# Patient Record
Sex: Male | Born: 1950 | Race: Black or African American | Hispanic: No | Smoking: Former smoker
Health system: Southern US, Community
[De-identification: ages and names within clinical notes are randomized; demographics above are authoritative.]

## PROBLEM LIST (undated history)

## (undated) DIAGNOSIS — K922 Gastrointestinal hemorrhage, unspecified: Secondary | ICD-10-CM

## (undated) DIAGNOSIS — J45909 Unspecified asthma, uncomplicated: Secondary | ICD-10-CM

## (undated) DIAGNOSIS — R569 Unspecified convulsions: Secondary | ICD-10-CM

## (undated) DIAGNOSIS — D649 Anemia, unspecified: Secondary | ICD-10-CM

## (undated) DIAGNOSIS — S37039A Laceration of unspecified kidney, unspecified degree, initial encounter: Secondary | ICD-10-CM

## (undated) DIAGNOSIS — K921 Melena: Secondary | ICD-10-CM

## (undated) HISTORY — DX: Unspecified convulsions: R56.9

## (undated) HISTORY — DX: Laceration of unspecified kidney, unspecified degree, initial encounter: S37.039A

## (undated) HISTORY — PX: KIDNEY SURGERY: SHX687

## (undated) HISTORY — DX: Gastrointestinal hemorrhage, unspecified: K92.2

## (undated) HISTORY — DX: Melena: K92.1

## (undated) HISTORY — DX: Anemia, unspecified: D64.9

## (undated) HISTORY — DX: Unspecified asthma, uncomplicated: J45.909

---

## 1997-06-29 DIAGNOSIS — S37039A Laceration of unspecified kidney, unspecified degree, initial encounter: Secondary | ICD-10-CM

## 1997-06-29 HISTORY — DX: Laceration of unspecified kidney, unspecified degree, initial encounter: S37.039A

## 2016-12-09 ENCOUNTER — Encounter: Payer: Self-pay | Admitting: Family Medicine

## 2016-12-09 ENCOUNTER — Ambulatory Visit (INDEPENDENT_AMBULATORY_CARE_PROVIDER_SITE_OTHER): Payer: Federal, State, Local not specified - PPO | Admitting: Family Medicine

## 2016-12-09 VITALS — BP 140/72 | HR 87 | Temp 98.1°F | Ht 73.0 in | Wt 161.4 lb

## 2016-12-09 DIAGNOSIS — Z8719 Personal history of other diseases of the digestive system: Secondary | ICD-10-CM

## 2016-12-09 DIAGNOSIS — D62 Acute posthemorrhagic anemia: Secondary | ICD-10-CM | POA: Diagnosis not present

## 2016-12-09 DIAGNOSIS — R55 Syncope and collapse: Secondary | ICD-10-CM | POA: Diagnosis not present

## 2016-12-09 LAB — COMPREHENSIVE METABOLIC PANEL
ALBUMIN: 3.8 g/dL (ref 3.5–5.2)
ALT: 14 U/L (ref 0–53)
AST: 17 U/L (ref 0–37)
Alkaline Phosphatase: 44 U/L (ref 39–117)
BUN: 19 mg/dL (ref 6–23)
CALCIUM: 9.3 mg/dL (ref 8.4–10.5)
CHLORIDE: 106 meq/L (ref 96–112)
CO2: 29 meq/L (ref 19–32)
Creatinine, Ser: 1.16 mg/dL (ref 0.40–1.50)
GFR: 81.1 mL/min (ref >60.00)
Glucose, Bld: 84 mg/dL (ref 70–99)
POTASSIUM: 4.1 meq/L (ref 3.5–5.1)
SODIUM: 140 meq/L (ref 135–145)
Total Bilirubin: 0.4 mg/dL (ref 0.2–1.2)
Total Protein: 6.1 g/dL (ref 6.0–8.3)

## 2016-12-09 LAB — CBC WITH DIFFERENTIAL/PLATELET
BASOS PCT: 0.7 % (ref 0.0–3.0)
Basophils Absolute: 0 10*3/uL (ref 0.0–0.1)
EOS ABS: 0.1 10*3/uL (ref 0.0–0.7)
EOS PCT: 2.1 % (ref 0.0–5.0)
HCT: 26.9 % — ABNORMAL LOW (ref 39.0–52.0)
Lymphocytes Relative: 32.8 % (ref 12.0–46.0)
Lymphs Abs: 1.5 10*3/uL (ref 0.7–4.0)
MCHC: 34.3 g/dL (ref 30.0–36.0)
MCV: 95.7 fl (ref 78.0–100.0)
MONO ABS: 0.4 10*3/uL (ref 0.1–1.0)
Monocytes Relative: 9.3 % (ref 3.0–12.0)
NEUTROS ABS: 2.6 10*3/uL (ref 1.4–7.7)
Neutrophils Relative %: 55.1 % (ref 43.0–77.0)
PLATELETS: 224 10*3/uL (ref 150.0–400.0)
RBC: 2.81 Mil/uL — ABNORMAL LOW (ref 4.22–5.81)
RDW: 15.5 % (ref 11.5–15.5)
WBC: 4.7 10*3/uL (ref 4.0–10.5)

## 2016-12-09 NOTE — Patient Instructions (Signed)
I am glad that you are feeling better!  We are going to get labs for you today- blood, and I will have you do some stool cards at home and return them to the office We will request records from the hospital in [place] I am going to refer you to a gastroenterologist asap for further evaluation If you are feeling weak, have bloody or black stools, or have any vomiting or significant belly pain or dizziness please seek care immediately!
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
## 2016-12-10 ENCOUNTER — Encounter: Payer: Self-pay | Admitting: Physician Assistant

## 2016-12-16 ENCOUNTER — Encounter: Payer: Self-pay | Admitting: Gastroenterology

## 2016-12-16 ENCOUNTER — Other Ambulatory Visit (INDEPENDENT_AMBULATORY_CARE_PROVIDER_SITE_OTHER): Payer: Federal, State, Local not specified - PPO

## 2016-12-16 ENCOUNTER — Ambulatory Visit (INDEPENDENT_AMBULATORY_CARE_PROVIDER_SITE_OTHER): Payer: Federal, State, Local not specified - PPO | Admitting: Physician Assistant

## 2016-12-16 ENCOUNTER — Encounter: Payer: Self-pay | Admitting: Physician Assistant

## 2016-12-16 VITALS — BP 120/74 | HR 72 | Ht 72.0 in | Wt 164.2 lb

## 2016-12-16 DIAGNOSIS — D649 Anemia, unspecified: Secondary | ICD-10-CM | POA: Diagnosis not present

## 2016-12-16 DIAGNOSIS — R55 Syncope and collapse: Secondary | ICD-10-CM | POA: Insufficient documentation

## 2016-12-16 DIAGNOSIS — R195 Other fecal abnormalities: Secondary | ICD-10-CM | POA: Diagnosis not present

## 2016-12-16 LAB — CBC WITH DIFFERENTIAL/PLATELET
Basophils Absolute: 0 10*3/uL (ref 0.0–0.1)
Basophils Relative: 0.6 % (ref 0.0–3.0)
EOS PCT: 3.1 % (ref 0.0–5.0)
Eosinophils Absolute: 0.2 10*3/uL (ref 0.0–0.7)
HCT: 30.6 % — ABNORMAL LOW (ref 39.0–52.0)
HEMOGLOBIN: 10.4 g/dL — AB (ref 13.0–17.0)
LYMPHS ABS: 1.9 10*3/uL (ref 0.7–4.0)
LYMPHS PCT: 34.4 % (ref 12.0–46.0)
MCHC: 34 g/dL (ref 30.0–36.0)
MCV: 95.9 fl (ref 78.0–100.0)
MONOS PCT: 7.7 % (ref 3.0–12.0)
Monocytes Absolute: 0.4 10*3/uL (ref 0.1–1.0)
Neutro Abs: 3 10*3/uL (ref 1.4–7.7)
Neutrophils Relative %: 54.2 % (ref 43.0–77.0)
Platelets: 300 10*3/uL (ref 150.0–400.0)
RBC: 3.19 Mil/uL — AB (ref 4.22–5.81)
RDW: 15.7 % — ABNORMAL HIGH (ref 11.5–15.5)
WBC: 5.5 10*3/uL (ref 4.0–10.5)

## 2016-12-16 MED ORDER — NA SULFATE-K SULFATE-MG SULF 17.5-3.13-1.6 GM/177ML PO SOLN: 1.0000 | Freq: Once | ORAL | 0 refills | Status: AC

## 2016-12-16 NOTE — Progress Notes (Signed)
Subjective:    Patient ID: Troy Johnson, male    DOB: [birth year], 66 y.o.   MRN: [id number]  HPI Troy Johnson is a pleasant 66 year old African-American male, new to GI today referred by Dr. Silvestre Mesi for evaluation of anemia. Patient has not had any prior GI evaluation. He just established care with Dr. Edilia Bo. He was visiting his daughter in [place] the week of June 8 and apparently had a syncopal episode on standing immediately after dinner. He apparently was witnessed to have loss of consciousness and then vomited. On admission he was noted to be anemic with a hemoglobin of 8.6. There was no evidence of overt GI bleeding but CT scan of the abdomen and pelvis done in [place] by Dr. Arlyn Dunning note showed possible proctitis and question inflammation of the seminal vesicles. Patient says he did not vomit any blood and had no obvious blood in his stool at any point prior to that hospitalization or since. He denies any melena. He was transfused 1 unit of packed RBCs and had hemoglobin of 9.4 on discharge from the hospital there. He did not have GI evaluation. He did have an echo done which was read as normal. Iron studies done during hospitalization showed an iron of 134 TIBC of 213 iron sat of 46 and a ferritin slightly low at 25. Since returning home he says he has been feeling fine but has remained out of work. 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 He denies any problems with abdominal discomfort, changes in bowel habits melena or hematochezia. No regular heartburn or indigestion no dysphagia or odynophagia. Appetite has been fine, weight has been stable. He denies any further dizziness or lightheadedness. He does take occasional Excedrin for body aches no other NSAID use. Family history negative for colon cancer polyps. Repeat labs on 12/09/2016 with hemoglobin of 9.2 hematocrit of 26.9 MCV of 95.7 see met within normal limits. Patient was given a Hemoccult  but had not done that as yet.  Review of Systems Pertinent positive and negative review of systems were noted in the above HPI section.  All other review of systems was otherwise negative.  Outpatient Encounter Prescriptions as of 12/16/2016  Medication Sig  . levofloxacin (LEVAQUIN) 500 MG tablet Take 1 tablet by mouth daily. For 7 days  . Na Sulfate-K Sulfate-Mg Sulf 17.5-3.13-1.6 GM/180ML SOLN Take 1 kit by mouth once.   No facility-administered encounter medications on file as of 12/16/2016.    Allergies  Allergen Reactions  . Penicillins     childhood   Patient Active Problem List   Diagnosis Date Noted  . H/O: GI bleed 12/09/2016   Social History   Social History  . Marital status: Married    Spouse name: N/A  . Number of children: N/A  . Years of education: N/A   Occupational History  . truck driver    Social History Main Topics  . Smoking status: Former Research scientist (life sciences)  . Smokeless tobacco: Never Used  . Alcohol use Yes     Comment: 1 beer/month  . Drug use: No  . Sexual activity: Not on file   Other Topics Concern  . Not on file   Social History Narrative  . No narrative on file    Troy Johnson family history includes Cervical cancer in his mother.      Objective:    Vitals:   12/16/16 1325  BP: 120/74  Pulse: 72  Physical Exam  well-developed older African-American male in no acute distress, accompanied by his wife, both pleasant blood pressure 120/74 pulse 72, height 6 foot, weight 164, BMI 22.2. HEENT ;nontraumatic normocephalic EOMI PERRLA, sclera anicteric, Cardiovascular; regular rate and rhythm with S1-S2 no murmur or gallop, Pulmonary; clear bilaterally, Abdomen; soft, nontender nondistended bowel sounds are active there is no palpable mass or hepatosplenomegaly, Rectal; exam external hemorrhoids present, stool is brown and Hemoccult positive, no lesion felt, Extremities; no clubbing cyanosis or edema skin warm and dry case, Neuropsych; mood and  affect appropriate       Assessment & Plan:   #52 66 year old African-American male with recent syncopal episode while out of town visiting his family in [place]. He was admitted to a hospital there, noted to be anemic with hemoglobin of 8.6 and transfused 1 unit of blood. No clear evidence of acute GI bleed though CT scan of the abdomen and pelvis done while hospitalized raise some concern for possible proctitis and/or inflammation in the seminal vesicles. Patient has not noted any melena or hematochezia prior to or after that hospitalization. He is not iron deficient. Patient is Hemoccult positive today  We will need to rule out slow GI blood loss secondary to occult upper versus lower GI lesion. Is not clear that he had a syncopal episode secondary to GI blood loss.  #2 normal echocardiogram done at time of recent hospitalization.   Plan; repeat CBC with differential today. We'll need to follow serially to assure stability Patient will be scheduled for colonoscopy and upper endoscopy with Dr. Silverio Decamp. Both procedures test discussed in detail with patient including risks and benefits and he is agreeable to proceed. He was advised to stop aspirin and Excedrin use for now and to use plain Tylenol as needed.  Harper Smoker S Briselda Naval PA-C 12/16/2016   Cc: Copland, Gay Filler, MD

## 2016-12-16 NOTE — Patient Instructions (Addendum)
Please go to the basement level to have your labs drawn.  No Aspirin or Excedrin. No Ibuprofen, Aleve, Advil. Take Tylenol.  You have been scheduled for a Colonoscopy and Endoscopy. Please follow written instructions given to you at your visit today.  Please pick up your prep supplies at the pharmacy within the next 1-3 days. If you use inhalers (even only as needed), please bring them with you on the day of your procedure. Your physician has requested that you go to www.startemmi.com and enter the access code given to you at your visit today. This web site gives a general overview about your procedure. However, you should still follow specific instructions given to you by our office regarding your preparation for the procedure.

## 2016-12-18 NOTE — Progress Notes (Signed)
Reviewed and agree with documentation and assessment and plan. K. Veena Masayuki Sakai , MD   

## 2016-12-25 ENCOUNTER — Encounter: Payer: Self-pay | Admitting: Gastroenterology

## 2016-12-25 ENCOUNTER — Ambulatory Visit (AMBULATORY_SURGERY_CENTER): Payer: Federal, State, Local not specified - PPO | Admitting: Gastroenterology

## 2016-12-25 VITALS — BP 131/79 | HR 68 | Temp 97.1°F | Resp 11 | Ht 72.0 in | Wt 164.0 lb

## 2016-12-25 DIAGNOSIS — K2970 Gastritis, unspecified, without bleeding: Secondary | ICD-10-CM | POA: Diagnosis not present

## 2016-12-25 DIAGNOSIS — D509 Iron deficiency anemia, unspecified: Secondary | ICD-10-CM | POA: Diagnosis not present

## 2016-12-25 DIAGNOSIS — D649 Anemia, unspecified: Secondary | ICD-10-CM

## 2016-12-25 MED ORDER — PANTOPRAZOLE SODIUM 40 MG PO TBEC: DELAYED_RELEASE_TABLET | ORAL | 2 refills | Status: DC

## 2016-12-25 MED ORDER — SODIUM CHLORIDE 0.9 % IV SOLN: 500.0000 mL | INTRAVENOUS | Status: DC

## 2016-12-25 NOTE — Patient Instructions (Signed)
NO ASPIRIN, ASPIRIN CONTAINING PRODUCTS (BC OR GOODY POWDERS) OR NSAIDS (IBUPROFEN, ADVIL, ALEVE, AND MOTRIN) , TYLENOL IS OK TO TAKE   Use Protonix 40 mg by mouth daily for 3 months Sent electronically to your pharmacy  for you to pick up  Await pathology results  Information on gastritis given to you today  Information on diverticulosis and hemorrhoids given to you today   YOU HAD AN ENDOSCOPIC PROCEDURE TODAY AT THE [facility]:   Refer to the procedure report that was given to you for any specific questions about what was found during the examination.  If the procedure report does not answer your questions, please call your gastroenterologist to clarify.  If you requested that your care partner not be given the details of your procedure findings, then the procedure report has been included in a sealed envelope for you to review at your convenience later.  YOU SHOULD EXPECT: Some feelings of bloating in the abdomen. Passage of more gas than usual.  Walking can help get rid of the air that was put into your GI tract during the procedure and reduce the bloating. If you had a lower endoscopy (such as a colonoscopy or flexible sigmoidoscopy) you may notice spotting of blood in your stool or on the toilet paper. If you underwent a bowel prep for your procedure, you may not have a normal bowel movement for a few days.  Please Note:  You might notice some irritation and congestion in your nose or some drainage.  This is from the oxygen used during your procedure.  There is no need for concern and it should clear up in a day or so.  SYMPTOMS TO REPORT IMMEDIATELY:   Following lower endoscopy (colonoscopy or flexible sigmoidoscopy):  Excessive amounts of blood in the stool  Significant tenderness or worsening of abdominal pains  Swelling of the abdomen that is new, acute  Fever of 100F or higher   Following upper endoscopy (EGD)  Vomiting of blood or coffee ground  material  New chest pain or pain under the shoulder blades  Painful or persistently difficult swallowing  New shortness of breath  Fever of 100F or higher  Black, tarry-looking stools  For urgent or emergent issues, a gastroenterologist can be reached at any hour by calling [phone].   DIET:  We do recommend a small meal at first, but then you may proceed to your regular diet.  Drink plenty of fluids but you should avoid alcoholic beverages for 24 hours.  ACTIVITY:  You should plan to take it easy for the rest of today and you should NOT DRIVE or use heavy machinery until tomorrow (because of the sedation medicines used during the test).    FOLLOW UP: Our staff will call the number listed on your records the next business day following your procedure to check on you and address any questions or concerns that you may have regarding the information given to you following your procedure. If we do not reach you, we will leave a message.  However, if you are feeling well and you are not experiencing any problems, there is no need to return our call.  We will assume that you have returned to your regular daily activities without incident.  If any biopsies were taken you will be contacted by phone or by letter within the next 1-3 weeks.  Please call us at [phone] if you have not heard about the biopsies in 3 weeks.  SIGNATURES/CONFIDENTIALITY: You and/or your care partner have signed paperwork which will be entered into your electronic medical record.  These signatures attest to the fact that that the information above on your After Visit Summary has been reviewed and is understood.  Full responsibility of the confidentiality of this discharge information lies with you and/or your care-partner.

## 2016-12-25 NOTE — Progress Notes (Signed)
1340 propofol 120mg  given

## 2016-12-25 NOTE — Op Note (Signed)
[facility] Patient Name: Troy Johnson Procedure Date: 12/25/2016 1:30 PM MRN: [id number] Endoscopist: Napoleon Form , MD Age: 66 Referring MD:  Date of Birth: 03-31-1951 Gender: Male Account #: [id number] Procedure:                Colonoscopy Indications:              Unexplained iron deficiency anemia Medicines:                Monitored Anesthesia Care Procedure:                Pre-Anesthesia Assessment:                           - Prior to the procedure, a History and Physical                            was performed, and patient medications and                            allergies were reviewed. The patient's tolerance of                            previous anesthesia was also reviewed. The risks                            and benefits of the procedure and the sedation                            options and risks were discussed with the patient.                            All questions were answered, and informed consent                            was obtained. Prior Anticoagulants: The patient has                            taken no previous anticoagulant or antiplatelet                            agents. ASA Grade Assessment: II - A patient with                            mild systemic disease. After reviewing the risks                            and benefits, the patient was deemed in                            satisfactory condition to undergo the procedure.                           After obtaining informed consent, the colonoscope  was passed under direct vision. Throughout the                            procedure, the patient's blood pressure, pulse, and                            oxygen saturations were monitored continuously. The                            Colonoscope was introduced through the anus and                            advanced to the the cecum, identified by                            appendiceal orifice and  ileocecal valve. The                            colonoscopy was performed without difficulty. The                            patient tolerated the procedure well. The quality                            of the bowel preparation was excellent. The                            ileocecal valve, appendiceal orifice, and rectum                            were photographed. Scope In: 1:48:57 PM Scope Out: 2:07:32 PM Scope Withdrawal Time: 0 hours 13 minutes 58 seconds  Total Procedure Duration: 0 hours 18 minutes 35 seconds  Findings:                 A few small-mouthed diverticula were found in the                            sigmoid colon, descending colon and transverse                            colon.                           The perianal and digital rectal examinations were                            normal.                           Non-bleeding internal hemorrhoids were found during                            retroflexion. The hemorrhoids were medium-sized.  The exam was otherwise without abnormality. Complications:            No immediate complications. Estimated Blood Loss:     Estimated blood loss: none. Impression:               - Diverticulosis in the sigmoid colon, in the                            descending colon and in the transverse colon.                           - Non-bleeding internal hemorrhoids.                           - The examination was otherwise normal.                           - No specimens collected. Recommendation:           - Patient has a contact number available for                            emergencies. The signs and symptoms of potential                            delayed complications were discussed with the                            patient. Return to normal activities tomorrow.                            Written discharge instructions were provided to the                            patient.                           - Resume  previous diet.                           - Continue present medications.                           - Repeat colonoscopy in 10 years for screening                            purposes. Napoleon FormKavitha V. Jerol Rufener, MD 12/25/2016 2:20:23 PM This report has been signed electronically.

## 2016-12-25 NOTE — Progress Notes (Signed)
Called to room to assist during endoscopic procedure.  Patient ID and intended procedure confirmed with present staff. Received instructions for my participation in the procedure from the performing physician.  

## 2016-12-25 NOTE — Progress Notes (Signed)
A/ox3 pleased with MAC, report to Penny RN 

## 2016-12-25 NOTE — Op Note (Signed)
[facility] Patient Name: Troy Johnson Procedure Date: 12/25/2016 1:31 PM MRN: [id number] Endoscopist: Napoleon Form , MD Age: 66 Referring MD:  Date of Birth: 25-Jul-1950 Gender: Male Account #: [id number] Procedure:                Upper GI endoscopy Indications:              Suspected upper gastrointestinal bleeding in                            patient with unexplained iron deficiency anemia Medicines:                Monitored Anesthesia Care Procedure:                Pre-Anesthesia Assessment:                           - Prior to the procedure, a History and Physical                            was performed, and patient medications and                            allergies were reviewed. The patient's tolerance of                            previous anesthesia was also reviewed. The risks                            and benefits of the procedure and the sedation                            options and risks were discussed with the patient.                            All questions were answered, and informed consent                            was obtained. Prior Anticoagulants: The patient has                            taken no previous anticoagulant or antiplatelet                            agents. ASA Grade Assessment: II - A patient with                            mild systemic disease. After reviewing the risks                            and benefits, the patient was deemed in                            satisfactory condition to undergo the procedure.  After obtaining informed consent, the endoscope was                            passed under direct vision. Throughout the                            procedure, the patient's blood pressure, pulse, and                            oxygen saturations were monitored continuously. The                            Endoscope was introduced through the mouth, and                            advanced  to the second part of duodenum. The upper                            GI endoscopy was accomplished without difficulty.                            The patient tolerated the procedure well. Scope In: Scope Out: Findings:                 One mild (non-circumferential scarring)                            benign-appearing, intrinsic stenosis was found 34                            to 35 cm from the incisors. This measured greater                            than or equal to 3 cm (inner diameter) x 1 cm (in                            length) and was traversed.                           A small hiatal hernia was present.                           Multiple non-bleeding localized erosions were found                            in the gastric antrum. There were no stigmata of                            recent bleeding.                           Scattered severe inflammation characterized by                            congestion (edema), erosions, erythema and  friability was found in the entire examined                            stomach. Biopsies were taken with a cold forceps                            for Helicobacter pylori testing.                           The examined duodenum was normal. Complications:            No immediate complications. Estimated Blood Loss:     Estimated blood loss was minimal. Impression:               - Benign-appearing esophageal stenosis.                           - Small hiatal hernia.                           - Gastric erosions without bleeding.                           - Gastritis. Biopsied.                           - Normal examined duodenum. Recommendation:           - Patient has a contact number available for                            emergencies. The signs and symptoms of potential                            delayed complications were discussed with the                            patient. Return to normal activities tomorrow.                             Written discharge instructions were provided to the                            patient.                           - Resume previous diet.                           - Continue present medications.                           - Await pathology results.                           - No aspirin, ibuprofen, naproxen, or other  non-steroidal anti-inflammatory drugs.                           - Use Protonix (pantoprazole) 40 mg PO daily for 3                            months. Napoleon Form, MD 12/25/2016 2:16:44 PM This report has been signed electronically.

## 2016-12-28 ENCOUNTER — Telehealth: Payer: Self-pay | Admitting: *Deleted

## 2016-12-28 NOTE — Telephone Encounter (Signed)
  Follow up Call-  Call back number 12/25/2016  Post procedure Call Back phone  # [phone]  Permission to leave phone message Yes     Patient questions:  Do you have a fever, pain , or abdominal swelling? No. Pain Score  0 *  Have you tolerated food without any problems? No.  Have you been able to return to your normal activities? Yes.    Do you have any questions about your discharge instructions: Diet   No. Medications  No. Follow up visit  No.  Do you have questions or concerns about your Care? No.  Actions: * If pain score is 4 or above: No action needed, pain <4.

## 2017-01-07 ENCOUNTER — Other Ambulatory Visit: Payer: Self-pay

## 2017-01-07 DIAGNOSIS — A048 Other specified bacterial intestinal infections: Secondary | ICD-10-CM

## 2017-01-07 MED ORDER — PANTOPRAZOLE SODIUM 40 MG PO TBEC: 40.0000 mg | DELAYED_RELEASE_TABLET | Freq: Two times a day (BID) | ORAL | 0 refills | Status: DC

## 2017-01-07 MED ORDER — BIS SUBCIT-METRONID-TETRACYC 140-125-125 MG PO CAPS: 3.0000 | ORAL_CAPSULE | Freq: Three times a day (TID) | ORAL | 0 refills | Status: DC

## 2017-01-08 ENCOUNTER — Other Ambulatory Visit: Payer: Self-pay

## 2017-01-11 ENCOUNTER — Telehealth: Payer: Self-pay | Admitting: Gastroenterology

## 2017-01-11 NOTE — Telephone Encounter (Signed)
Patient's wife states the Pylera is too expensive and was wondering there was something else her husband can take in it's place. Informed patient we have a sample of the Pylera if she can pick it up from our office. Patient's wife states she will come by and pick it up today. Informed patient that this is the entire 10 day treatment. Patient's wife verbalized understanding.

## 2017-02-07 ENCOUNTER — Other Ambulatory Visit: Payer: Self-pay | Admitting: Gastroenterology

## 2017-02-16 NOTE — Progress Notes (Signed)
[facility] [address], [address] [place], [place] [address] [phone]  Date:  02/18/2017   Name:  Troy Johnson   DOB:  [birth year]   MRN:  [id number]  PCP:  [name], MD    Chief Complaint: Follow-up   History of Present Illness:  Troy Johnson is a 66 y.o. very pleasant male patient who presents with the following:  I saw him in June for a GI bleed which had occurred while he was in [place].  He has already seen GI, had an upper GI and colonoscopy in late June.    His energy is coming back  He has not had any blood in his stool, no dark tarry stools   He did do a round of H pylori treatment in July  He was not given any particular GI follow-up; he plans to do a colonoscopy in about 5 years  Overall he has no concerns and is doing well Would like to do routine labs soon as well Weight is stable- he is eating plenty, but states that he tends to maintain his weight no matter what he eats     Wt Readings from Last 3 Encounters:  02/18/17 164 lb (74.4 kg)  12/25/16 164 lb (74.4 kg)  12/16/16 164 lb 3.2 oz (74.5 kg)     Patient Active Problem List   Diagnosis Date Noted  . Anemia 12/16/2016  . Syncope 12/16/2016  . H/O: GI bleed 12/09/2016    Past Medical History:  Diagnosis Date  . Anemia   . Blood in stool   . Childhood asthma   . GI bleed   . Kidney laceration     Past Surgical History:  Procedure Laterality Date  . KIDNEY SURGERY     for laceration    Social History  Substance Use Topics  . Smoking status: Former Games developer  . Smokeless tobacco: Never Used  . Alcohol use Yes     Comment: 1 beer/month    Family History  Problem Relation Age of Onset  . Cervical cancer Mother   . Colon cancer Neg Hx   . Pancreatic cancer Neg Hx   . Esophageal cancer Neg Hx   . Stomach cancer Neg Hx     Allergies  Allergen Reactions  . Penicillins     childhood    Medication list has been  reviewed and updated.  Current Outpatient Prescriptions on File Prior to Visit  Medication Sig Dispense Refill  . bismuth-metronidazole-tetracycline (PYLERA) 140-125-125 MG capsule Take 3 capsules by mouth 4 (four) times daily -  before meals and at bedtime. (Patient not taking: Reported on 02/18/2017) 120 capsule 0  . pantoprazole (PROTONIX) 40 MG tablet TAKE 1 TABLET (40 MG TOTAL) BY MOUTH 2 (TWO) TIMES DAILY BEFORE A MEAL. TAKE FOR 4 WEEKS THEN STOP (Patient not taking: Reported on 02/18/2017) 60 tablet 0   Current Facility-Administered Medications on File Prior to Visit  Medication Dose Route Frequency Provider Last Rate Last Dose  . 0.9 %  sodium chloride infusion  500 mL Intravenous Continuous Nandigam, Eleonore Chiquito, MD        Review of Systems:  As per HPI- otherwise negative.   Physical Examination: Vitals:   02/18/17 1805  BP: 126/70  Pulse: 70  Resp: 14  Temp: 98 F (36.7 C)  SpO2: 97%   Vitals:   02/18/17 1805  Weight: 164 lb (74.4 kg)  Height: 6' (1.829 m)  Body mass index is 22.24 kg/m. Ideal Body Weight: Weight in (lb) to have BMI = 25: 183.9  GEN: WDWN, NAD, Non-toxic, A & O x 3, looks well HEENT: Atraumatic, Normocephalic. Neck supple. No masses, No LAD.  Bilateral TM wnl, oropharynx normal.  PEERL,EOMI.   conjunctiva are pink not pale Ears and Nose: No external deformity. CV: RRR, No M/G/R. No JVD. No thrill. No extra heart sounds. PULM: CTA B, no wheezes, crackles, rhonchi. No retractions. No resp. distress. No accessory muscle use. ABD: S, NT, ND, +BS. No rebound. No HSM.  Benign belly EXTR: No c/c/e NEURO Normal gait.  PSYCH: Normally interactive. Conversant. Not depressed or anxious appearing.  Calm demeanor.    Assessment and Plan: H/O: GI bleed  Anemia associated with acute blood loss - Plan: CBC  Screening for hyperlipidemia - Plan: Lipid panel  Screening for prostate cancer - Plan: PSA, Medicare  Screening for diabetes mellitus - Plan:  Comprehensive metabolic panel  Follow-up on recent GI bleed He is doing well, no recurrent sx Plan for him to come in for fasting labs at his conveneince  Signed Abbe Amsterdam, MD

## 2017-02-18 ENCOUNTER — Ambulatory Visit (INDEPENDENT_AMBULATORY_CARE_PROVIDER_SITE_OTHER): Payer: Federal, State, Local not specified - PPO | Admitting: Family Medicine

## 2017-02-18 ENCOUNTER — Encounter: Payer: Self-pay | Admitting: Family Medicine

## 2017-02-18 VITALS — BP 126/70 | HR 70 | Temp 98.0°F | Resp 14 | Ht 72.0 in | Wt 164.0 lb

## 2017-02-18 DIAGNOSIS — Z131 Encounter for screening for diabetes mellitus: Secondary | ICD-10-CM | POA: Diagnosis not present

## 2017-02-18 DIAGNOSIS — Z125 Encounter for screening for malignant neoplasm of prostate: Secondary | ICD-10-CM | POA: Diagnosis not present

## 2017-02-18 DIAGNOSIS — Z13220 Encounter for screening for lipoid disorders: Secondary | ICD-10-CM

## 2017-02-18 DIAGNOSIS — Z8719 Personal history of other diseases of the digestive system: Secondary | ICD-10-CM | POA: Diagnosis not present

## 2017-02-18 DIAGNOSIS — D62 Acute posthemorrhagic anemia: Secondary | ICD-10-CM | POA: Diagnosis not present

## 2017-02-18 NOTE — Patient Instructions (Signed)
It was good to see you today- so glad you are feeling better!   Please come and see [place] for labs at your convenience in the next few weeks- come in the morning, fasting (water is ok!)

## 2019-11-22 ENCOUNTER — Encounter: Payer: Self-pay | Admitting: *Deleted

## 2019-11-24 ENCOUNTER — Other Ambulatory Visit: Payer: Self-pay

## 2019-11-24 ENCOUNTER — Ambulatory Visit (INDEPENDENT_AMBULATORY_CARE_PROVIDER_SITE_OTHER): Payer: Federal, State, Local not specified - PPO | Admitting: Diagnostic Neuroimaging

## 2019-11-24 ENCOUNTER — Encounter: Payer: Self-pay | Admitting: Diagnostic Neuroimaging

## 2019-11-24 ENCOUNTER — Telehealth: Payer: Self-pay | Admitting: Diagnostic Neuroimaging

## 2019-11-24 VITALS — BP 147/88 | HR 72 | Ht 72.0 in | Wt 170.4 lb

## 2019-11-24 DIAGNOSIS — R569 Unspecified convulsions: Secondary | ICD-10-CM | POA: Diagnosis not present

## 2019-11-24 NOTE — Patient Instructions (Signed)
  NEW ONSET SEIZURE (11/08/19)  - check MRI brain, EEG  - check A1c, TSH, B12  - According to [place] law, you can not drive unless you are seizure / syncope free for at least 6 months and under physician's care.   - Please maintain precautions. Do not participate in activities where a loss of awareness could harm you or someone else. No swimming alone, no tub bathing, no hot tubs, no driving, no operating motorized vehicles (cars, ATVs, motocycles, etc), lawnmowers, power tools or firearms. No standing at heights, such as rooftops, ladders or stairs. Avoid hot objects such as stoves, heaters, open fires. Wear a helmet when riding a bicycle, scooter, skateboard, etc. and avoid areas of traffic. Set your water heater to 120 degrees or less.

## 2019-11-24 NOTE — Telephone Encounter (Signed)
Medicare/bcbs fed order sent to [facility]. I sent Eber Jones a message she will reach out to the patient to schedule.
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
## 2019-11-25 LAB — COMPREHENSIVE METABOLIC PANEL
ALT: 20 IU/L (ref 0–44)
AST: 18 IU/L (ref 0–40)
Albumin/Globulin Ratio: 1.7 (ref 1.2–2.2)
Albumin: 4.3 g/dL (ref 3.8–4.8)
Alkaline Phosphatase: 76 IU/L (ref 48–121)
BUN/Creatinine Ratio: 14 (ref 10–24)
BUN: 16 mg/dL (ref 8–27)
Bilirubin Total: 0.3 mg/dL (ref 0.0–1.2)
CO2: 26 mmol/L (ref 20–29)
Calcium: 10.4 mg/dL — ABNORMAL HIGH (ref 8.6–10.2)
Chloride: 102 mmol/L (ref 96–106)
Creatinine, Ser: 1.12 mg/dL (ref 0.76–1.27)
GFR calc Af Amer: 78 mL/min/{1.73_m2} (ref >59)
GFR calc non Af Amer: 67 mL/min/{1.73_m2} (ref >59)
Globulin, Total: 2.6 g/dL (ref 1.5–4.5)
Glucose: 87 mg/dL (ref 65–99)
Potassium: 5.3 mmol/L — ABNORMAL HIGH (ref 3.5–5.2)
Sodium: 143 mmol/L (ref 134–144)
Total Protein: 6.9 g/dL (ref 6.0–8.5)

## 2019-11-25 LAB — CBC WITH DIFFERENTIAL/PLATELET
Basophils Absolute: 0 10*3/uL (ref 0.0–0.2)
Basos: 1 %
EOS (ABSOLUTE): 0.2 10*3/uL (ref 0.0–0.4)
Eos: 4 %
Hematocrit: 38.7 % (ref 37.5–51.0)
Hemoglobin: 12.9 g/dL — ABNORMAL LOW (ref 13.0–17.7)
Immature Grans (Abs): 0 10*3/uL (ref 0.0–0.1)
Immature Granulocytes: 0 %
Lymphocytes Absolute: 1.6 10*3/uL (ref 0.7–3.1)
Lymphs: 34 %
MCH: 31.9 pg (ref 26.6–33.0)
MCHC: 33.3 g/dL (ref 31.5–35.7)
MCV: 96 fL (ref 79–97)
Monocytes Absolute: 0.5 10*3/uL (ref 0.1–0.9)
Monocytes: 10 %
Neutrophils Absolute: 2.4 10*3/uL (ref 1.4–7.0)
Neutrophils: 51 %
Platelets: 321 10*3/uL (ref 150–450)
RBC: 4.05 x10E6/uL — ABNORMAL LOW (ref 4.14–5.80)
RDW: 14.4 % (ref 11.6–15.4)
WBC: 4.7 10*3/uL (ref 3.4–10.8)

## 2019-11-25 LAB — HEMOGLOBIN A1C
Est. average glucose Bld gHb Est-mCnc: 126 mg/dL
Hgb A1c MFr Bld: 6 % — ABNORMAL HIGH (ref 4.8–5.6)

## 2019-11-28 NOTE — Telephone Encounter (Signed)
Scheduled at [facility] for 12/02/19.

## 2019-12-02 ENCOUNTER — Ambulatory Visit (HOSPITAL_BASED_OUTPATIENT_CLINIC_OR_DEPARTMENT_OTHER): Payer: Federal, State, Local not specified - PPO

## 2019-12-06 ENCOUNTER — Telehealth: Payer: Self-pay | Admitting: *Deleted

## 2019-12-06 NOTE — Telephone Encounter (Signed)
Spoke with patient and informed his labs showed his sugar, potassium and calcium are slightly up. Dr Marjory Lies recommends his PCP monitor and that he follow up with PCP. I advised a copy of labs was sent to Dr Shela Commons Copland. Patient stated she is his wife's PCP; he has never been to a PCP. I gave him her name, office #. He stated he would call and FU on labs, make apt to see her. Patient verbalized understanding, appreciation.

## 2019-12-09 ENCOUNTER — Ambulatory Visit (HOSPITAL_BASED_OUTPATIENT_CLINIC_OR_DEPARTMENT_OTHER)
Admission: RE | Admit: 2019-12-09 | Discharge: 2019-12-09 | Disposition: A | Discharge status: Home / Self Care | Payer: Federal, State, Local not specified - PPO | Source: Ambulatory Visit | Attending: Diagnostic Neuroimaging | Admitting: Diagnostic Neuroimaging

## 2019-12-09 ENCOUNTER — Other Ambulatory Visit: Payer: Self-pay

## 2019-12-09 DIAGNOSIS — R569 Unspecified convulsions: Secondary | ICD-10-CM | POA: Insufficient documentation

## 2019-12-09 MED ORDER — GADOBUTROL 1 MMOL/ML IV SOLN
7.5000 mL | Freq: Once | INTRAVENOUS | Status: AC | PRN
  Administered 2019-12-09: 7.5 mL via INTRAVENOUS

## 2019-12-10 DIAGNOSIS — R7303 Prediabetes: Secondary | ICD-10-CM | POA: Insufficient documentation
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
## 2019-12-13 ENCOUNTER — Ambulatory Visit: Payer: Federal, State, Local not specified - PPO | Admitting: Family Medicine

## 2019-12-13 ENCOUNTER — Other Ambulatory Visit: Payer: Self-pay

## 2019-12-13 ENCOUNTER — Telehealth: Payer: Self-pay | Admitting: *Deleted

## 2019-12-13 ENCOUNTER — Encounter: Payer: Self-pay | Admitting: Family Medicine

## 2019-12-13 VITALS — BP 160/90 | HR 80 | Temp 97.0°F | Resp 16 | Ht 72.0 in | Wt 172.0 lb

## 2019-12-13 DIAGNOSIS — Z09 Encounter for follow-up examination after completed treatment for conditions other than malignant neoplasm: Secondary | ICD-10-CM | POA: Diagnosis not present

## 2019-12-13 DIAGNOSIS — R7303 Prediabetes: Secondary | ICD-10-CM | POA: Diagnosis not present

## 2019-12-13 DIAGNOSIS — Z125 Encounter for screening for malignant neoplasm of prostate: Secondary | ICD-10-CM

## 2019-12-13 DIAGNOSIS — R569 Unspecified convulsions: Secondary | ICD-10-CM

## 2019-12-13 DIAGNOSIS — Z13220 Encounter for screening for lipoid disorders: Secondary | ICD-10-CM

## 2019-12-13 LAB — LIPID PANEL
Cholesterol: 319 mg/dL — ABNORMAL HIGH (ref 0–200)
HDL: 52.7 mg/dL (ref >39.00)
LDL Cholesterol: 240 mg/dL — ABNORMAL HIGH (ref 0–99)
NonHDL: 266.1
Total CHOL/HDL Ratio: 6
Triglycerides: 130 mg/dL (ref 0.0–149.0)
VLDL: 26 mg/dL (ref 0.0–40.0)

## 2019-12-13 LAB — PSA: PSA: 1.27 ng/mL (ref 0.10–4.00)
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
## 2019-12-13 NOTE — Patient Instructions (Signed)
It was great to see you today- I am sorry that you had this seizure!  Let me know if I can do anything to help you You do have pre-diabetes; this means your blood sugar is a bit high, you may be at higher risk of developing diabetes later on.   Continue to maintain a healthy weight, and stay active.  I would recommend 45- 60 minutes of exercise at least 5 times a week. Walking is fine! Watch your carbs- try to eat less bread/ rice/ pasta/ potatoes and more colorful veggies and protein  Continue to monitor your BP at home I would recommend that you get your pneumonia and tetanus vaccines at your convenience- these can also be given at your pharmacy!    Take care and please see me in about 6 months

## 2019-12-13 NOTE — Telephone Encounter (Signed)
Per phone staff, patient called back and confirmed EEG apt.

## 2019-12-21 ENCOUNTER — Ambulatory Visit (INDEPENDENT_AMBULATORY_CARE_PROVIDER_SITE_OTHER): Payer: Federal, State, Local not specified - PPO | Admitting: Neurology

## 2019-12-21 ENCOUNTER — Other Ambulatory Visit: Payer: Self-pay

## 2019-12-21 DIAGNOSIS — R569 Unspecified convulsions: Secondary | ICD-10-CM | POA: Diagnosis not present

## 2020-01-01 NOTE — Procedures (Signed)
   HISTORY: 69 year old male presented with new onset seizure  TECHNIQUE:  This is a routine 16 channel EEG recording with one channel devoted to a limited EKG recording.  It was performed during wakefulness, drowsiness and asleep.  Hyperventilation and photic stimulation were performed as activating procedures.  There are minimum muscle and movement artifact noted.  Upon maximum arousal, posterior dominant waking rhythm consistent of rhythmic alpha range activity, with mixed alpha and beta range activity. Activities are symmetric over the bilateral posterior derivations and attenuated with eye opening.  Hyperventilation produced mild/moderate buildup with higher amplitude and the slower activities noted.  Photic stimulation did not alter the tracing.  During EEG recording, patient developed drowsiness and no deeper stage of sleep was achieved  During EEG recording, there was no epileptiform discharge noted.  EKG demonstrate sinus rhythm, with heart rate of 76 bpm  CONCLUSION: This is a  normal awake EEG.  There is no electrodiagnostic evidence of epileptiform discharge.  Levert Feinstein, M.D. Ph.D.  [facility] [address] [place], [place] [address] Phone: [phone] Fax:      [phone]

## 2020-01-03 ENCOUNTER — Telehealth: Payer: Self-pay | Admitting: Diagnostic Neuroimaging

## 2020-01-03 NOTE — Telephone Encounter (Signed)
Called patient on # given, no answer and voice MB full.

## 2020-01-03 NOTE — Telephone Encounter (Signed)
Nicole @ [facility] called stating pt reached out to them asking that they call [facility] for the results to his EEG.  Please call with results.  Joni Reining stated pt can be reached at [phone]

## 2020-01-04 NOTE — Telephone Encounter (Signed)
Called patient and informed him the EEG was normal. Advised the [place] DMV law still applies re: no driving until syncope, seizure free x 6 months. He verbalized understanding, appreciation.

## 2020-02-07 ENCOUNTER — Telehealth: Payer: Self-pay | Admitting: *Deleted

## 2020-02-07 ENCOUNTER — Encounter: Payer: Self-pay | Admitting: *Deleted

## 2020-02-07 NOTE — Telephone Encounter (Signed)
Pt called, need a letter or note for his job stating his test results were normal. Please call [phone]

## 2020-02-08 NOTE — Telephone Encounter (Signed)
Spoke with patient and informed him Dr Marjory Lies is out of the office until Monday. I will have him address patient's request when he returns. Patient verbalized understanding, appreciation.

## 2020-02-13 NOTE — Telephone Encounter (Signed)
Pt called again to find out the update on this request. Pt was informed that we are waiting on the provider's answer.

## 2020-02-14 NOTE — Telephone Encounter (Signed)
Cannot drive until 6 months seizure free. -VRP

## 2020-02-15 ENCOUNTER — Encounter: Payer: Self-pay | Admitting: *Deleted

## 2020-02-15 NOTE — Telephone Encounter (Signed)
Called patient and advised him of Dr [employer] message. We discussed [place] DMV law. He stated that he is hoping his job can make accommodations for him, so he can continue to work instead of taking leave. We discussed a letter stating such. He would like to pick up the letter today at 4 pm. I advised him Dr Marjory Lies will write letter for him. He  verbalized understanding, appreciation. Letter on MD's desk for review, signature.

## 2020-02-15 NOTE — Telephone Encounter (Signed)
Letter signed, at front desk for patient to pick up.

## 2020-05-28 ENCOUNTER — Ambulatory Visit: Payer: Medicare Other | Admitting: Diagnostic Neuroimaging

## 2020-07-01 ENCOUNTER — Ambulatory Visit: Payer: Medicare Other | Admitting: Diagnostic Neuroimaging

## 2020-07-16 ENCOUNTER — Ambulatory Visit: Payer: Medicare Other | Admitting: Diagnostic Neuroimaging

## 2020-10-28 ENCOUNTER — Other Ambulatory Visit: Payer: Self-pay

## 2020-10-28 ENCOUNTER — Emergency Department (HOSPITAL_COMMUNITY): Payer: Medicare Other

## 2020-10-28 ENCOUNTER — Inpatient Hospital Stay (HOSPITAL_COMMUNITY)
Admission: EM | Admit: 2020-10-28 | Discharge: 2020-11-14 | DRG: 233 | Disposition: A | Discharge status: Home / Self Care | Payer: Medicare Other | Source: Ambulatory Visit | Attending: Thoracic Surgery (Cardiothoracic Vascular Surgery) | Admitting: Thoracic Surgery (Cardiothoracic Vascular Surgery)

## 2020-10-28 ENCOUNTER — Encounter (HOSPITAL_COMMUNITY): Payer: Self-pay

## 2020-10-28 DIAGNOSIS — I472 Ventricular tachycardia: Secondary | ICD-10-CM | POA: Diagnosis present

## 2020-10-28 DIAGNOSIS — I959 Hypotension, unspecified: Secondary | ICD-10-CM | POA: Diagnosis not present

## 2020-10-28 DIAGNOSIS — I25110 Atherosclerotic heart disease of native coronary artery with unstable angina pectoris: Secondary | ICD-10-CM | POA: Diagnosis not present

## 2020-10-28 DIAGNOSIS — I509 Heart failure, unspecified: Secondary | ICD-10-CM | POA: Diagnosis present

## 2020-10-28 DIAGNOSIS — I5041 Acute combined systolic (congestive) and diastolic (congestive) heart failure: Secondary | ICD-10-CM | POA: Diagnosis not present

## 2020-10-28 DIAGNOSIS — Z951 Presence of aortocoronary bypass graft: Secondary | ICD-10-CM

## 2020-10-28 DIAGNOSIS — I255 Ischemic cardiomyopathy: Secondary | ICD-10-CM | POA: Diagnosis present

## 2020-10-28 DIAGNOSIS — I130 Hypertensive heart and chronic kidney disease with heart failure and stage 1 through stage 4 chronic kidney disease, or unspecified chronic kidney disease: Secondary | ICD-10-CM | POA: Diagnosis present

## 2020-10-28 DIAGNOSIS — J90 Pleural effusion, not elsewhere classified: Secondary | ICD-10-CM

## 2020-10-28 DIAGNOSIS — E782 Mixed hyperlipidemia: Secondary | ICD-10-CM | POA: Diagnosis present

## 2020-10-28 DIAGNOSIS — I214 Non-ST elevation (NSTEMI) myocardial infarction: Secondary | ICD-10-CM | POA: Insufficient documentation

## 2020-10-28 DIAGNOSIS — I2510 Atherosclerotic heart disease of native coronary artery without angina pectoris: Secondary | ICD-10-CM | POA: Diagnosis present

## 2020-10-28 DIAGNOSIS — Z681 Body mass index (BMI) 19 or less, adult: Secondary | ICD-10-CM

## 2020-10-28 DIAGNOSIS — D6959 Other secondary thrombocytopenia: Secondary | ICD-10-CM | POA: Diagnosis not present

## 2020-10-28 DIAGNOSIS — N179 Acute kidney failure, unspecified: Secondary | ICD-10-CM | POA: Diagnosis not present

## 2020-10-28 DIAGNOSIS — I513 Intracardiac thrombosis, not elsewhere classified: Secondary | ICD-10-CM | POA: Diagnosis present

## 2020-10-28 DIAGNOSIS — D62 Acute posthemorrhagic anemia: Secondary | ICD-10-CM | POA: Diagnosis not present

## 2020-10-28 DIAGNOSIS — E875 Hyperkalemia: Secondary | ICD-10-CM | POA: Diagnosis present

## 2020-10-28 DIAGNOSIS — E440 Moderate protein-calorie malnutrition: Secondary | ICD-10-CM | POA: Diagnosis present

## 2020-10-28 DIAGNOSIS — D638 Anemia in other chronic diseases classified elsewhere: Secondary | ICD-10-CM | POA: Diagnosis present

## 2020-10-28 DIAGNOSIS — D72819 Decreased white blood cell count, unspecified: Secondary | ICD-10-CM | POA: Diagnosis not present

## 2020-10-28 DIAGNOSIS — Z8249 Family history of ischemic heart disease and other diseases of the circulatory system: Secondary | ICD-10-CM | POA: Diagnosis not present

## 2020-10-28 DIAGNOSIS — I5020 Unspecified systolic (congestive) heart failure: Secondary | ICD-10-CM | POA: Diagnosis not present

## 2020-10-28 DIAGNOSIS — N182 Chronic kidney disease, stage 2 (mild): Secondary | ICD-10-CM | POA: Diagnosis present

## 2020-10-28 DIAGNOSIS — R0602 Shortness of breath: Secondary | ICD-10-CM | POA: Diagnosis not present

## 2020-10-28 DIAGNOSIS — I493 Ventricular premature depolarization: Secondary | ICD-10-CM | POA: Diagnosis present

## 2020-10-28 DIAGNOSIS — I340 Nonrheumatic mitral (valve) insufficiency: Secondary | ICD-10-CM | POA: Diagnosis present

## 2020-10-28 DIAGNOSIS — I5082 Biventricular heart failure: Secondary | ICD-10-CM | POA: Diagnosis present

## 2020-10-28 DIAGNOSIS — I5021 Acute systolic (congestive) heart failure: Secondary | ICD-10-CM | POA: Diagnosis not present

## 2020-10-28 DIAGNOSIS — Z01810 Encounter for preprocedural cardiovascular examination: Secondary | ICD-10-CM | POA: Diagnosis not present

## 2020-10-28 DIAGNOSIS — E7800 Pure hypercholesterolemia, unspecified: Secondary | ICD-10-CM | POA: Diagnosis not present

## 2020-10-28 DIAGNOSIS — Z87891 Personal history of nicotine dependence: Secondary | ICD-10-CM

## 2020-10-28 DIAGNOSIS — R0600 Dyspnea, unspecified: Secondary | ICD-10-CM | POA: Diagnosis not present

## 2020-10-28 DIAGNOSIS — Z20822 Contact with and (suspected) exposure to covid-19: Secondary | ICD-10-CM | POA: Diagnosis present

## 2020-10-28 DIAGNOSIS — E785 Hyperlipidemia, unspecified: Secondary | ICD-10-CM | POA: Diagnosis present

## 2020-10-28 DIAGNOSIS — I5043 Acute on chronic combined systolic (congestive) and diastolic (congestive) heart failure: Secondary | ICD-10-CM | POA: Diagnosis present

## 2020-10-28 DIAGNOSIS — Z9889 Other specified postprocedural states: Secondary | ICD-10-CM

## 2020-10-28 DIAGNOSIS — I5023 Acute on chronic systolic (congestive) heart failure: Secondary | ICD-10-CM | POA: Diagnosis not present

## 2020-10-28 LAB — TROPONIN I (HIGH SENSITIVITY)
Troponin I (High Sensitivity): 656 ng/L (ref <18)
Troponin I (High Sensitivity): 746 ng/L (ref <18)

## 2020-10-28 LAB — BASIC METABOLIC PANEL
Anion gap: 9 (ref 5–15)
Anion gap: 11 (ref 5–15)
BUN: 20 mg/dL (ref 8–23)
BUN: 17 mg/dL (ref 8–23)
CO2: 21 mmol/L — ABNORMAL LOW (ref 22–32)
CO2: 20 mmol/L — ABNORMAL LOW (ref 22–32)
Calcium: 8.9 mg/dL (ref 8.9–10.3)
Calcium: 9.4 mg/dL (ref 8.9–10.3)
Chloride: 106 mmol/L (ref 98–111)
Chloride: 108 mmol/L (ref 98–111)
Creatinine, Ser: 1.23 mg/dL (ref 0.61–1.24)
Creatinine, Ser: 1.21 mg/dL (ref 0.61–1.24)
GFR, Estimated: >60 mL/min (ref >60)
GFR, Estimated: >60 mL/min (ref >60)
Glucose, Bld: 106 mg/dL — ABNORMAL HIGH (ref 70–99)
Glucose, Bld: 92 mg/dL (ref 70–99)
Potassium: 5.9 mmol/L — ABNORMAL HIGH (ref 3.5–5.1)
Potassium: 4.2 mmol/L (ref 3.5–5.1)
Sodium: 136 mmol/L (ref 135–145)
Sodium: 139 mmol/L (ref 135–145)

## 2020-10-28 LAB — CBC WITH DIFFERENTIAL/PLATELET
Abs Immature Granulocytes: 0.01 10*3/uL (ref 0.00–0.07)
Basophils Absolute: 0 10*3/uL (ref 0.0–0.1)
Basophils Relative: 1 %
Eosinophils Absolute: 0.1 10*3/uL (ref 0.0–0.5)
Eosinophils Relative: 2 %
HCT: 39 % (ref 39.0–52.0)
Hemoglobin: 13.1 g/dL (ref 13.0–17.0)
Immature Granulocytes: 0 %
Lymphocytes Relative: 37 %
Lymphs Abs: 1.7 10*3/uL (ref 0.7–4.0)
MCH: 32.6 pg (ref 26.0–34.0)
MCHC: 33.6 g/dL (ref 30.0–36.0)
MCV: 97 fL (ref 80.0–100.0)
Monocytes Absolute: 0.5 10*3/uL (ref 0.1–1.0)
Monocytes Relative: 10 %
Neutro Abs: 2.3 10*3/uL (ref 1.7–7.7)
Neutrophils Relative %: 50 %
Platelets: 272 10*3/uL (ref 150–400)
RBC: 4.02 MIL/uL — ABNORMAL LOW (ref 4.22–5.81)
RDW: 15.1 % (ref 11.5–15.5)
WBC: 4.7 10*3/uL (ref 4.0–10.5)
nRBC: 0 % (ref 0.0–0.2)

## 2020-10-28 LAB — LIPID PANEL
Cholesterol: 306 mg/dL — ABNORMAL HIGH (ref 0–200)
HDL: 40 mg/dL — ABNORMAL LOW (ref >40)
LDL Cholesterol: 256 mg/dL — ABNORMAL HIGH (ref 0–99)
Total CHOL/HDL Ratio: 7.7 RATIO
Triglycerides: 51 mg/dL (ref <150)
VLDL: 10 mg/dL (ref 0–40)

## 2020-10-28 LAB — RESP PANEL BY RT-PCR (FLU A&B, COVID) ARPGX2
Influenza A by PCR: NEGATIVE
Influenza B by PCR: NEGATIVE
SARS Coronavirus 2 by RT PCR: NEGATIVE

## 2020-10-28 LAB — HEPARIN LEVEL (UNFRACTIONATED): Heparin Unfractionated: 0.4 IU/mL (ref 0.30–0.70)

## 2020-10-28 LAB — HIV ANTIBODY (ROUTINE TESTING W REFLEX): HIV Screen 4th Generation wRfx: NONREACTIVE

## 2020-10-28 LAB — HEMOGLOBIN A1C
Hgb A1c MFr Bld: 5.9 % — ABNORMAL HIGH (ref 4.8–5.6)
Mean Plasma Glucose: 122.63 mg/dL

## 2020-10-28 LAB — BRAIN NATRIURETIC PEPTIDE: B Natriuretic Peptide: 1827.5 pg/mL — ABNORMAL HIGH (ref 0.0–100.0)

## 2020-10-28 MED ORDER — HEPARIN (PORCINE) 25000 UT/250ML-% IV SOLN
1150.0000 [IU]/h | INTRAVENOUS | Status: DC
  Administered 2020-10-28: 900 [IU]/h via INTRAVENOUS
  Administered 2020-10-29: 1100 [IU]/h via INTRAVENOUS
  Administered 2020-10-30: 1400 [IU]/h via INTRAVENOUS
  Administered 2020-10-31: 1300 [IU]/h via INTRAVENOUS
  Administered 2020-11-01 – 2020-11-04 (×4): 1150 [IU]/h via INTRAVENOUS
  Filled 2020-10-28 (×8): qty 250

## 2020-10-28 MED ORDER — FUROSEMIDE 10 MG/ML IJ SOLN
40.0000 mg | Freq: Every day | INTRAMUSCULAR | Status: DC
  Administered 2020-10-29 – 2020-10-30 (×2): 40 mg via INTRAVENOUS
  Filled 2020-10-28 (×2): qty 4

## 2020-10-28 MED ORDER — CARVEDILOL 3.125 MG PO TABS
3.1250 mg | ORAL_TABLET | Freq: Two times a day (BID) | ORAL | Status: DC
  Administered 2020-10-29: 3.125 mg via ORAL
  Filled 2020-10-28 (×2): qty 1

## 2020-10-28 MED ORDER — HEPARIN BOLUS VIA INFUSION
4000.0000 [IU] | Freq: Once | INTRAVENOUS | Status: AC
  Administered 2020-10-28: 4000 [IU] via INTRAVENOUS
  Filled 2020-10-28: qty 4000

## 2020-10-28 MED ORDER — SODIUM CHLORIDE 0.9 % IV SOLN: 250.0000 mL | INTRAVENOUS | Status: DC | PRN

## 2020-10-28 MED ORDER — IPRATROPIUM-ALBUTEROL 0.5-2.5 (3) MG/3ML IN SOLN: 3.0000 mL | Freq: Four times a day (QID) | RESPIRATORY_TRACT | Status: DC | PRN

## 2020-10-28 MED ORDER — IPRATROPIUM-ALBUTEROL 0.5-2.5 (3) MG/3ML IN SOLN
3.0000 mL | Freq: Once | RESPIRATORY_TRACT | Status: AC
  Administered 2020-10-28: 3 mL via RESPIRATORY_TRACT
  Filled 2020-10-28: qty 3

## 2020-10-28 MED ORDER — ACETAMINOPHEN 325 MG PO TABS: 650.0000 mg | ORAL_TABLET | ORAL | Status: DC | PRN

## 2020-10-28 MED ORDER — ASPIRIN EC 81 MG PO TBEC
81.0000 mg | DELAYED_RELEASE_TABLET | Freq: Every day | ORAL | Status: DC
  Administered 2020-10-30 – 2020-11-04 (×6): 81 mg via ORAL
  Filled 2020-10-28 (×6): qty 1

## 2020-10-28 MED ORDER — FUROSEMIDE 10 MG/ML IJ SOLN
40.0000 mg | Freq: Once | INTRAMUSCULAR | Status: AC
  Administered 2020-10-28: 40 mg via INTRAVENOUS
  Filled 2020-10-28: qty 4

## 2020-10-28 MED ORDER — ONDANSETRON HCL 4 MG/2ML IJ SOLN
4.0000 mg | Freq: Four times a day (QID) | INTRAMUSCULAR | Status: DC | PRN
  Filled 2020-10-28: qty 2

## 2020-10-28 MED ORDER — SODIUM CHLORIDE 0.9% FLUSH: 3.0000 mL | INTRAVENOUS | Status: DC | PRN

## 2020-10-28 MED ORDER — SODIUM CHLORIDE 0.9% FLUSH
3.0000 mL | Freq: Two times a day (BID) | INTRAVENOUS | Status: DC
  Administered 2020-10-28 – 2020-11-04 (×3): 3 mL via INTRAVENOUS

## 2020-10-28 NOTE — H&P (Signed)
History and Physical    Socorro Ebron TFT:[id number] DOB: 09-06-1950 DOA: 10/28/2020  PCP: [name], MD (Confirm with patient/family/NH records and if not entered, this has to be entered at [facility] point of entry) Patient coming from: Home  I have personally briefly reviewed patient's old medical records in [facility]  Chief Complaint: SOB  HPI: Troy Johnson is a 70 y.o. male with medical history significant of HTN presented with new onset of shortness of breath.  Symptoms started last Friday, gradual getting worse.  Initially with exertional dyspnea, then developed orthopnea for last 2 nights.  Denies any cough, no wheezing, no fever chills.  Denies any chest pain.  Denies any leg swelling. ED Course: No hypoxia, troponin for set 656, potassium 5.9.  Tachycardia showed cardiomegaly and pulm vascular congestion.  EKG ST depression and T wave inversion V5 V6.  Cardiology consulted, patient received 1 mg IV Lasix and symptoms improved.  Heparin drip started in ED.  Review of Systems: As per HPI otherwise 14 point review of systems negative.    Past Medical History:  Diagnosis Date  . Anemia   . Asthma    childhood  . Blood in stool   . Childhood asthma   . GI bleed   . Kidney laceration 1999  . Seizures (HCC)     Past Surgical History:  Procedure Laterality Date  . KIDNEY SURGERY     for laceration     reports that he has quit smoking. He has never used smokeless tobacco. He reports current alcohol use. He reports that he does not use drugs.  Allergies  Allergen Reactions  . Penicillins Other (See Comments)    childhood    Family History  Problem Relation Age of Onset  . Cervical cancer Mother   . Hypertension Father   . Colon cancer Neg Hx   . Pancreatic cancer Neg Hx   . Esophageal cancer Neg Hx   . Stomach cancer Neg Hx      Prior to Admission medications   Medication Sig Start Date End Date Taking? Authorizing Provider  olmesartan (BENICAR)  20 MG tablet Take 20 mg by mouth daily. 09/23/20  Yes [provider]    Physical Exam: Vitals:   10/28/20 1423 10/28/20 1500 10/28/20 1530 10/28/20 1630  BP: (!) 127/99 (!) 125/91 116/80 (!) 134/104  Pulse: 87 68 93 (!) 101  Resp:  14 (!) 26 19  Temp: 97.7 F (36.5 C)     TempSrc: Oral     SpO2: 100% 100% 95% 100%    Constitutional: NAD, calm, comfortable Vitals:   10/28/20 1423 10/28/20 1500 10/28/20 1530 10/28/20 1630  BP: (!) 127/99 (!) 125/91 116/80 (!) 134/104  Pulse: 87 68 93 (!) 101  Resp:  14 (!) 26 19  Temp: 97.7 F (36.5 C)     TempSrc: Oral     SpO2: 100% 100% 95% 100%   Eyes: PERRL, lids and conjunctivae normal ENMT: Mucous membranes are moist. Posterior pharynx clear of any exudate or lesions.Normal dentition.  Neck: normal, supple, no masses, no thyromegaly Respiratory: clear to auscultation bilaterally, no wheezing, fine crackles on B/L bases.  Increasing respiratory effort. No accessory muscle use.  Cardiovascular: Regular rate and rhythm, no murmurs / rubs / gallops. No extremity edema. 2+ pedal pulses. No carotid bruits.  Abdomen: no tenderness, no masses palpated. No hepatosplenomegaly. Bowel sounds positive.  Musculoskeletal: no clubbing / cyanosis. No joint deformity upper and lower extremities. Good ROM, no  contractures. Normal muscle tone.  Skin: no rashes, lesions, ulcers. No induration Neurologic: CN 2-12 grossly intact. Sensation intact, DTR normal. Strength 5/5 in all 4.  Psychiatric: Normal judgment and insight. Alert and oriented x 3. Normal mood.     Labs on Admission: I have personally reviewed following labs and imaging studies  CBC: Recent Labs  Lab 10/28/20 1433  WBC 4.7  NEUTROABS 2.3  HGB 13.1  HCT 39.0  MCV 97.0  PLT 272   Basic Metabolic Panel: Recent Labs  Lab 10/28/20 1433  NA 136  K 5.9*  CL 106  CO2 21*  GLUCOSE 106*  BUN 20  CREATININE 1.23  CALCIUM 8.9   GFR: CrCl cannot be calculated (Unknown ideal  weight.). Liver Function Tests: No results for input(s): AST, ALT, ALKPHOS, BILITOT, PROT, ALBUMIN in the last 168 hours. No results for input(s): LIPASE, AMYLASE in the last 168 hours. No results for input(s): AMMONIA in the last 168 hours. Coagulation Profile: No results for input(s): INR, PROTIME in the last 168 hours. Cardiac Enzymes: No results for input(s): CKTOTAL, CKMB, CKMBINDEX, TROPONINI in the last 168 hours. BNP (last 3 results) No results for input(s): PROBNP in the last 8760 hours. HbA1C: No results for input(s): HGBA1C in the last 72 hours. CBG: No results for input(s): GLUCAP in the last 168 hours. Lipid Profile: No results for input(s): CHOL, HDL, LDLCALC, TRIG, CHOLHDL, LDLDIRECT in the last 72 hours. Thyroid Function Tests: No results for input(s): TSH, T4TOTAL, FREET4, T3FREE, THYROIDAB in the last 72 hours. Anemia Panel: No results for input(s): VITAMINB12, FOLATE, FERRITIN, TIBC, IRON, RETICCTPCT in the last 72 hours. Urine analysis: No results found for: COLORURINE, APPEARANCEUR, LABSPEC, PHURINE, GLUCOSEU, HGBUR, BILIRUBINUR, KETONESUR, PROTEINUR, UROBILINOGEN, NITRITE, LEUKOCYTESUR  Radiological Exams on Admission: DG Chest Port 1 View  Result Date: 10/28/2020 CLINICAL DATA:  Dyspnea EXAM: PORTABLE CHEST 1 VIEW COMPARISON:  None. FINDINGS: Mild enlargement of the cardiopericardial silhouette. Atherosclerotic aortic arch. Otherwise normal mediastinal contour. No pneumothorax. Trace right pleural effusion. No left pleural effusion. Borderline mild pulmonary edema. No acute consolidative airspace disease. IMPRESSION: Borderline mild congestive heart failure. Trace right pleural effusion. Electronically Signed   By: Delbert Phenix M.D.   On: 10/28/2020 14:50    EKG: Independently reviewed. ST depression on V5 V6  Assessment/Plan Active Problems:   Acute CHF (congestive heart failure) (HCC)   CHF (congestive heart failure) (HCC)  (please populate well all  problems here in Problem List. (For example, if patient is on BP meds at home and you resume or decide to hold them, it is a problem that needs to be her. Same for CAD, COPD, HLD and so on)  Acute decompensated CHF, likely systolic -Continue IV Lasix -Echo -Cardio to follow.  NSTEMI vs demanding ischemia -No chest pain, and Trop elevation pattern is somewhat flat 656>746, implying trop elevation to CHF decompensation. -Continue ASA, start Coreg -No ACEI or ARB, for hyperkalemia -Check A1C and lipid panel, and UDS  Hyperkalemia -Related to ARB use. -Stop ARB, received Lasix. Recheck K level in AM.  HTN -Start Coreg  DVT prophylaxis: Heparin drip Code Status: Full Code Family Communication: None at bedside Disposition Plan: Expect more than 2 midnight hospital stay Consults called: Cardiology Admission status: PCU   Emeline General MD [facility] Pager [phone]  10/28/2020, 5:37 PM

## 2020-10-28 NOTE — ED Notes (Signed)
Attempted to give report, phone number left with Diplomatic Services operational officer

## 2020-10-28 NOTE — Progress Notes (Addendum)
ANTICOAGULATION CONSULT NOTE - Follow Up Consult  Pharmacy Consult for heparin Indication: r/o ACS  Labs: Recent Labs    10/28/20 1433 10/28/20 1619 10/28/20 2006  HGB 13.1  --   --   HCT 39.0  --   --   PLT 272  --   --   HEPARINUNFRC  --   --  0.40  CREATININE 1.23 1.21  --   TROPONINIHS 656* 746*  --     Assessment/Plan:  70yo male therapeutic on heparin with initial dosing for possible ACS. Will continue gtt at current rate of 900 units/hr and confirm stable with am labs.   Vernard Gambles, PharmD, BCPS  10/28/2020,11:01 PM   Addendum: Heparin level now low at 0.24.  Will increase heparin gtt by 2-3 units/kg/hr to 1100 units/hr and check level in 6 hours.  VB 10/29/2020 3:59 AM

## 2020-10-28 NOTE — ED Provider Notes (Signed)
  Physical Exam  BP (!) 134/104 (BP Location: Right Arm)   Pulse (!) 101   Temp 97.7 F (36.5 C) (Oral)   Resp 19   SpO2 100%   Physical Exam  ED Course/Procedures     Procedures  MDM  Patient care assumed at 3 pm. Patient here with SOB. Has abnormal EKG. Code STEMI was canceled by Dr. Rosemary Holms. Sign out pending Trop, BNP   5:11 PM  K 5.9, supplemented. Trop 656. BNP 1800.  Patient likely has new onset heart failure with demand ischemia.  Has no chest pain currently.  Patient already received aspirin prior to arrival.  Will start on heparin drip.  I talked to Dr. Rosemary Holms again.  He will see patient as consult and recommend echo.  Recommend hospitalist admission  CRITICAL CARE Performed by: Richardean Canal   Total critical care time: 30 minutes  Critical care time was exclusive of separately billable procedures and treating other patients.  Critical care was necessary to treat or prevent imminent or life-threatening deterioration.  Critical care was time spent personally by me on the following activities: development of treatment plan with patient and/or surrogate as well as nursing, discussions with consultants, evaluation of patient's response to treatment, examination of patient, obtaining history from patient or surrogate, ordering and performing treatments and interventions, ordering and review of laboratory studies, ordering and review of radiographic studies, pulse oximetry and re-evaluation of patient's condition.      Charlynne Pander, MD 10/28/20 [phone]

## 2020-10-28 NOTE — Progress Notes (Signed)
ANTICOAGULATION CONSULT NOTE - Initial Consult  Pharmacy Consult for heparin Indication: chest pain/ACS  Allergies  Allergen Reactions  . Penicillins Other (See Comments)    childhood    Patient Measurements:   Heparin Dosing Weight: 77kg  Vital Signs: Temp: 97.7 F (36.5 C) (05/02 1423) Temp Source: Oral (05/02 1423) BP: 116/80 (05/02 1530) Pulse Rate: 93 (05/02 1530)  Labs: Recent Labs    10/28/20 1433  HGB 13.1  HCT 39.0  PLT 272  CREATININE 1.23  TROPONINIHS 656*    CrCl cannot be calculated (Unknown ideal weight.).   Medical History: Past Medical History:  Diagnosis Date  . Anemia   . Asthma    childhood  . Blood in stool   . Childhood asthma   . GI bleed   . Kidney laceration 1999  . Seizures (HCC)    Assessment: 20 YOM presenting with SOB, initial code STEMI cancelled, he is not on anticoagulation.  CBC wnl  Goal of Therapy:  Heparin level 0.3-0.7 units/ml Monitor platelets by anticoagulation protocol: Yes   Plan:  Heparin 4000 units IV x 1, and gtt at 900 units/hr F/u 6 hour heparin level F/u cards eval  Daylene Posey, PharmD Clinical Pharmacist ED Pharmacist Phone # [phone] 10/28/2020 4:46 PM

## 2020-10-28 NOTE — ED Triage Notes (Signed)
Sent from PCP R/O stemi. Patient reports SOB on exertion and supine since Saturday.

## 2020-10-28 NOTE — H&P (View-Only) (Signed)
CARDIOLOGY CONSULT NOTE  Patient ID: Troy Johnson MRN: [id number] DOB/AGE: 10/25/1950 70 y.o.  Admit date: 10/28/2020 Referring Physician: David Yao, MD Reason for Consultation:  Shortness of breath, troponin/BNP elevation  HPI:   70 y.o. African Aemrican male  with h/o anemia 2018, possible seizure 11/2019, now admitted with shortness of breath.  Patient delivers doors and windows for living, which includes physical activity.  For last few days, he has noticed worsening exertional dyspnea as well as orthopnea.  Patient was being seen by his PCP today, who recommended ER evaluation for abnormal EKG.  Initially, code STEMI was called by EMS.  I personally reviewed the EKG which did not reveal STEMI, therefore code STEMI was canceled.  Work-up in the ED showed vascular congestion and heart failure changes on chest x-ray, elevated BNP at 1800, elevated high-sensitivity troponin at 600-700.  Patient denies any chest pain.  He has been diuresing profusely with IV Lasix given in the ER.  Urine output is not documented.  Patient's heart rate is noted to be variable between 70-120 bpm with minimal activity.  Past Medical History:  Diagnosis Date  . Anemia   . Asthma    childhood  . Blood in stool   . Childhood asthma   . GI bleed   . Kidney laceration 1999  . Seizures (HCC)      Past Surgical History:  Procedure Laterality Date  . KIDNEY SURGERY     for laceration      Family History  Problem Relation Age of Onset  . Cervical cancer Mother   . Hypertension Father   . Colon cancer Neg Hx   . Pancreatic cancer Neg Hx   . Esophageal cancer Neg Hx   . Stomach cancer Neg Hx      Social History: Social History   Socioeconomic History  . Marital status: Widowed    Spouse name: Not on file  . Number of children: Not on file  . Years of education: Not on file  . Highest education level: Not on file  Occupational History  . Occupation: truck driver  Tobacco Use  . Smoking  status: Former Smoker  . Smokeless tobacco: Never Used  . Tobacco comment: quit in 90's  Substance and Sexual Activity  . Alcohol use: Yes    Comment: 1 beer/month  . Drug use: No  . Sexual activity: Not on file  Other Topics Concern  . Not on file  Social History Narrative   Driver for [employer].     Social Determinants of Health   Financial Resource Strain: Not on file  Food Insecurity: Not on file  Transportation Needs: Not on file  Physical Activity: Not on file  Stress: Not on file  Social Connections: Not on file  Intimate Partner Violence: Not on file     (Not in a hospital admission)   Review of Systems  Constitutional: Negative for decreased appetite, malaise/fatigue, weight gain and weight loss.  HENT: Negative for congestion.   Eyes: Negative for visual disturbance.  Cardiovascular: Positive for dyspnea on exertion and orthopnea. Negative for chest pain, leg swelling, palpitations and syncope.  Respiratory: Negative for cough.   Endocrine: Negative for cold intolerance.  Hematologic/Lymphatic: Does not bruise/bleed easily.  Skin: Negative for itching and rash.  Musculoskeletal: Negative for myalgias.  Gastrointestinal: Negative for abdominal pain, nausea and vomiting.  Genitourinary: Negative for dysuria.  Neurological: Negative for dizziness and weakness.  Psychiatric/Behavioral: The patient is not nervous/anxious.     All other systems reviewed and are negative.     Physical Exam: Physical Exam Vitals and nursing note reviewed.  Constitutional:      General: He is not in acute distress.    Appearance: He is well-developed.  HENT:     Head: Normocephalic and atraumatic.  Eyes:     Conjunctiva/sclera: Conjunctivae normal.     Pupils: Pupils are equal, round, and reactive to light.  Neck:     Vascular: JVD present.  Cardiovascular:     Rate and Rhythm: Normal rate and regular rhythm.     Pulses: Normal pulses and intact distal pulses.      Heart sounds: No murmur heard.   Pulmonary:     Effort: Pulmonary effort is normal.     Breath sounds: Examination of the right-lower field reveals rales. Examination of the left-lower field reveals rales. Rales present. No wheezing.  Abdominal:     General: Bowel sounds are normal.     Palpations: Abdomen is soft.     Tenderness: There is no rebound.  Musculoskeletal:        General: No tenderness. Normal range of motion.     Right lower leg: No edema.     Left lower leg: No edema.  Lymphadenopathy:     Cervical: No cervical adenopathy.  Skin:    General: Skin is warm and dry.  Neurological:     Mental Status: He is alert and oriented to person, place, and time.     Cranial Nerves: No cranial nerve deficit.      Labs:   Lab Results  Component Value Date   WBC 4.7 10/28/2020   HGB 13.1 10/28/2020   HCT 39.0 10/28/2020   MCV 97.0 10/28/2020   PLT 272 10/28/2020    Recent Labs  Lab 10/28/20 1433  NA 136  K 5.9*  CL 106  CO2 21*  BUN 20  CREATININE 1.23  CALCIUM 8.9  GLUCOSE 106*    Lipid Panel     Component Value Date/Time   CHOL 319 (H) 12/13/2019 1028   TRIG 130.0 12/13/2019 1028   HDL 52.70 12/13/2019 1028   CHOLHDL 6 12/13/2019 1028   VLDL 26.0 12/13/2019 1028   LDLCALC 240 (H) 12/13/2019 1028    BNP (last 3 results) Recent Labs    10/28/20 1433  BNP 1,827.5*    HEMOGLOBIN A1C Lab Results  Component Value Date   HGBA1C 6.0 (H) 11/24/2019    Cardiac Panel (last 3 results) Results for VERLON, PISCHKE (MRN [id number]) as of 10/28/2020 19:04  Ref. Range 10/28/2020 14:33 10/28/2020 14:50 10/28/2020 16:19  B Natriuretic Peptide Latest Ref Range: 0.0 - 100.0 pg/mL 1,827.5 (H)    Troponin I (High Sensitivity) Latest Ref Range: <18 ng/L 656 (HH)  746 (HH)   TSH Not checked   Radiology: [facility] Chest Port 1 View  Result Date: 10/28/2020 CLINICAL DATA:  Dyspnea EXAM: PORTABLE CHEST 1 VIEW COMPARISON:  None. FINDINGS: Mild enlargement of the  cardiopericardial silhouette. Atherosclerotic aortic arch. Otherwise normal mediastinal contour. No pneumothorax. Trace right pleural effusion. No left pleural effusion. Borderline mild pulmonary edema. No acute consolidative airspace disease. IMPRESSION: Borderline mild congestive heart failure. Trace right pleural effusion. Electronically Signed   By: Delbert Phenix M.D.   On: 10/28/2020 14:50    Scheduled Meds: . carvedilol  3.125 mg Oral BID WC  . [START ON 10/29/2020] furosemide  40 mg Intravenous Daily  . heparin  4,000 Units Intravenous Once  . sodium chloride  flush  3 mL Intravenous Q12H   Continuous Infusions: . sodium chloride    . heparin     PRN Meds:.sodium chloride, acetaminophen, ipratropium-albuterol, ondansetron (ZOFRAN) IV, sodium chloride flush  CARDIAC STUDIES:  EKG 10/28/2020: Sinus rhythm, LVH Inferolateral T wave inversion, consider ischemia  Echocardiogram pending:    Assessment & Recommendations:  70 y.o. African Aemrican male  with h/o anemia 2018, possible seizure 11/2019, now with suspected new onset heart failure  Heart failure: Mildly volume overloaded on physical exam.  EF not known.  Echocardiogram pending. Continue IV diuresis with Lasix 40 mg daily, diuresing well. Currently on carvedilol 3.125 mg twice daily, okay to continue. Pending echocardiogram, he will likely need right and left heart catheterization for further evaluation.  Elevated troponin: Likely due to acute coronary syndrome.  Suspect secondary to heart failure. Okay to continue heparin for now, pending ischemic evaluation.     Renaldo Fiddler, MD Pager: [phone] Office: [phone]

## 2020-10-28 NOTE — ED Provider Notes (Signed)
[facility] EMERGENCY DEPARTMENT Provider Note   CSN: [id number] Arrival date & time: 10/28/20  1403     History Chief Complaint  Patient presents with  . Shortness of Breath    Cordel Drewes is a 70 y.o. male.  Patient presents with shortness of breath intermittent ongoing for 3 days.  He saw his primary care doctor today who did an EKG which was concerning and sent him to the ER.  Patient otherwise denies any chest pain or chest pressure or chest discomfort.  Denies any fever cough or vomiting or diarrhea.  He states has been having intermittent shortness of breath with exertion and when laying flat.  Currently he denies any symptoms states he feels much better at this time.         Past Medical History:  Diagnosis Date  . Anemia   . Asthma    childhood  . Blood in stool   . Childhood asthma   . GI bleed   . Kidney laceration 1999  . Seizures [facility])     Patient Active Problem List   Diagnosis Date Noted  . Prediabetes 12/10/2019  . Anemia 12/16/2016  . Syncope 12/16/2016  . H/O: GI bleed 12/09/2016    Past Surgical History:  Procedure Laterality Date  . KIDNEY SURGERY     for laceration       Family History  Problem Relation Age of Onset  . Cervical cancer Mother   . Hypertension Father   . Colon cancer Neg Hx   . Pancreatic cancer Neg Hx   . Esophageal cancer Neg Hx   . Stomach cancer Neg Hx     Social History   Tobacco Use  . Smoking status: Former Games developer  . Smokeless tobacco: Never Used  . Tobacco comment: quit in 90's  Substance Use Topics  . Alcohol use: Yes    Comment: 1 beer/month  . Drug use: No    Home Medications Prior to Admission medications   Medication Sig Start Date End Date Taking? Authorizing Provider  olmesartan (BENICAR) 20 MG tablet Take 20 mg by mouth daily. 09/23/20   [provider]    Allergies    Penicillins  Review of Systems   Review of Systems  Constitutional: Negative for fever.   HENT: Negative for ear pain and sore throat.   Eyes: Negative for pain.  Respiratory: Positive for shortness of breath. Negative for cough.   Cardiovascular: Negative for chest pain.  Gastrointestinal: Negative for abdominal pain.  Genitourinary: Negative for flank pain.  Musculoskeletal: Negative for back pain.  Skin: Negative for color change and rash.  Neurological: Negative for syncope.  All other systems reviewed and are negative.   Physical Exam Updated Vital Signs BP (!) 127/99 (BP Location: Right Arm)   Pulse 87   Temp 97.7 F (36.5 C) (Oral)   SpO2 100%   Physical Exam Constitutional:      General: He is not in acute distress.    Appearance: He is well-developed.  HENT:     Head: Normocephalic.     Nose: Nose normal.  Eyes:     Extraocular Movements: Extraocular movements intact.  Cardiovascular:     Rate and Rhythm: Normal rate.  Pulmonary:     Effort: Pulmonary effort is normal.  Skin:    Coloration: Skin is not jaundiced.  Neurological:     Mental Status: He is alert. Mental status is at baseline.     ED Results / Procedures /  Treatments   Labs (all labs ordered are listed, but only abnormal results are displayed) Labs Reviewed  CBC WITH DIFFERENTIAL/PLATELET  BASIC METABOLIC PANEL  BRAIN NATRIURETIC PEPTIDE  TROPONIN I (HIGH SENSITIVITY)    EKG None  Radiology DG Chest Port 1 View  Result Date: 10/28/2020 CLINICAL DATA:  Dyspnea EXAM: PORTABLE CHEST 1 VIEW COMPARISON:  None. FINDINGS: Mild enlargement of the cardiopericardial silhouette. Atherosclerotic aortic arch. Otherwise normal mediastinal contour. No pneumothorax. Trace right pleural effusion. No left pleural effusion. Borderline mild pulmonary edema. No acute consolidative airspace disease. IMPRESSION: Borderline mild congestive heart failure. Trace right pleural effusion. Electronically Signed   By: Delbert Phenix M.D.   On: 10/28/2020 14:50    Procedures Procedures   Medications  Ordered in ED Medications - No data to display  ED Course  I have reviewed the triage vital signs and the nursing notes.  Pertinent labs & imaging results that were available during my care of the patient were reviewed by me and considered in my medical decision making (see chart for details).    MDM Rules/Calculators/A&P                          EKG was concerning for ST elevations in V 3-4 with diffuse ST depressions.  No prior EKG available for comparison at the time and a STEMI was activated.  Patient seen by cardiology and a STEMI code was canceled and medical management was advised.  Patient continues to have no complaints of any chest pain at this time.  Labs and studies have been ordered and pending.  Will be signed out to oncoming provider. Final Clinical Impression(s) / ED Diagnoses Final diagnoses:  SOB (shortness of breath)    Rx / DC Orders ED Discharge Orders    None       Cheryll Cockayne, MD 10/28/20 1510

## 2020-10-28 NOTE — Consult Note (Signed)
CARDIOLOGY CONSULT NOTE  Patient ID: Troy Johnson MRN: [id number] DOB/AGE: [birth year] 70 y.o.  Admit date: 10/28/2020 Referring Physician: Chaney Malling, MD Reason for Consultation:  Shortness of breath, troponin/BNP elevation  HPI:   70 y.o. African Aemrican male  with h/o anemia 2018, possible seizure 11/2019, now admitted with shortness of breath.  Patient delivers doors and windows for living, which includes physical activity.  For last few days, he has noticed worsening exertional dyspnea as well as orthopnea.  Patient was being seen by his PCP today, who recommended ER evaluation for abnormal EKG.  Initially, code STEMI was called by EMS.  I personally reviewed the EKG which did not reveal STEMI, therefore code STEMI was canceled.  Work-up in the ED showed vascular congestion and heart failure changes on chest x-ray, elevated BNP at 1800, elevated high-sensitivity troponin at 600-700.  Patient denies any chest pain.  He has been diuresing profusely with IV Lasix given in the ER.  Urine output is not documented.  Patient's heart rate is noted to be variable between 70-120 bpm with minimal activity.  Past Medical History:  Diagnosis Date  . Anemia   . Asthma    childhood  . Blood in stool   . Childhood asthma   . GI bleed   . Kidney laceration 1999  . Seizures (HCC)      Past Surgical History:  Procedure Laterality Date  . KIDNEY SURGERY     for laceration      Family History  Problem Relation Age of Onset  . Cervical cancer Mother   . Hypertension Father   . Colon cancer Neg Hx   . Pancreatic cancer Neg Hx   . Esophageal cancer Neg Hx   . Stomach cancer Neg Hx      Social History: Social History   Socioeconomic History  . Marital status: Widowed    Spouse name: Not on file  . Number of children: Not on file  . Years of education: Not on file  . Highest education level: Not on file  Occupational History  . Occupation: truck Hospital doctor  Tobacco Use  . Smoking  status: Former Games developer  . Smokeless tobacco: Never Used  . Tobacco comment: quit in 90's  Substance and Sexual Activity  . Alcohol use: Yes    Comment: 1 beer/month  . Drug use: No  . Sexual activity: Not on file  Other Topics Concern  . Not on file  Social History Narrative   Hospital doctor for [employer].     Social Determinants of Health   Financial Resource Strain: Not on file  Food Insecurity: Not on file  Transportation Needs: Not on file  Physical Activity: Not on file  Stress: Not on file  Social Connections: Not on file  Intimate Partner Violence: Not on file     (Not in a hospital admission)   Review of Systems  Constitutional: Negative for decreased appetite, malaise/fatigue, weight gain and weight loss.  HENT: Negative for congestion.   Eyes: Negative for visual disturbance.  Cardiovascular: Positive for dyspnea on exertion and orthopnea. Negative for chest pain, leg swelling, palpitations and syncope.  Respiratory: Negative for cough.   Endocrine: Negative for cold intolerance.  Hematologic/Lymphatic: Does not bruise/bleed easily.  Skin: Negative for itching and rash.  Musculoskeletal: Negative for myalgias.  Gastrointestinal: Negative for abdominal pain, nausea and vomiting.  Genitourinary: Negative for dysuria.  Neurological: Negative for dizziness and weakness.  Psychiatric/Behavioral: The patient is not nervous/anxious.  All other systems reviewed and are negative.     Physical Exam: Physical Exam Vitals and nursing note reviewed.  Constitutional:      General: He is not in acute distress.    Appearance: He is well-developed.  HENT:     Head: Normocephalic and atraumatic.  Eyes:     Conjunctiva/sclera: Conjunctivae normal.     Pupils: Pupils are equal, round, and reactive to light.  Neck:     Vascular: JVD present.  Cardiovascular:     Rate and Rhythm: Normal rate and regular rhythm.     Pulses: Normal pulses and intact distal pulses.      Heart sounds: No murmur heard.   Pulmonary:     Effort: Pulmonary effort is normal.     Breath sounds: Examination of the right-lower field reveals rales. Examination of the left-lower field reveals rales. Rales present. No wheezing.  Abdominal:     General: Bowel sounds are normal.     Palpations: Abdomen is soft.     Tenderness: There is no rebound.  Musculoskeletal:        General: No tenderness. Normal range of motion.     Right lower leg: No edema.     Left lower leg: No edema.  Lymphadenopathy:     Cervical: No cervical adenopathy.  Skin:    General: Skin is warm and dry.  Neurological:     Mental Status: He is alert and oriented to person, place, and time.     Cranial Nerves: No cranial nerve deficit.      Labs:   Lab Results  Component Value Date   WBC 4.7 10/28/2020   HGB 13.1 10/28/2020   HCT 39.0 10/28/2020   MCV 97.0 10/28/2020   PLT 272 10/28/2020    Recent Labs  Lab 10/28/20 1433  NA 136  K 5.9*  CL 106  CO2 21*  BUN 20  CREATININE 1.23  CALCIUM 8.9  GLUCOSE 106*    Lipid Panel     Component Value Date/Time   CHOL 319 (H) 12/13/2019 1028   TRIG 130.0 12/13/2019 1028   HDL 52.70 12/13/2019 1028   CHOLHDL 6 12/13/2019 1028   VLDL 26.0 12/13/2019 1028   LDLCALC 240 (H) 12/13/2019 1028    BNP (last 3 results) Recent Labs    10/28/20 1433  BNP 1,827.5*    HEMOGLOBIN A1C Lab Results  Component Value Date   HGBA1C 6.0 (H) 11/24/2019    Cardiac Panel (last 3 results) Results for VERLON, PISCHKE (MRN [id number]) as of 10/28/2020 19:04  Ref. Range 10/28/2020 14:33 10/28/2020 14:50 10/28/2020 16:19  B Natriuretic Peptide Latest Ref Range: 0.0 - 100.0 pg/mL 1,827.5 (H)    Troponin I (High Sensitivity) Latest Ref Range: <18 ng/L 656 (HH)  746 (HH)   TSH Not checked   Radiology: [facility] Chest Port 1 View  Result Date: 10/28/2020 CLINICAL DATA:  Dyspnea EXAM: PORTABLE CHEST 1 VIEW COMPARISON:  None. FINDINGS: Mild enlargement of the  cardiopericardial silhouette. Atherosclerotic aortic arch. Otherwise normal mediastinal contour. No pneumothorax. Trace right pleural effusion. No left pleural effusion. Borderline mild pulmonary edema. No acute consolidative airspace disease. IMPRESSION: Borderline mild congestive heart failure. Trace right pleural effusion. Electronically Signed   By: Delbert Phenix M.D.   On: 10/28/2020 14:50    Scheduled Meds: . carvedilol  3.125 mg Oral BID WC  . [START ON 10/29/2020] furosemide  40 mg Intravenous Daily  . heparin  4,000 Units Intravenous Once  . sodium chloride  flush  3 mL Intravenous Q12H   Continuous Infusions: . sodium chloride    . heparin     PRN Meds:.sodium chloride, acetaminophen, ipratropium-albuterol, ondansetron (ZOFRAN) IV, sodium chloride flush  CARDIAC STUDIES:  EKG 10/28/2020: Sinus rhythm, LVH Inferolateral T wave inversion, consider ischemia  Echocardiogram pending:    Assessment & Recommendations:  70 y.o. African Aemrican male  with h/o anemia 2018, possible seizure 11/2019, now with suspected new onset heart failure  Heart failure: Mildly volume overloaded on physical exam.  EF not known.  Echocardiogram pending. Continue IV diuresis with Lasix 40 mg daily, diuresing well. Currently on carvedilol 3.125 mg twice daily, okay to continue. Pending echocardiogram, he will likely need right and left heart catheterization for further evaluation.  Elevated troponin: Likely due to acute coronary syndrome.  Suspect secondary to heart failure. Okay to continue heparin for now, pending ischemic evaluation.     Renaldo Fiddler, MD Pager: [phone] Office: [phone]

## 2020-10-28 NOTE — ED Notes (Signed)
ED Provider at bedside. 

## 2020-10-29 ENCOUNTER — Inpatient Hospital Stay (HOSPITAL_COMMUNITY): Payer: Medicare Other

## 2020-10-29 ENCOUNTER — Encounter (HOSPITAL_COMMUNITY): Payer: Self-pay | Admitting: Cardiology

## 2020-10-29 ENCOUNTER — Encounter (HOSPITAL_COMMUNITY)
Admission: EM | Disposition: A | Discharge status: Home / Self Care | Payer: Self-pay | Source: Ambulatory Visit | Attending: Thoracic Surgery (Cardiothoracic Vascular Surgery)

## 2020-10-29 DIAGNOSIS — I25110 Atherosclerotic heart disease of native coronary artery with unstable angina pectoris: Secondary | ICD-10-CM

## 2020-10-29 DIAGNOSIS — I214 Non-ST elevation (NSTEMI) myocardial infarction: Secondary | ICD-10-CM | POA: Diagnosis not present

## 2020-10-29 DIAGNOSIS — I509 Heart failure, unspecified: Secondary | ICD-10-CM

## 2020-10-29 HISTORY — PX: RIGHT/LEFT HEART CATH AND CORONARY ANGIOGRAPHY: CATH118266

## 2020-10-29 LAB — BASIC METABOLIC PANEL
Anion gap: 10 (ref 5–15)
BUN: 18 mg/dL (ref 8–23)
CO2: 23 mmol/L (ref 22–32)
Calcium: 9.1 mg/dL (ref 8.9–10.3)
Chloride: 104 mmol/L (ref 98–111)
Creatinine, Ser: 1.31 mg/dL — ABNORMAL HIGH (ref 0.61–1.24)
GFR, Estimated: 59 mL/min — ABNORMAL LOW (ref >60)
Glucose, Bld: 98 mg/dL (ref 70–99)
Potassium: 3.7 mmol/L (ref 3.5–5.1)
Sodium: 137 mmol/L (ref 135–145)

## 2020-10-29 LAB — CBC
HCT: 37.2 % — ABNORMAL LOW (ref 39.0–52.0)
Hemoglobin: 12.8 g/dL — ABNORMAL LOW (ref 13.0–17.0)
MCH: 32.6 pg (ref 26.0–34.0)
MCHC: 34.4 g/dL (ref 30.0–36.0)
MCV: 94.7 fL (ref 80.0–100.0)
Platelets: 289 10*3/uL (ref 150–400)
RBC: 3.93 MIL/uL — ABNORMAL LOW (ref 4.22–5.81)
RDW: 14.8 % (ref 11.5–15.5)
WBC: 5.1 10*3/uL (ref 4.0–10.5)
nRBC: 0 % (ref 0.0–0.2)

## 2020-10-29 LAB — POCT I-STAT EG7
Acid-base deficit: 1 mmol/L (ref 0.0–2.0)
Bicarbonate: 23.9 mmol/L (ref 20.0–28.0)
Calcium, Ion: 1.13 mmol/L — ABNORMAL LOW (ref 1.15–1.40)
HCT: 37 % — ABNORMAL LOW (ref 39.0–52.0)
Hemoglobin: 12.6 g/dL — ABNORMAL LOW (ref 13.0–17.0)
O2 Saturation: 67 %
Potassium: 3.5 mmol/L (ref 3.5–5.1)
Sodium: 139 mmol/L (ref 135–145)
TCO2: 25 mmol/L (ref 22–32)
pCO2, Ven: 41.4 mmHg — ABNORMAL LOW (ref 44.0–60.0)
pH, Ven: 7.369 (ref 7.250–7.430)
pO2, Ven: 36 mmHg (ref 32.0–45.0)

## 2020-10-29 LAB — POCT I-STAT 7, (LYTES, BLD GAS, ICA,H+H)
Acid-base deficit: 1 mmol/L (ref 0.0–2.0)
Bicarbonate: 23.4 mmol/L (ref 20.0–28.0)
Calcium, Ion: 1.18 mmol/L (ref 1.15–1.40)
HCT: 38 % — ABNORMAL LOW (ref 39.0–52.0)
Hemoglobin: 12.9 g/dL — ABNORMAL LOW (ref 13.0–17.0)
O2 Saturation: 96 %
Potassium: 3.7 mmol/L (ref 3.5–5.1)
Sodium: 140 mmol/L (ref 135–145)
TCO2: 25 mmol/L (ref 22–32)
pCO2 arterial: 37.7 mmHg (ref 32.0–48.0)
pH, Arterial: 7.402 (ref 7.350–7.450)
pO2, Arterial: 84 mmHg (ref 83.0–108.0)

## 2020-10-29 LAB — ECHOCARDIOGRAM COMPLETE
AR max vel: 2.57 cm2
AV Area VTI: 2.16 cm2
AV Area mean vel: 2.54 cm2
AV Mean grad: 2 mmHg
AV Peak grad: 2.8 mmHg
Ao pk vel: 0.84 m/s
Area-P 1/2: 4.15 cm2
Height: 73 in
MV M vel: 4.29 m/s
MV Peak grad: 73.6 mmHg
Radius: 0.3 cm
S' Lateral: 6 cm
Weight: 2483.2 oz

## 2020-10-29 LAB — SURGICAL PCR SCREEN
MRSA, PCR: NEGATIVE
Staphylococcus aureus: NEGATIVE

## 2020-10-29 LAB — HEPARIN LEVEL (UNFRACTIONATED)
Heparin Unfractionated: 0.24 IU/mL — ABNORMAL LOW (ref 0.30–0.70)
Heparin Unfractionated: 0.39 IU/mL (ref 0.30–0.70)

## 2020-10-29 SURGERY — RIGHT/LEFT HEART CATH AND CORONARY ANGIOGRAPHY
Anesthesia: LOCAL

## 2020-10-29 MED ORDER — SODIUM CHLORIDE 0.9 % IV SOLN: 250.0000 mL | INTRAVENOUS | Status: DC | PRN

## 2020-10-29 MED ORDER — SODIUM CHLORIDE 0.9% FLUSH: 3.0000 mL | INTRAVENOUS | Status: DC | PRN

## 2020-10-29 MED ORDER — ATORVASTATIN CALCIUM 40 MG PO TABS
40.0000 mg | ORAL_TABLET | Freq: Every day | ORAL | Status: DC
  Administered 2020-10-29 – 2020-10-30 (×2): 40 mg via ORAL
  Filled 2020-10-29 (×2): qty 1

## 2020-10-29 MED ORDER — SODIUM CHLORIDE 0.9 % IV SOLN: INTRAVENOUS | Status: DC

## 2020-10-29 MED ORDER — LIDOCAINE HCL (PF) 1 % IJ SOLN
INTRAMUSCULAR | Status: DC | PRN
  Administered 2020-10-29: 3 mL

## 2020-10-29 MED ORDER — HYDRALAZINE HCL 20 MG/ML IJ SOLN: 10.0000 mg | INTRAMUSCULAR | Status: AC | PRN

## 2020-10-29 MED ORDER — ASPIRIN 81 MG PO CHEW: 81.0000 mg | CHEWABLE_TABLET | ORAL | Status: DC

## 2020-10-29 MED ORDER — LIDOCAINE HCL (PF) 1 % IJ SOLN
INTRAMUSCULAR | Status: AC
  Filled 2020-10-29: qty 30

## 2020-10-29 MED ORDER — MIDAZOLAM HCL 2 MG/2ML IJ SOLN
INTRAMUSCULAR | Status: DC | PRN
  Administered 2020-10-29: 1 mg via INTRAVENOUS

## 2020-10-29 MED ORDER — FENTANYL CITRATE (PF) 100 MCG/2ML IJ SOLN
INTRAMUSCULAR | Status: AC
  Filled 2020-10-29: qty 2

## 2020-10-29 MED ORDER — PERFLUTREN LIPID MICROSPHERE
1.0000 mL | INTRAVENOUS | Status: AC | PRN
  Administered 2020-10-29: 2 mL via INTRAVENOUS
  Filled 2020-10-29: qty 10

## 2020-10-29 MED ORDER — LABETALOL HCL 5 MG/ML IV SOLN: 10.0000 mg | INTRAVENOUS | Status: AC | PRN

## 2020-10-29 MED ORDER — HEPARIN (PORCINE) IN NACL 1000-0.9 UT/500ML-% IV SOLN
INTRAVENOUS | Status: AC
  Filled 2020-10-29: qty 500

## 2020-10-29 MED ORDER — ASPIRIN 81 MG PO CHEW
81.0000 mg | CHEWABLE_TABLET | ORAL | Status: AC
  Administered 2020-10-29: 81 mg via ORAL
  Filled 2020-10-29: qty 1

## 2020-10-29 MED ORDER — SODIUM CHLORIDE 0.9% FLUSH
3.0000 mL | Freq: Two times a day (BID) | INTRAVENOUS | Status: DC
  Administered 2020-10-29 – 2020-11-04 (×4): 3 mL via INTRAVENOUS

## 2020-10-29 MED ORDER — HEPARIN SODIUM (PORCINE) 1000 UNIT/ML IJ SOLN
INTRAMUSCULAR | Status: AC
  Filled 2020-10-29: qty 1

## 2020-10-29 MED ORDER — HEPARIN SODIUM (PORCINE) 1000 UNIT/ML IJ SOLN
INTRAMUSCULAR | Status: DC | PRN
  Administered 2020-10-29: 3500 [IU] via INTRAVENOUS

## 2020-10-29 MED ORDER — IVABRADINE HCL 5 MG PO TABS
5.0000 mg | ORAL_TABLET | Freq: Two times a day (BID) | ORAL | Status: DC
  Administered 2020-10-30 – 2020-11-04 (×11): 5 mg via ORAL
  Filled 2020-10-29 (×13): qty 1

## 2020-10-29 MED ORDER — SODIUM CHLORIDE 0.9% FLUSH: 3.0000 mL | Freq: Two times a day (BID) | INTRAVENOUS | Status: DC

## 2020-10-29 MED ORDER — VERAPAMIL HCL 2.5 MG/ML IV SOLN
INTRAVENOUS | Status: DC | PRN
  Administered 2020-10-29: 10 mL via INTRA_ARTERIAL

## 2020-10-29 MED ORDER — FENTANYL CITRATE (PF) 100 MCG/2ML IJ SOLN
INTRAMUSCULAR | Status: DC | PRN
  Administered 2020-10-29: 50 ug via INTRAVENOUS

## 2020-10-29 MED ORDER — MIDAZOLAM HCL 2 MG/2ML IJ SOLN
INTRAMUSCULAR | Status: AC
  Filled 2020-10-29: qty 2

## 2020-10-29 MED ORDER — HEPARIN (PORCINE) IN NACL 1000-0.9 UT/500ML-% IV SOLN
INTRAVENOUS | Status: DC | PRN
  Administered 2020-10-29 (×2): 500 mL

## 2020-10-29 MED ORDER — IOHEXOL 350 MG/ML SOLN
INTRAVENOUS | Status: DC | PRN
  Administered 2020-10-29: 30 mL

## 2020-10-29 MED ORDER — SODIUM CHLORIDE 0.9 % IV SOLN: INTRAVENOUS | Status: AC

## 2020-10-29 SURGICAL SUPPLY — 11 items
CATH 5FR JL3.5 JR4 ANG PIG MP (CATHETERS) ×2 IMPLANT
CATH BALLN WEDGE 5F 110CM (CATHETERS) ×2 IMPLANT
DEVICE RAD COMP TR BAND LRG (VASCULAR PRODUCTS) ×2 IMPLANT
GLIDESHEATH SLEND A-KIT 6F 22G (SHEATH) ×2 IMPLANT
GUIDEWIRE INQWIRE 1.5J.035X260 (WIRE) ×1 IMPLANT
INQWIRE 1.5J .035X260CM (WIRE) ×2
KIT HEART LEFT (KITS) ×2 IMPLANT
PACK CARDIAC CATHETERIZATION (CUSTOM PROCEDURE TRAY) ×2 IMPLANT
SHEATH GLIDE SLENDER 4/5FR (SHEATH) ×2 IMPLANT
TRANSDUCER W/STOPCOCK (MISCELLANEOUS) ×2 IMPLANT
TUBING CIL FLEX 10 FLL-RA (TUBING) ×2 IMPLANT

## 2020-10-29 NOTE — Plan of Care (Signed)
  Problem: Clinical Measurements: Goal: Respiratory complications will improve Outcome: Progressing   Problem: Activity: Goal: Risk for activity intolerance will decrease Outcome: Progressing   Problem: Nutrition: Goal: Adequate nutrition will be maintained Outcome: Progressing   Problem: Coping: Goal: Level of anxiety will decrease Outcome: Progressing   Problem: Elimination: Goal: Will not experience complications related to bowel motility Outcome: Progressing Goal: Will not experience complications related to urinary retention Outcome: Progressing   Problem: Pain Managment: Goal: General experience of comfort will improve Outcome: Progressing   Problem: Skin Integrity: Goal: Risk for impaired skin integrity will decrease Outcome: Progressing   

## 2020-10-29 NOTE — Progress Notes (Signed)
*  PRELIMINARY RESULTS* Echocardiogram 2D Echocardiogram has been performed with [facility]inity.  Neomia Dear RDCS 10/29/2020, 3:17 PM

## 2020-10-29 NOTE — Consult Note (Addendum)
[address]       [place] [address]             [phone]        Deryl Giroux [facility] Medical Record #[id number] Date of Birth: 1951/04/10  Referring: Dr. Rosemary Holms, MD Primary Care: Copland, Gwenlyn Found, MD Primary Cardiologist:None  Chief Complaint:    Chief Complaint  Patient presents with  . Shortness of Breath    History of Present Illness:     This is a 70 year old African American male with a past medical history of remote tobacco abuse, possible seizure, anemia/possibleGI bleed who presented to [facility] ED with complaints of progressive shortness of breath. Patient has had progressive shortness of breath, which began last Friday 04/29. He denies chest pain, coughing LE edema, fever. He also had orthopnea so he went to his primary care physician on 05/ 02. Initially, EMS called a code STEMI but Dr. Rosemary Holms reviewed the EKG and cancelled code STEMI. CXR showed borderline mild congestive heart failure and trace right pleural effusion and initial BNP was 1827. In addition, initial Troponin I (high sensitivity) was 656 and went up to 746. Patient did rule in a for a NSTEMI. Patient was given IV Lasix and Coreg. Patient underwent a cardiac catheterization on 10/29/2020 which showed a 90% proximal stenosis of the LAD, 95% stenosis in first Diagonal, a 95% stenosis of the ostial Circumflex to Proximal Circumflex, and an 80% proximal RCA stenosis with a 100% proximal to distal RCA stenosis, and a 70% stenosis of the RPDA. Echocardiogram has been ordered but results are not currently available. At the time of my exam, patient denied shortness of breath or chest pain.   Current Activity/ Functional Status: Patient is independent with mobility/ambulation, transfers, ADL's, IADL's.   Zubrod Score: At the time of surgery this patient's most appropriate activity status/level should be described as: []     0    Normal activity, no symptoms [x]     1     Restricted in physical strenuous activity but ambulatory, able to do out light work []     2    Ambulatory and capable of self care, unable to do work activities, up and about more than 50%  Of the time                            []     3    Only limited self care, in bed greater than 50% of waking hours []     4    Completely disabled, no self care, confined to bed or chair []     5    Moribund  Past Medical History:  Diagnosis Date  . Anemia   . Asthma    childhood  . Blood in stool   . Childhood asthma   . GI bleed   . Kidney laceration 1999  . Seizures (HCC)     Past Surgical History:  Procedure Laterality Date  . KIDNEY SURGERY     for laceration    Social History   Tobacco Use  Smoking Status Former Smoker  Smokeless Tobacco Never Used  Tobacco Comment   quit in 90's    Social History   Substance and Sexual Activity  Alcohol Use Yes   Comment: 1 beer/month  Patient is a for . He is widowed (his wife died in 25-Oct-2017, had Lupus) and has a  dog at home.   Allergies  Allergen Reactions  . Penicillins Other (See Comments)    childhood    Current Facility-Administered Medications  Medication Dose Route Frequency Provider Last Rate Last Admin  . 0.9 %  sodium chloride infusion  250 mL Intravenous PRN Mikey College T, MD      . 0.9 %  sodium chloride infusion   Intravenous Continuous Elder Negus, MD 50 mL/hr at 10/29/20 0850 Rate Change at 10/29/20 0850  . 0.9 %  sodium chloride infusion  250 mL Intravenous PRN Patwardhan, Manish J, MD      . acetaminophen (TYLENOL) tablet 650 mg  650 mg Oral Q4H PRN Mikey College T, MD      . aspirin EC tablet 81 mg  81 mg Oral Daily Mikey College T, MD      . carvedilol (COREG) tablet 3.125 mg  3.125 mg Oral BID WC Mikey College T, MD      . furosemide (LASIX) injection 40 mg  40 mg Intravenous Daily Mikey College T, MD   40 mg at 10/29/20 0859  . heparin ADULT infusion 100 units/mL (25000 units/249mL)  1,100  Units/hr Intravenous Continuous Juliette Mangle, RPH 11 mL/hr at 10/29/20 0405 1,100 Units/hr at 10/29/20 0405  . hydrALAZINE (APRESOLINE) injection 10 mg  10 mg Intravenous Q20 Min PRN Patwardhan, Manish J, MD      . ipratropium-albuterol (DUONEB) 0.5-2.5 (3) MG/3ML nebulizer solution 3 mL  3 mL Nebulization Q6H PRN Mikey College T, MD      . labetalol (NORMODYNE) injection 10 mg  10 mg Intravenous Q10 min PRN Patwardhan, Manish J, MD      . ondansetron (ZOFRAN) injection 4 mg  4 mg Intravenous Q6H PRN Mikey College T, MD      . sodium chloride flush (NS) 0.9 % injection 3 mL  3 mL Intravenous Q12H Mikey College T, MD   3 mL at 10/28/20 2156  . sodium chloride flush (NS) 0.9 % injection 3 mL  3 mL Intravenous PRN Mikey College T, MD      . sodium chloride flush (NS) 0.9 % injection 3 mL  3 mL Intravenous Q12H Patwardhan, Manish J, MD      . sodium chloride flush (NS) 0.9 % injection 3 mL  3 mL Intravenous PRN Patwardhan, Manish J, MD        Medications Prior to Admission  Medication Sig Dispense Refill Last Dose  . olmesartan (BENICAR) 20 MG tablet Take 20 mg by mouth daily.   Past Week at Unknown time    Family History  Problem Relation Age of Onset  . Cervical cancer Mother   . Hypertension Father   . Colon cancer Neg Hx   . Pancreatic cancer Neg Hx   . Esophageal cancer Neg Hx   . Stomach cancer Neg Hx   Patient states one brother has had stents placed and another brother has also had a heart problem  Review of Systems:     Cardiac Review of Systems: Y or  [  N  ]= no  Chest Pain [  N  ]  Resting SOB [ N  ] Exertional SOB  [Y  ]  Orthopnea [ Y ]   Pedal Edema [  N ]      General Review of Systems: [Y] = yes [ N ]=no Constitional: nausea Klaus.Mock  ]; night sweats [ N ]; fever Klaus.Mock  ]; or chills [ N ]  Eye :  diplopia [  N ];   Amaurosis fugax[ N ]; Resp: cough [ N ];  wheezing[ N ];  hemoptysis[ N ];  GI:   vomiting[ N ];   dysphagia[N  ]; melena[ N ];  hematochezia [  N];;   Hx of  Colonoscopy[ Y ]; GU:  hematuria[ N ];                Skin: rash, swelling[  ];, hair loss[  ];  peripheral edema[  ];  or itching[  ]; Musculosketetal: myalgias[  ];  joint swelling[  ];  joint erythema[  ]; joint pain[  ];  back pain[  ];  Heme/Lymph: bruising[  ];  bleeding[  ];  anemia[  ];  Neuro: TIA[  N];    stroke[ N ];  vertigo[ N ];   difficulty walking[N  ];  Endocrine: diabetes[N  ];  thyroid dysfunction[ N ];                Physical Exam: BP 92/71   Pulse 79   Temp (!) 97.4 F (36.3 C) (Oral)   Resp 16   Ht 6\' 1"  (1.854 m)   Wt 70.4 kg   SpO2 98%   BMI 20.48 kg/m    General appearance: alert, cooperative and no distress Head: Normocephalic, without obvious abnormality, atraumatic Neck: no carotid bruit, no JVD and supple, symmetrical, trachea midline Resp: clear to auscultation bilaterally Cardio: RRR, no murmur GI: Soft, non tender, bowel sounds present Extremities: No LE edema. Palpable DP/PT bilaterally Neurologic: Grossly normal  Diagnostic Studies & Laboratory data:     Recent Radiology Findings:   CARDIAC CATHETERIZATION  Result Date: 10/29/2020 LM: Long vessel. Normal LAD: Prox 90% stenosis, followed by flush occlusion after Diag 1        No antegrade flow seen in LAD        Faint collaterals from RPL fill small caliber LAD        Diag 1 with prox tandem 95% stenoses LCx: Prox tandem 95% stenoses        Prox OM1 60% stenosis        Mid LCx 60% stenosis RCA: Prox 90% stenosis, followed by 100% occlusion         Bridging collaterals faintly fill mid RCA, and reconstitute distal RCA         Prox RPDA 70% stenosis, followed by 50 stenosis CVTS consultation for CABG Continue heparin Echocardiogram pending Elder NegusManish J Patwardhan, MD Pager: [phone] Office: [phone]   Findings   Diagnostic Dominance: Right  Left Anterior Descending  Prox LAD lesion is 90% stenosed.  Prox LAD to Mid LAD lesion  is 100% stenosed.  First Diagonal Branch  1st Diag-1 lesion is 95% stenosed.  1st Diag-2 lesion is 95% stenosed.  Second Septal Branch  Third Septal Branch  Collaterals  3rd Sept filled by collaterals from 3rd RPL.    Left Circumflex  Ost Cx to Prox Cx lesion is 95% stenosed.  Prox Cx lesion is 95% stenosed.  Mid Cx lesion is 60% stenosed.  First Obtuse Marginal Branch  1st Mrg lesion is 60% stenosed.  Right Coronary Artery  Collaterals  Dist RCA filled by collaterals from Prox RCA.    Prox RCA lesion is 80% stenosed.  Prox RCA to Dist RCA lesion is 100% stenosed.  Right Posterior Descending Artery  RPDA-1 lesion is 70% stenosed.  RPDA-2 lesion is 50% stenosed.    Coronary Diagrams  Diagnostic Dominance: Right        DG Chest Port 1 View  Result Date: 10/28/2020 CLINICAL DATA:  Dyspnea EXAM: PORTABLE CHEST 1 VIEW COMPARISON:  None. FINDINGS: Mild enlargement of the cardiopericardial silhouette. Atherosclerotic aortic arch. Otherwise normal mediastinal contour. No pneumothorax. Trace right pleural effusion. No left pleural effusion. Borderline mild pulmonary edema. No acute consolidative airspace disease. IMPRESSION: Borderline mild congestive heart failure. Trace right pleural effusion. Electronically Signed   By: Delbert Phenix M.D.   On: 10/28/2020 14:50     I have independently reviewed the above radiologic studies and discussed with the patient   Recent Lab Findings: Lab Results  Component Value Date   WBC 5.1 10/29/2020   HGB 12.9 (L) 10/29/2020   HCT 38.0 (L) 10/29/2020   PLT 289 10/29/2020   GLUCOSE 98 10/29/2020   CHOL 306 (H) 10/28/2020   TRIG 51 10/28/2020   HDL 40 (L) 10/28/2020   LDLCALC 256 (H) 10/28/2020   ALT 20 11/24/2019   AST 18 11/24/2019   NA 140 10/29/2020   K 3.7 10/29/2020   CL 104 10/29/2020   CREATININE 1.31 (H) 10/29/2020   BUN 18 10/29/2020   CO2 23 10/29/2020   HGBA1C 5.9 (H) 10/28/2020   Assessment / Plan:   1. S/p NSTEMI,  coronary artery disease-Dr. Cliffton Asters to review catheterization films. Awiat echo to determine whether or not valvular disease present and to help determine LVEF. Patient would likely benefit from coronary artery bypass grafting surgery. He is tentatively scheduled for Thursday 05/05. 2. History of possible seizure-occurred in May 2021. Patient was seen by [facility]rolog[facility]. MRI showed moderate, chronic small vessel ischemic disease. He then had an EEG which showed no epileptiform discharge noted. Patient advised that per [place] DM[place], no driving until syncope/seizre free for 6 months (he is a Naval architect for [employer]).  3. History of normocytic anemia,possible GI bleed- Patient had a syncopal episode and vomited while in [place] in 2018. CT scan of the abdomen and pelvis done in [place] by Dr. Durel Salts note showed possible proctitis and question inflammation of the seminal vesicles. He had a blood transfusion, but no hematochezia or melena. Patient was referred to [facility] GI and seen in June 2018. He then underwent a colonoscopy and was found to have non-bleeding internal hemorrhoids and a few diverticula in the sigmoid, descending, and transverse colon. He was treated with Pylera for H. Pylori.    I  spent 20 minutes counseling the patient face to face.   Doree Fudge PA-C 10/29/2020 12:31 PM   Agree with above 70 yo male with 3V CAD and severe LV dysfunction with an EF < 20%.  He presents mostly with heart failure symptoms, and his septum appears thinned out on echo.  A cardiac MRI to assess for viability will be required to determine if he is a surgical candidate.  Would also recommend consulting advanced heart failure to assist with his care.  Samella Lucchetti Keane Scrape

## 2020-10-29 NOTE — Interval H&P Note (Signed)
History and Physical Interval Note:  10/29/2020 7:41 AM  Troy Johnson  has presented today for surgery, with the diagnosis of nstemi - heart failure.  The various methods of treatment have been discussed with the patient and family. After consideration of risks, benefits and other options for treatment, the patient has consented to  Procedure(s): RIGHT/LEFT HEART CATH AND CORONARY ANGIOGRAPHY (N/A) as a surgical intervention.  The patient's history has been reviewed, patient examined, no change in status, stable for surgery.  I have reviewed the patient's chart and labs.  Questions were answered to the patient's satisfaction.    2012 Appropriate Use Criteria for Diagnostic Catheterization Home / Select Test of Interest Indication for RHC Cardiomyopathies Cardiomyopathies (Right and Left Heart Catheterization OR Right Heart Catheterization Alone With/Wit Cardiomyopathies  (Right and Left Heart Catheterization OR  Right Heart Catheterization Alone With/Without Left Ventriculography and Coronary Angiography)  Link Here: PlayerPointers.cz Indication:  Known or suspected cardiomyopathy with or without heart failure A (7) Indication: 93; Score 7   Nyisha Clippard J Derya Dettmann

## 2020-10-29 NOTE — Progress Notes (Signed)
ANTICOAGULATION CONSULT NOTE - Follow Up Consult  Pharmacy Consult for heparin Indication: chest pain/ACS  Allergies  Allergen Reactions  . Penicillins Other (See Comments)    childhood    Patient Measurements: Height: 6\' 1"  (185.4 cm) Weight: 70.4 kg (155 lb 3.2 oz) IBW/kg (Calculated) : 79.9 Heparin Dosing Weight: 77kg  Vital Signs: Temp: 97.4 F (36.3 C) (05/03 0900) Temp Source: Oral (05/03 0900) BP: 92/71 (05/03 1101) Pulse Rate: 79 (05/03 1101)  Labs: Recent Labs    10/28/20 1433 10/28/20 1619 10/28/20 2006 10/29/20 0251 10/29/20 0806 10/29/20 0808 10/29/20 1000  HGB 13.1  --   --  12.8* 12.6* 12.9*  --   HCT 39.0  --   --  37.2* 37.0* 38.0*  --   PLT 272  --   --  289  --   --   --   HEPARINUNFRC  --   --  0.40 0.24*  --   --  0.39  CREATININE 1.23 1.21  --  1.31*  --   --   --   TROPONINIHS 656* 746*  --   --   --   --   --     Estimated Creatinine Clearance: 53 mL/min (A) (by C-G formula based on SCr of 1.31 mg/dL (H)).   Medical History: Past Medical History:  Diagnosis Date  . Anemia   . Asthma    childhood  . Blood in stool   . Childhood asthma   . GI bleed   . Kidney laceration 1999  . Seizures (HCC)    Assessment: 74 YOM presenting with SOB, initial code STEMI cancelled, he is not on anticoagulation.  CBC wnl  S/p cath with CAD and CABG consult  Heparin drip 1100 uts/hr HL 0.39 at goal CBC stable    Goal of Therapy:  Heparin level 0.3-0.7 units/ml Monitor platelets by anticoagulation protocol: Yes   Plan:  Continue heparin drip 1100 uts/hr  Daily Heparin level, CBC    78 Pharm.D. CPP, BCPS Clinical Pharmacist [phone] 10/29/2020 2:29 PM

## 2020-10-29 NOTE — Progress Notes (Signed)
ANTICOAGULATION CONSULT NOTE - Follow Up Consult  Pharmacy Consult for heparin Indication: apical thrombus  Allergies  Allergen Reactions  . Penicillins Other (See Comments)    childhood    Patient Measurements: Height: 6\' 1"  (185.4 cm) Weight: 70.4 kg (155 lb 3.2 oz) IBW/kg (Calculated) : 79.9 Heparin Dosing Weight: 77kg  Vital Signs: Temp: 98.3 F (36.8 C) (05/03 1534) Temp Source: Oral (05/03 1534) BP: 101/56 (05/03 1716) Pulse Rate: 92 (05/03 1716)  Labs: Recent Labs    10/28/20 1433 10/28/20 1619 10/28/20 2006 10/29/20 0251 10/29/20 0806 10/29/20 0808 10/29/20 1000  HGB 13.1  --   --  12.8* 12.6* 12.9*  --   HCT 39.0  --   --  37.2* 37.0* 38.0*  --   PLT 272  --   --  289  --   --   --   HEPARINUNFRC  --   --  0.40 0.24*  --   --  0.39  CREATININE 1.23 1.21  --  1.31*  --   --   --   TROPONINIHS 656* 746*  --   --   --   --   --     Estimated Creatinine Clearance: 53 mL/min (A) (by C-G formula based on SCr of 1.31 mg/dL (H)).   Medical History: Past Medical History:  Diagnosis Date  . Anemia   . Asthma    childhood  . Blood in stool   . Childhood asthma   . GI bleed   . Kidney laceration 1999  . Seizures (HCC)    Assessment: 54 YOM presenting with SOB, initial code STEMI cancelled, he is not on anticoagulation.  CBC wnl  S/p cath with CAD and CABG consult   Patient now with an apical thrombus.   Heparin drip restarted at 1950   Goal of Therapy:  Heparin level 0.3-0.7 units/ml Monitor platelets by anticoagulation protocol: Yes   Plan:  Restart heparin drip 1100 uts/hr  Check with am labs Daily Heparin level, CBC  78, PharmD, [facility] Clinical Pharmacist Please see AMION for all Pharmacists' Contact Phone Numbers 10/29/2020, 7:52 PM

## 2020-10-29 NOTE — Progress Notes (Signed)
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 Started corlanor 5 mg bid. Discontinued coreg for now.  BP soft to start any other GDMT. Not in shock. Discussed with CVTS. As requested by them, will get cardiac MRI for viability testing and request heart failure team consult.   Elder Negus, MD Pager: [phone] Office: [phone]

## 2020-10-29 NOTE — Progress Notes (Signed)
His BP has been soft.  Held morning dose of carvedilol.   Hinton Dyer, RN

## 2020-10-29 NOTE — Progress Notes (Signed)
[facility]  PROGRESS NOTE  Troy Johnson DPO:[id number] DOB: 08-29-1950 DOA: 10/28/2020 PCP: [name], MD   Brief HPI:   70 year old male with medical history of hypertension presents with shortness of breath.  Patient states that last Friday he started having shortness of breath initially had exertional dyspnea then developed orthopnea for past 2 nights.  Initial troponin was 656, potassium 5.9, chest x-ray showed cardiomegaly and pulmonary vascular congestion.  EKG showed ST depression T wave inversion V5 V6.  Cardiology was consulted, patient received 1 dose of Lasix with improvement in symptoms.  Heparin drip was started in the ED.  Patient went for cardiac catheterization this morning which showed multivessel disease.  CVTS was consulted for CABG.   Subjective   Patient seen and examined, denies chest pain or shortness of breath.   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  Cardiovascular thoracic surgery has been consulted for CABG. 2. Acute decompensated CHF-unknown whether systolic or diastolic.  Echocardiogram obtained today.  Will follow results.  Patient on Lasix 40 mg IV daily.  Follow BMP in am. 3. Acute coronary syndrome-patient presented with elevated troponin, he is on IV heparin.  Continue aspirin, coreg.  Underwent left and right heart catheterization, plan for CABG as above. 4. Hyperlipidemia-LDL cholesterol is 256.  Sample was obtained around 8 PM last night.  Will obtain fasting lipid panel in a.m.  Also will initiate Lipitor 40 mg daily.   Scheduled medications:   . aspirin EC  81 mg Oral Daily  . carvedilol  3.125 mg Oral BID WC  . furosemide  40 mg Intravenous Daily  . sodium chloride flush  3 mL Intravenous Q12H  . sodium chloride flush  3 mL Intravenous Q12H         Data Reviewed:   CBG:  No results for input(s):  GLUCAP in the last 168 hours.  SpO2: 98 % O2 Flow Rate (L/min): 2 L/min    Vitals:   10/29/20 0739 10/29/20 0900 10/29/20 1031 10/29/20 1101  BP:  97/67 (!) 87/62 92/71  Pulse:  75 96 79  Resp:      Temp:  (!) 97.4 F (36.3 C)    TempSrc:  Oral    SpO2: 98% 96% 99% 98%  Weight:      Height:         Intake/Output Summary (Last 24 hours) at 10/29/2020 1510 Last data filed at 10/29/2020 1000 Gross per 24 hour  Intake 829.6 ml  Output 1860 ml  Net -1030.4 ml    05/01 1901 - 05/03 0700 In: 369.6 [P.O.:220; I.V.:149.6] Out: 900 [Urine:900]  Filed Weights   10/28/20 1956 10/29/20 0413  Weight: 70.9 kg 70.4 kg    CBC:  Recent Labs  Lab 10/28/20 1433 10/29/20 0251 10/29/20 0806 10/29/20 0808  WBC 4.7 5.1  --   --   HGB 13.1 12.8* 12.6* 12.9*  HCT 39.0 37.2* 37.0* 38.0*  PLT 272 289  --   --   MCV 97.0 94.7  --   --   MCH 32.6 32.6  --   --   MCHC 33.6 34.4  --   --   RDW 15.1 14.8  --   --   LYMPHSABS 1.7  --   --   --   MONOABS 0.5  --   --   --   EOSABS 0.1  --   --   --  BASOSABS 0.0  --   --   --     Complete metabolic panel:  Recent Labs  Lab 10/28/20 1433 10/28/20 1619 10/28/20 2006 10/29/20 0251 10/29/20 0806 10/29/20 0808  NA 136 139  --  137 139 140  K 5.9* 4.2  --  3.7 3.5 3.7  CL 106 108  --  104  --   --   CO2 21* 20*  --  23  --   --   GLUCOSE 106* 92  --  98  --   --   BUN 20 17  --  18  --   --   CREATININE 1.23 1.21  --  1.31*  --   --   CALCIUM 8.9 9.4  --  9.1  --   --   HGBA1C  --   --  5.9*  --   --   --   BNP 1,827.5*  --   --   --   --   --     No results for input(s): LIPASE, AMYLASE in the last 168 hours.  Recent Labs  Lab 10/28/20 1433 10/28/20 1450  BNP 1,827.5*  --   SARSCOV2NAA  --  NEGATIVE    ------------------------------------------------------------------------------------------------------------------ Recent Labs    10/28/20 2006  CHOL 306*  HDL 40*  LDLCALC 256*  TRIG 51  CHOLHDL 7.7    Lab  Results  Component Value Date   HGBA1C 5.9 (H) 10/28/2020   ------------------------------------------------------------------------------------------------------------------ No results for input(s): TSH, T4TOTAL, T3FREE, THYROIDAB in the last 72 hours.  Invalid input(s): FREET3 ------------------------------------------------------------------------------------------------------------------ No results for input(s): VITAMINB12, FOLATE, FERRITIN, TIBC, IRON, RETICCTPCT in the last 72 hours.  Coagulation profile No results for input(s): INR, PROTIME in the last 168 hours. No results for input(s): DDIMER in the last 72 hours.  Cardiac Enzymes No results for input(s): CKTOTAL, CKMB, CKMBINDEX, TROPONINI in the last 168 hours.  ------------------------------------------------------------------------------------------------------------------    Component Value Date/Time   BNP 1,827.5 (H) 10/28/2020 1433     Antibiotics: Anti-infectives (From admission, onward)   None       Radiology Reports  CARDIAC CATHETERIZATION  Result Date: 10/29/2020 LM: Long vessel. Normal LAD: Prox 90% stenosis, followed by flush occlusion after Diag 1        No antegrade flow seen in LAD        Faint collaterals from RPL fill small caliber LAD        Diag 1 with prox tandem 95% stenoses LCx: Prox tandem 95% stenoses        Prox OM1 60% stenosis        Mid LCx 60% stenosis RCA: Prox 90% stenosis, followed by 100% occlusion         Bridging collaterals faintly fill mid RCA, and reconstitute distal RCA         Prox RPDA 70% stenosis, followed by 50 stenosis CVTS consultation for CABG Continue heparin Echocardiogram pending Elder NegusManish J Patwardhan, MD Pager: [phone] Office: [phone]   DG Chest Port 1 View  Result Date: 10/28/2020 CLINICAL DATA:  Dyspnea EXAM: PORTABLE CHEST 1 VIEW COMPARISON:  None. FINDINGS: Mild enlargement of the cardiopericardial silhouette. Atherosclerotic aortic arch. Otherwise normal  mediastinal contour. No pneumothorax. Trace right pleural effusion. No left pleural effusion. Borderline mild pulmonary edema. No acute consolidative airspace disease. IMPRESSION: Borderline mild congestive heart failure. Trace right pleural effusion. Electronically Signed   By: Delbert PhenixJason A Poff M.D.   On: 10/28/2020 14:50      DVT prophylaxis: Patient  on heparin  Code Status: Full code  Family Communication: No family at bedside   Consultants:    Procedures:      Objective    Physical Examination:    General-appears in no acute distress  Heart-S1-S2, regular, no murmur auscultated  Lungs-clear to auscultation bilaterally, no wheezing or crackles auscultated  Abdomen-soft, nontender, no organomegaly  Extremities-no edema in the lower extremities  Neuro-alert, oriented x3, no focal deficit noted   Status is: Inpatient  Dispo: The patient is from: Home              Anticipated d/c is to: To be decided              Anticipated d/c date is: 11/05/2020              Patient currently not stable for discharge  Barrier to discharge-plan for CABG  COVID-19 Labs  No results for input(s): DDIMER, FERRITIN, LDH, CRP in the last 72 hours.  Lab Results  Component Value Date   SARSCOV2NAA NEGATIVE 10/28/2020    Microbiology  Recent Results (from the past 240 hour(s))  Resp Panel by RT-PCR (Flu A&B, Covid) Nasopharyngeal Swab     Status: None   Collection Time: 10/28/20  2:50 PM   Specimen: Nasopharyngeal Swab; Nasopharyngeal(NP) swabs in vial transport medium  Result Value Ref Range Status   SARS Coronavirus 2 by RT PCR NEGATIVE NEGATIVE Final    Comment: (NOTE) SARS-CoV-2 target nucleic acids are NOT DETECTED.  The SARS-CoV-2 RNA is generally detectable in upper respiratory specimens during the acute phase of infection. The lowest concentration of SARS-CoV-2 viral copies this assay can detect is 138 copies/mL. A negative result does not preclude  SARS-Cov-2 infection and should not be used as the sole basis for treatment or other patient management decisions. A negative result may occur with  improper specimen collection/handling, submission of specimen other than nasopharyngeal swab, presence of viral mutation(s) within the areas targeted by this assay, and inadequate number of viral copies(<138 copies/mL). A negative result must be combined with clinical observations, patient history, and epidemiological information. The expected result is Negative.  Fact Sheet for Patients:  BloggerCourse.com  Fact Sheet for Healthcare Providers:  SeriousBroker.it  This test is no t yet approved or cleared by the [place] FDA and  has been authorized for detection and/or diagnosis of SARS-CoV-2 by FDA under an Emergency Use Authorization (EUA). This EUA will remain  in effect (meaning this test can be used) for the duration of the COVID-19 declaration under Section 564(b)(1) of the Act, 21 U.S.C.section 360bbb-3(b)(1), unless the authorization is terminated  or revoked sooner.       Influenza A by PCR NEGATIVE NEGATIVE Final   Influenza B by PCR NEGATIVE NEGATIVE Final    Comment: (NOTE) The Xpert Xpress SARS-CoV-2/FLU/RSV plus assay is intended as an aid in the diagnosis of influenza from Nasopharyngeal swab specimens and should not be used as a sole basis for treatment. Nasal washings and aspirates are unacceptable for Xpert Xpress SARS-CoV-2/FLU/RSV testing.  Fact Sheet for Patients: BloggerCourse.com  Fact Sheet for Healthcare Providers: SeriousBroker.it  This test is not yet approved or cleared by the [place] FDA and has been authorized for detection and/or diagnosis of SARS-CoV-2 by FDA under an Emergency Use Authorization (EUA). This EUA will remain in effect (meaning this test can be used) for the duration of  the COVID-19 declaration under Section 564(b)(1) of the Act, 21 U.S.C. section 360bbb-3(b)(1), unless the authorization  is terminated or revoked.  Performed at [facility], [address] [place], [place] [address]          Meredeth Ide   [facility] If 7PM-7AM, please contact night-coverage at [email or web address], Office  [phone]   10/29/2020, 3:10 PM  LOS: 1 day

## 2020-10-30 ENCOUNTER — Inpatient Hospital Stay (HOSPITAL_COMMUNITY): Payer: Medicare Other

## 2020-10-30 ENCOUNTER — Encounter (HOSPITAL_COMMUNITY): Payer: Self-pay | Admitting: Cardiology

## 2020-10-30 DIAGNOSIS — I5021 Acute systolic (congestive) heart failure: Secondary | ICD-10-CM

## 2020-10-30 DIAGNOSIS — Z01810 Encounter for preprocedural cardiovascular examination: Secondary | ICD-10-CM | POA: Diagnosis not present

## 2020-10-30 DIAGNOSIS — I5020 Unspecified systolic (congestive) heart failure: Secondary | ICD-10-CM

## 2020-10-30 LAB — RAPID URINE DRUG SCREEN, HOSP PERFORMED
Amphetamines: NOT DETECTED
Barbiturates: NOT DETECTED
Benzodiazepines: POSITIVE — AB
Cocaine: NOT DETECTED
Opiates: NOT DETECTED
Tetrahydrocannabinol: NOT DETECTED

## 2020-10-30 LAB — URINALYSIS, ROUTINE W REFLEX MICROSCOPIC
Bilirubin Urine: NEGATIVE
Glucose, UA: NEGATIVE mg/dL
Hgb urine dipstick: NEGATIVE
Ketones, ur: NEGATIVE mg/dL
Leukocytes,Ua: NEGATIVE
Nitrite: NEGATIVE
Protein, ur: NEGATIVE mg/dL
Specific Gravity, Urine: 1.012 (ref 1.005–1.030)
pH: 6 (ref 5.0–8.0)

## 2020-10-30 LAB — HEPARIN LEVEL (UNFRACTIONATED)
Heparin Unfractionated: 0.29 IU/mL — ABNORMAL LOW (ref 0.30–0.70)
Heparin Unfractionated: 0.2 IU/mL — ABNORMAL LOW (ref 0.30–0.70)

## 2020-10-30 LAB — CBC
HCT: 36.1 % — ABNORMAL LOW (ref 39.0–52.0)
Hemoglobin: 12.4 g/dL — ABNORMAL LOW (ref 13.0–17.0)
MCH: 32.7 pg (ref 26.0–34.0)
MCHC: 34.3 g/dL (ref 30.0–36.0)
MCV: 95.3 fL (ref 80.0–100.0)
Platelets: 275 10*3/uL (ref 150–400)
RBC: 3.79 MIL/uL — ABNORMAL LOW (ref 4.22–5.81)
RDW: 14.8 % (ref 11.5–15.5)
WBC: 4.6 10*3/uL (ref 4.0–10.5)
nRBC: 0 % (ref 0.0–0.2)

## 2020-10-30 LAB — BASIC METABOLIC PANEL
Anion gap: 10 (ref 5–15)
BUN: 26 mg/dL — ABNORMAL HIGH (ref 8–23)
CO2: 23 mmol/L (ref 22–32)
Calcium: 9.2 mg/dL (ref 8.9–10.3)
Chloride: 105 mmol/L (ref 98–111)
Creatinine, Ser: 1.32 mg/dL — ABNORMAL HIGH (ref 0.61–1.24)
GFR, Estimated: 58 mL/min — ABNORMAL LOW (ref >60)
Glucose, Bld: 99 mg/dL (ref 70–99)
Potassium: 3.7 mmol/L (ref 3.5–5.1)
Sodium: 138 mmol/L (ref 135–145)

## 2020-10-30 LAB — LIPID PANEL
Cholesterol: 257 mg/dL — ABNORMAL HIGH (ref 0–200)
HDL: 40 mg/dL — ABNORMAL LOW (ref >40)
LDL Cholesterol: 204 mg/dL — ABNORMAL HIGH (ref 0–99)
Total CHOL/HDL Ratio: 6.4 RATIO
Triglycerides: 63 mg/dL (ref <150)
VLDL: 13 mg/dL (ref 0–40)

## 2020-10-30 MED ORDER — TRANEXAMIC ACID (OHS) BOLUS VIA INFUSION
15.0000 mg/kg | INTRAVENOUS | Status: DC
  Filled 2020-10-30: qty 1034

## 2020-10-30 MED ORDER — PLASMA-LYTE 148 IV SOLN
INTRAVENOUS | Status: DC
  Filled 2020-10-30: qty 2.5

## 2020-10-30 MED ORDER — EPINEPHRINE HCL 5 MG/250ML IV SOLN IN NS
0.0000 ug/min | INTRAVENOUS | Status: DC
  Filled 2020-10-30: qty 250

## 2020-10-30 MED ORDER — LOSARTAN POTASSIUM 25 MG PO TABS
12.5000 mg | ORAL_TABLET | Freq: Every day | ORAL | Status: DC
  Administered 2020-10-30 – 2020-11-03 (×5): 12.5 mg via ORAL
  Filled 2020-10-30 (×6): qty 1

## 2020-10-30 MED ORDER — GADOBUTROL 1 MMOL/ML IV SOLN
10.0000 mL | Freq: Once | INTRAVENOUS | Status: AC | PRN
  Administered 2020-10-30: 10 mL via INTRAVENOUS

## 2020-10-30 MED ORDER — SODIUM CHLORIDE 0.9 % IV SOLN
INTRAVENOUS | Status: DC
  Filled 2020-10-30: qty 30

## 2020-10-30 MED ORDER — POTASSIUM CHLORIDE CRYS ER 20 MEQ PO TBCR
40.0000 meq | EXTENDED_RELEASE_TABLET | Freq: Once | ORAL | Status: AC
  Administered 2020-10-30: 40 meq via ORAL
  Filled 2020-10-30: qty 2

## 2020-10-30 MED ORDER — MILRINONE LACTATE IN DEXTROSE 20-5 MG/100ML-% IV SOLN
0.3000 ug/kg/min | INTRAVENOUS | Status: DC
  Filled 2020-10-30: qty 100

## 2020-10-30 MED ORDER — DIGOXIN 125 MCG PO TABS
0.1250 mg | ORAL_TABLET | Freq: Every day | ORAL | Status: DC
  Administered 2020-10-30 – 2020-11-04 (×6): 0.125 mg via ORAL
  Filled 2020-10-30 (×6): qty 1

## 2020-10-30 MED ORDER — NITROGLYCERIN IN D5W 200-5 MCG/ML-% IV SOLN
2.0000 ug/min | INTRAVENOUS | Status: AC
  Filled 2020-10-30: qty 250

## 2020-10-30 MED ORDER — NOREPINEPHRINE 4 MG/250ML-% IV SOLN
0.0000 ug/min | INTRAVENOUS | Status: DC
Start: 2020-10-31 — End: 2020-10-31
  Filled 2020-10-30: qty 250

## 2020-10-30 MED ORDER — INSULIN REGULAR(HUMAN) IN NACL 100-0.9 UT/100ML-% IV SOLN
INTRAVENOUS | Status: DC
  Filled 2020-10-30: qty 100

## 2020-10-30 MED ORDER — PHENYLEPHRINE HCL-NACL 20-0.9 MG/250ML-% IV SOLN
30.0000 ug/min | INTRAVENOUS | Status: DC
  Filled 2020-10-30: qty 250

## 2020-10-30 MED ORDER — MANNITOL 20 % IV SOLN
Freq: Once | INTRAVENOUS | Status: DC
  Filled 2020-10-30: qty 13

## 2020-10-30 MED ORDER — ATORVASTATIN CALCIUM 80 MG PO TABS
80.0000 mg | ORAL_TABLET | Freq: Every day | ORAL | Status: DC
  Administered 2020-10-31 – 2020-11-14 (×14): 80 mg via ORAL
  Filled 2020-10-30 (×14): qty 1

## 2020-10-30 MED ORDER — DEXMEDETOMIDINE HCL IN NACL 400 MCG/100ML IV SOLN
0.1000 ug/kg/h | INTRAVENOUS | Status: DC
  Filled 2020-10-30: qty 100

## 2020-10-30 MED ORDER — LEVOFLOXACIN IN D5W 500 MG/100ML IV SOLN
500.0000 mg | INTRAVENOUS | Status: DC
  Filled 2020-10-30: qty 100

## 2020-10-30 MED ORDER — VANCOMYCIN HCL 1250 MG/250ML IV SOLN
1250.0000 mg | INTRAVENOUS | Status: DC
  Filled 2020-10-30: qty 250

## 2020-10-30 MED ORDER — TRANEXAMIC ACID (OHS) PUMP PRIME SOLUTION
2.0000 mg/kg | INTRAVENOUS | Status: DC
  Filled 2020-10-30: qty 1.38

## 2020-10-30 MED ORDER — TRANEXAMIC ACID 1000 MG/10ML IV SOLN
1.5000 mg/kg/h | INTRAVENOUS | Status: DC
  Filled 2020-10-30: qty 25

## 2020-10-30 MED ORDER — POTASSIUM CHLORIDE 2 MEQ/ML IV SOLN
80.0000 meq | INTRAVENOUS | Status: AC
  Filled 2020-10-30: qty 40

## 2020-10-30 NOTE — Progress Notes (Signed)
ANTICOAGULATION CONSULT NOTE - Follow Up Consult  Pharmacy Consult for heparin Indication: apical thrombus  Allergies  Allergen Reactions  . Penicillins Other (See Comments)    childhood    Patient Measurements: Height: 6\' 1"  (185.4 cm) Weight: 68.9 kg (152 lb) IBW/kg (Calculated) : 79.9 Heparin Dosing Weight: 77kg  Vital Signs: Temp: 97.7 F (36.5 C) (05/04 0811) Temp Source: Oral (05/04 0811) BP: 103/72 (05/04 0811) Pulse Rate: 87 (05/04 0811)  Labs: Recent Labs    10/28/20 1433 10/28/20 1619 10/28/20 2006 10/29/20 0251 10/29/20 0806 10/29/20 0808 10/29/20 1000 10/30/20 0248 10/30/20 1011  HGB 13.1  --   --  12.8* 12.6* 12.9*  --  12.4*  --   HCT 39.0  --   --  37.2* 37.0* 38.0*  --  36.1*  --   PLT 272  --   --  289  --   --   --  275  --   HEPARINUNFRC  --   --    < > 0.24*  --   --  0.39 0.29* 0.20*  CREATININE 1.23 1.21  --  1.31*  --   --   --  1.32*  --   TROPONINIHS 656* 746*  --   --   --   --   --   --   --    < > = values in this interval not displayed.    Estimated Creatinine Clearance: 51.5 mL/min (A) (by C-G formula based on SCr of 1.32 mg/dL (H)).   Medical History: Past Medical History:  Diagnosis Date  . Anemia   . Asthma    childhood  . Blood in stool   . Childhood asthma   . GI bleed   . Kidney laceration 1999  . Seizures (HCC)    Assessment: 68 yoM admitted with SOB found to have mvCAD and apical thrombus. Pt started on IV heparin, no AC PTA.  Heparin level subtherapeutic at 0.2, no infusion issues noted, CBC stable this am.  Goal of Therapy:  Heparin level 0.3-0.7 units/ml Monitor platelets by anticoagulation protocol: Yes   Plan:  Increase heparin to 1400 units/h Daily heparin level and CBC   78, PharmD, [place], [facility] Clinical Pharmacist [phone] Please check AMION for all [facility] numbers 10/30/2020

## 2020-10-30 NOTE — Progress Notes (Signed)
ANTICOAGULATION CONSULT NOTE - Follow Up Consult  Pharmacy Consult for heparin Indication: apical thrombus and 3vCAD  Labs: Recent Labs    10/28/20 1433 10/28/20 1619 10/28/20 2006 10/29/20 0251 10/29/20 0806 10/29/20 0808 10/29/20 1000 10/30/20 0248  HGB 13.1  --   --  12.8* 12.6* 12.9*  --  12.4*  HCT 39.0  --   --  37.2* 37.0* 38.0*  --  36.1*  PLT 272  --   --  289  --   --   --  275  HEPARINUNFRC  --   --    < > 0.24*  --   --  0.39 0.29*  CREATININE 1.23 1.21  --  1.31*  --   --   --  1.32*  TROPONINIHS 656* 746*  --   --   --   --   --   --    < > = values in this interval not displayed.    Assessment: 70yo male slightly subtherapeutic on heparin after resuming post cath; no gtt issues or signs of bleeding per RN; was previously therapeutic at this rate but was at lower end of goal.  Goal of Therapy:  Heparin level 0.3-0.7 units/ml   Plan:  Will increase heparin gtt slightly to 1200 units/hr and check level in 6 hours.    Vernard Gambles, PharmD, BCPS  10/30/2020,4:13 AM

## 2020-10-30 NOTE — Consult Note (Addendum)
Advanced Heart Failure Team Consult Note   Primary Physician: [name], MD PCP-Cardiologist:  Dr. Rosemary Holms   Reason for Consultation: Acute Systolic Heart Failure 2/2 ICM/ HF Optimization prior to potential  CABG   HPI:    Troy Johnson is seen today for new acute systolic heart failure/ heart failure optimization prior to potential CABG, at the request of Dr. Rosemary Holms, Cardiology.   70 y/o AAM w/ h/o anemia, ?prior seizure and former smoker (quit 70 y/o) but no prior cardiac history who presented to the ED on 5/2 w/ CC of dyspnea and found to be in acute CHF and ruled in for NSTEMi, Hs trop, 656>>746. EKG showed inferolateral TWI.   He was admitted and started on IV heparin and IV Lasix. 2D echo showed severely reduced LVEF <20% w/ global HK, GIIDD and small LV apical thrombus, moderate MR and RV mildly reduced. Subsequent R/LHC showed severe MVCAD (angiographic details below) and well compensated hemodynamics w/ low/normal filling pressures and preserved CO. RAP 1 mmHg, PCW 6 mmHg, CO 4.9 L/min, CI 2.5 L/min2. CT surgery consulted for potential CABG. cMRI pending to assess for viability. AHF team consulted to assist w/ HF optimization prior to potential CABG.   He has been started on ARB, losartan 12.5 mg daily + Corlanor for rate control. Remains on IV Lasix. Wt down 4 lb. SCr ok, 1.2>>1.3.   He feels ok currently. Dyspnea improved. Denies CP. NSR HR 70s. BP stable/ controlled.     Echo 10/29/20  1. Left ventricular ejection fraction, by estimation, is <20%. The left ventricle has severely decreased function. The left ventricle demonstrates global hypokinesis. The left ventricular internal cavity size was mildly dilated. Left ventricular diastolic parameters are consistent with Grade II diastolic dysfunction (pseudonormalization). Small LV apical thrombus seen on Definity contrast study. 2. Right ventricular systolic function is mildly reduced. The right ventricular  size is normal. There is normal pulmonary artery systolic pressure. 3. Left atrial size was mildly dilated. 4. Right atrial size was mildly dilated. 5. Mild thciekning of mitral valve leaflets. Moderate mitral valve regurgitation.   [facility] 10/29/20 Angiographic Data  LM: Long vessel. Normal LAD: Prox 90% stenosis, followed by flush occlusion after Diag 1        No antegrade flow seen in LAD        Faint collaterals from RPL fill small caliber LAD        Diag 1 with prox tandem 95% stenoses LCx: Prox tandem 95% stenoses        Prox OM1 60% stenosis        Mid LCx 60% stenosis RCA: Prox 90% stenosis, followed by 100% occlusion         Bridging collaterals faintly fill mid RCA, and reconstitute distal RCA         Prox RPDA 70% stenosis, followed by 50 stenosis  Diagnostic Dominance: Right      RHC Hemodynamics  Right Heart Pressures RA: 1 mmHg RV: 32/0 mmHg PA: 39/14 mmHg, mean PAP 23 mmHg PCW: 6 mmHg    Left Heart  Left Ventricle LV: 101/6 mmHg, LVEDP 13 mmHg CO: 4.9 L/min CI: 2.5 L/min/m2     Review of Systems: [y] = yes,  = no   . General: Weight gain ; Weight loss ; Anorexia ; Fatigue ; Fever ; Chills ; Weakness   . Cardiac: Chest pain/pressure ; Resting SOB [Y]; Exertional SOB [ Y]; Orthopnea [  Y ]; Pedal Edema [ ] ; Palpitations [ ] ; Syncope [ ] ; Presyncope [ ] ; Paroxysmal nocturnal dyspnea[ ]   . Pulmonary: Cough [ ] ; Wheezing[ ] ; Hemoptysis[ ] ; Sputum [ ] ; Snoring [ ]   . GI: Vomiting[ ] ; Dysphagia[ ] ; Melena[ ] ; Hematochezia [ ] ; Heartburn[ ] ; Abdominal pain [ ] ; Constipation [ ] ; Diarrhea [ ] ; BRBPR [ ]   . GU: Hematuria[ ] ; Dysuria [ ] ; Nocturia[ ]   . Vascular: Pain in legs with walking [ ] ; Pain in feet with lying flat [ ] ; Non-healing sores [ ] ; Stroke [ ] ; TIA [ ] ; Slurred speech [ ] ;  . Neuro: Headaches[ ] ; Vertigo[ ] ; Seizures[ ] ; Paresthesias[ ] ;Blurred vision [ ] ; Diplopia [ ] ; Vision changes [ ]   . Ortho/Skin: Arthritis [ ] ; Joint  pain [ ] ; Muscle pain [ ] ; Joint swelling [ ] ; Back Pain [ ] ; Rash [ ]   . Psych: Depression[ ] ; Anxiety[ ]   . Heme: Bleeding problems [ ] ; Clotting disorders [ ] ; Anemia [ ]   . Endocrine: Diabetes [ ] ; Thyroid dysfunction[ ]   Home Medications Prior to Admission medications   Medication Sig Start Date End Date Taking? Authorizing Provider  olmesartan (BENICAR) 20 MG tablet Take 20 mg by mouth daily. 09/23/20  Yes [provider]    Past Medical History: Past Medical History:  Diagnosis Date  . Anemia   . Asthma    childhood  . Blood in stool   . Childhood asthma   . GI bleed   . Kidney laceration 1999  . Seizures (HCC)     Past Surgical History: Past Surgical History:  Procedure Laterality Date  . KIDNEY SURGERY     for laceration  . RIGHT/LEFT HEART CATH AND CORONARY ANGIOGRAPHY N/A 10/29/2020   Procedure: RIGHT/LEFT HEART CATH AND CORONARY ANGIOGRAPHY;  Surgeon: , MD;  Location: [facility] INVASIVE CV LAB;  Service: Cardiovascular;  Laterality: N/A;    Family History: Family History  Problem Relation Age of Onset  . Cervical cancer Mother   . Hypertension Father   . Colon cancer Neg Hx   . Pancreatic cancer Neg Hx   . Esophageal cancer Neg Hx   . Stomach cancer Neg Hx     Social History: Social History   Socioeconomic History  . Marital status: Widowed    Spouse name: Not on file  . Number of children: Not on file  . Years of education: Not on file  . Highest education level: Not on file  Occupational History  . Occupation: truck  Tobacco Use  . Smoking status: Former  . Smokeless tobacco: Never Used  . Tobacco comment: quit in 90's  Substance and Sexual Activity  . Alcohol use: Yes    Comment: 1 beer/month  . Drug use: No  . Sexual activity: Not on file  Other Topics Concern  . Not on file  Social History Narrative   for and windows.     Social Determinants of Health   Financial Resource Strain:  Not on file  Food Insecurity: Not on file  Transportation Needs: Not on file  Physical Activity: Not on file  Stress: Not on file  Social Connections: Not on file    Allergies:  Allergies  Allergen Reactions  . Penicillins Other (See Comments)    childhood    Objective:    Vital Signs:   Temp:  [97.7 F (36.5 C)-98.4 F (36.9 C)] 97.7 F (36.5 C) (05/04 0811) Pulse Rate:  [78-96]  87 (05/04 3016) Resp:  [16-20] 20 (05/04 0811) BP: (87-115)/(56-83) 103/72 (05/04 0811) SpO2:  [96 %-100 %] 98 % (05/04 0811) Weight:  [68.9 kg] 68.9 kg (05/04 0531) Last BM Date: 10/29/20  Weight change: Filed Weights   10/28/20 1956 10/29/20 0413 10/30/20 0531  Weight: 70.9 kg 70.4 kg 68.9 kg    Intake/Output:   Intake/Output Summary (Last 24 hours) at 10/30/2020 0949 Last data filed at 10/30/2020 0600 Gross per 24 hour  Intake 1275.17 ml  Output 1080 ml  Net 195.17 ml      Physical Exam    General:  Well appearing. No resp difficulty HEENT: normal Neck: supple. JVP 6 cm . Carotids 2+ bilat; no bruits. No lymphadenopathy or thyromegaly appreciated. Cor: PMI nondisplaced. Regular rate & rhythm. No rubs, gallops or murmurs. Lungs: clear Abdomen: soft, nontender, nondistended. No hepatosplenomegaly. No bruits or masses. Good bowel sounds. Extremities: no cyanosis, clubbing, rash, edema Neuro: alert & orientedx3, cranial nerves grossly intact. moves all 4 extremities w/o difficulty. Affect pleasant   Telemetry   NSR 70s occasional PVCs   EKG    No new EKG to review   Labs   Basic Metabolic Panel: Recent Labs  Lab 10/28/20 1433 10/28/20 1619 10/29/20 0251 10/29/20 0806 10/29/20 0808 10/30/20 0248  NA 136 139 137 139 140 138  K 5.9* 4.2 3.7 3.5 3.7 3.7  CL 106 108 104  --   --  105  CO2 21* 20* 23  --   --  23  GLUCOSE 106* 92 98  --   --  99  BUN 20 17 18   --   --  26*  CREATININE 1.23 1.21 1.31*  --   --  1.32*  CALCIUM 8.9 9.4 9.1  --   --  9.2    Liver  Function Tests: No results for input(s): AST, ALT, ALKPHOS, BILITOT, PROT, ALBUMIN in the last 168 hours. No results for input(s): LIPASE, AMYLASE in the last 168 hours. No results for input(s): AMMONIA in the last 168 hours.  CBC: Recent Labs  Lab 10/28/20 1433 10/29/20 0251 10/29/20 0806 10/29/20 0808 10/30/20 0248  WBC 4.7 5.1  --   --  4.6  NEUTROABS 2.3  --   --   --   --   HGB 13.1 12.8* 12.6* 12.9* 12.4*  HCT 39.0 37.2* 37.0* 38.0* 36.1*  MCV 97.0 94.7  --   --  95.3  PLT 272 289  --   --  275    Cardiac Enzymes: No results for input(s): CKTOTAL, CKMB, CKMBINDEX, TROPONINI in the last 168 hours.  BNP: BNP (last 3 results) Recent Labs    10/28/20 1433  BNP 1,827.5*    ProBNP (last 3 results) No results for input(s): PROBNP in the last 8760 hours.   CBG: No results for input(s): GLUCAP in the last 168 hours.  Coagulation Studies: No results for input(s): LABPROT, INR in the last 72 hours.   Imaging   ECHOCARDIOGRAM COMPLETE  Result Date: 10/29/2020    ECHOCARDIOGRAM REPORT   Patient Name:   Troy Johnson Date of Exam: 10/29/2020 Medical Rec #:  12/29/2020      Height:       73.0 in Accession #:    [id number]     Weight:       155.2 lb Date of Birth:  March 05, 1951     BSA:          1.932 m Patient Age:    31  years       BP:           102/82 mmHg Patient Gender: M              HR:           94 bpm. Exam Location:  Inpatient Procedure: 2D Echo, Color Doppler, Cardiac Doppler and Intracardiac            Opacification Agent Indications:     CHF-Acute Systolic I50.21  History:         Patient has prior history of Echocardiogram examinations, most                  recent 12/05/2016.  Sonographer:     Neomia DearAMARA CROWN RDCS Referring Phys:  [id number] Emeline GeneralPING T ZHANG Diagnosing Phys: Truett MainlandManish Patwardhan MD IMPRESSIONS  1. Left ventricular ejection fraction, by estimation, is <20%. The left ventricle has severely decreased function. The left ventricle demonstrates global hypokinesis. The left  ventricular internal cavity size was mildly dilated. Left ventricular diastolic parameters are consistent with Grade II diastolic dysfunction (pseudonormalization). Small LV apical thrombus seen on Definity contrast study.  2. Right ventricular systolic function is mildly reduced. The right ventricular size is normal. There is normal pulmonary artery systolic pressure.  3. Left atrial size was mildly dilated.  4. Right atrial size was mildly dilated.  5. Mild thciekning of mitral valve leaflets. Moderate mitral valve regurgitation. FINDINGS  Left Ventricle: Small LV apical thrombus seen on Definity contrast study. Left ventricular ejection fraction, by estimation, is <20%. The left ventricle has severely decreased function. The left ventricle demonstrates global hypokinesis. Definity contrast agent was given IV to delineate the left ventricular endocardial borders. The left ventricular internal cavity size was mildly dilated. There is no left ventricular hypertrophy. Left ventricular diastolic parameters are consistent with Grade II diastolic dysfunction (pseudonormalization). Right Ventricle: The right ventricular size is normal. No increase in right ventricular wall thickness. Right ventricular systolic function is mildly reduced. There is normal pulmonary artery systolic pressure. The tricuspid regurgitant velocity is 2.80 m/s, and with an assumed right atrial pressure of 3 mmHg, the estimated right ventricular systolic pressure is 34.4 mmHg. Left Atrium: Left atrial size was mildly dilated. Right Atrium: Right atrial size was mildly dilated. Pericardium: There is no evidence of pericardial effusion. Mitral Valve: The mitral valve is abnormal. There is mild thickening of the mitral valve leaflet(s). Moderate mitral valve regurgitation. Tricuspid Valve: The tricuspid valve is grossly normal. Tricuspid valve regurgitation is mild. Aortic Valve: The aortic valve is grossly normal. Aortic valve regurgitation is not  visualized. Aortic valve mean gradient measures 2.0 mmHg. Aortic valve peak gradient measures 2.8 mmHg. Aortic valve area, by VTI measures 2.16 cm. Pulmonic Valve: The pulmonic valve was grossly normal. Pulmonic valve regurgitation is not visualized. Aorta: The aortic root and ascending aorta are structurally normal, with no evidence of dilitation. Venous: The inferior vena cava was not well visualized. IAS/Shunts: No atrial level shunt detected by color flow Doppler.  LEFT VENTRICLE PLAX 2D LVIDd:         6.40 cm  Diastology LVIDs:         6.00 cm  LV e' medial:    4.46 cm/s LV PW:         1.20 cm  LV E/e' medial:  10.7 LV IVS:        1.10 cm  LV e' lateral:   5.25 cm/s LVOT diam:     2.10 cm  LV E/e' lateral: 9.1 LV SV:         32 LV SV Index:   17 LVOT Area:     3.46 cm  RIGHT VENTRICLE RV S prime:     11.10 cm/s TAPSE (M-mode): 1.2 cm LEFT ATRIUM             Index       RIGHT ATRIUM           Index LA Vol (A2C):   51.5 ml 26.66 ml/m RA Area:     19.20 cm LA Vol (A4C):   64.4 ml 33.34 ml/m RA Volume:   55.90 ml  28.94 ml/m LA Biplane Vol: 59.8 ml 30.96 ml/m  AORTIC VALVE                   PULMONIC VALVE AV Area (Vmax):    2.57 cm    PV Vmax:       0.58 m/s AV Area (Vmean):   2.54 cm    PV Vmean:      42.100 cm/s AV Area (VTI):     2.16 cm    PV VTI:        0.107 m AV Vmax:           84.10 cm/s  PV Peak grad:  1.3 mmHg AV Vmean:          67.100 cm/s PV Mean grad:  1.0 mmHg AV VTI:            0.150 m AV Peak Grad:      2.8 mmHg AV Mean Grad:      2.0 mmHg LVOT Vmax:         62.50 cm/s LVOT Vmean:        49.300 cm/s LVOT VTI:          0.094 m LVOT/AV VTI ratio: 0.62 MITRAL VALVE                 TRICUSPID VALVE MV Area (PHT): 4.15 cm      TR Peak grad:   31.4 mmHg MV Decel Time: 183 msec      TR Vmax:        280.00 cm/s MR Peak grad:    73.6 mmHg MR Mean grad:    43.0 mmHg   SHUNTS MR Vmax:         429.00 cm/s Systemic VTI:  0.09 m MR Vmean:        304.0 cm/s  Systemic Diam: 2.10 cm MR PISA:         0.57  cm MR PISA Eff ROA: 3 mm MR PISA Radius:  0.30 cm MV E velocity: 47.60 cm/s MV A velocity: 50.60 cm/s MV E/A ratio:  0.94 Manish Patwardhan MD Electronically signed by Truett Mainland MD Signature Date/Time: 10/29/2020/4:55:34 PM    Final       Medications:     Current Medications: . aspirin EC  81 mg Oral Daily  . atorvastatin  40 mg Oral Daily  . furosemide  40 mg Intravenous Daily  . ivabradine  5 mg Oral BID WC  . losartan  12.5 mg Oral Daily  . sodium chloride flush  3 mL Intravenous Q12H  . sodium chloride flush  3 mL Intravenous Q12H     Infusions: . sodium chloride    . sodium chloride    . heparin 1,200 Units/hr (10/30/20 0425)      Assessment/Plan   1. CAD/ NSTEMI: Hs trop 656>>746.  LHC w/ severe MVCAD. Echo LVEF <20%, RV mildly reduced  - CT surgery consulted for potential CABG, pending cMRI for viability  - Currently CP free - Continue IV heparin + asa and high intensity statin  - No  blocker yet - Recommend initiation of MRA, spironolactone 12.5 mg qhs    2. Acute Systolic Heart Failure: Echo LVEF <20% w/ diffuse HK, GIIDD, RV mildly reduced. 2/2 ICM, cath w/ severe MVCAD awaiting cMRI to assess for viability to determine CABG vs PCI . Initial presentation w/ dyspnea/ orthopnea. BNP 1,827 on admit. CXR w/ mild pulmonary edema and trace rt pleural effusion. Volume/ symptoms Improved w/ diuresis. Wt down 4 lb. Filling pressures low normal on RHC yesterday w/ preserved output. RAP 1 mmHg, PCW 6 mmHg, CO 4.9 L/min, CI 2.5 L/min2 - can stop IV Lasix today - start spiro 12.5 mg daily  - continue losartan 12.5 mg daily. BP too soft for Entresto  - no  blocker yet - c/w Corlanor for rate control, 5 mg bid. HR currently 70s   3. LV Thrombus  - noted on TTE, cMRI pending - continue IV heparin   4. Moderate MR - likely functional from dilated LV   Length of Stay: 2  Robbie Lis, PA-C  10/30/2020, 9:49 AM  Advanced Heart Failure Team Pager [phone]  (M-F; 7a - 5p)  Please contact [facility] for night-coverage after hours (4p -7a ) and weekends on [email or web address]  Patient seen with PA, agree with the above note.   Patient has history of HTN, prior smoking, and hyperlipidemia.  He had 2 prior syncopal episodes, last was about a year ago.  Workup in the past was unremarkable.    Patient was admitted with about 2 wks of exertional dyspnea.  He saw his PCP after he had episodes of PND, ECG was abnormal and he was sent to the ER.  HS-TnI was elevated to peak 746.  He never had chest pain.   LHC/RHC was done.  I reviewed the films, severe 3 vessel disease. Filling pressures were low and cardiac index preserved at 2.5.  Echo reviewed, this showed LV systolic dysfunction with EF <20%, the septum and apex were akinetic, RV function looked low normal.   Patient has been evaluated by [facility]CTS for CABG.  He is not volume overloaded on exam and denies dyspnea or chest pain.   General: NAD Neck: No JVD, no thyromegaly or thyroid nodule.  Lungs: Clear to auscultation bilaterally with normal respiratory effort. CV: Lateral PMI.  Heart regular S1/S2, no S3/S4, no murmur.  No peripheral edema.  No carotid bruit.  Normal pedal pulses.  Abdomen: Soft, nontender, no hepatosplenomegaly, no distention.  Skin: Intact without lesions or rashes.  Neurologic: Alert and oriented x 3.  Psych: Normal affect. Extremities: No clubbing or cyanosis.  HEENT: Normal.   1. CAD: No chest pain, presented with dyspnea.  NSTEMI with HS-TnI elevated to peak 746.  Cath showed severe multivessel CAD with reasonable targets for CABG.  However, EF < 20%.   - ASA 81 + atorvastatin 80 mg daily.  - Cardiac MRI today to assess for viability.  Suspect this will be a mixed picture, would favor CABG if a significant amount of at-risk myocardium appears viable.  2. Acute systolic CHF: Ischemic cardiomyopathy.  Echo this admission with EF <20%, the septum and apex were akinetic, RV function  looked low normal, moderate MR.  He is not volume overloaded on exam and RHC showed normal filling  pressures.  CI preserved on RHC at 2.5.   - Continue Corlanor 5 mg bid, HR 70s.  - Add digoxin 0.125 daily.  - Continue losartan 12.5 mg daily, would not increase pre-CABG.  - Would hope for improvement in function with revascularization.  3. LV thrombus: Clearly noted on echo.  - Heparin gtt for now.  4. Mitral regurgitation: Likely functional.  Moderate.  5. Hyperlipidemia: Very high LDL.   - Atorvastatin 80 daily.   Marca Ancona 10/30/2020

## 2020-10-30 NOTE — Progress Notes (Signed)
Patient sleeping and awakened this am. He denies shortness of breath and chest pain (has not had any chest pain prior or during admission).Patient found to have a very depressed LVEF (10-20%) and LV apical thrombus. Await MRI viability scan to help determine if surgical candidate for coronary artery bypass grafting surgery. Per Dr. Cliffton Asters, would also recommend consultation with advanced heart failure team.

## 2020-10-30 NOTE — Progress Notes (Signed)
Subjective:  No chest pain or shortness of breath Awaiting further work-up and management for multivessel CAD  Objective:  Vital Signs in the last 24 hours: Temp:  [97.7 F (36.5 C)-98.4 F (36.9 C)] 97.7 F (36.5 C) (05/04 0811) Pulse Rate:  [78-96] 87 (05/04 0811) Resp:  [16-20] 20 (05/04 0811) BP: (87-115)/(56-83) 103/72 (05/04 0811) SpO2:  [96 %-100 %] 98 % (05/04 0811) Weight:  [68.9 kg] 68.9 kg (05/04 0531)  Intake/Output from previous day: 05/03 0701 - 05/04 0700 In: 1735.2 [P.O.:1180; I.V.:555.2] Out: 1510 [Urine:1510]  Physical Exam Vitals and nursing note reviewed.  Constitutional:      General: He is not in acute distress.    Appearance: He is well-developed.  HENT:     Head: Normocephalic and atraumatic.  Eyes:     Conjunctiva/sclera: Conjunctivae normal.     Pupils: Pupils are equal, round, and reactive to light.  Neck:     Vascular: No JVD.  Cardiovascular:     Rate and Rhythm: Normal rate and regular rhythm.     Pulses: Normal pulses and intact distal pulses.     Heart sounds: No murmur heard.   Pulmonary:     Effort: Pulmonary effort is normal.     Breath sounds: Normal breath sounds. No wheezing or rales.  Abdominal:     General: Bowel sounds are normal.     Palpations: Abdomen is soft.     Tenderness: There is no rebound.  Musculoskeletal:        General: No tenderness. Normal range of motion.     Left lower leg: No edema.  Lymphadenopathy:     Cervical: No cervical adenopathy.  Skin:    General: Skin is warm and dry.  Neurological:     Mental Status: He is alert and oriented to person, place, and time.     Cranial Nerves: No cranial nerve deficit.      Lab Results: BMP Recent Labs    11/24/19 1102 10/28/20 1433 10/28/20 1619 10/29/20 0251 10/29/20 0806 10/29/20 0808 10/30/20 0248  NA 143   < > 139 137 139 140 138  K 5.3*   < > 4.2 3.7 3.5 3.7 3.7  CL 102   < > 108 104  --   --  105  CO2 26   < > 20* 23  --   --  23  GLUCOSE  87   < > 92 98  --   --  99  BUN 16   < > 17 18  --   --  26*  CREATININE 1.12   < > 1.21 1.31*  --   --  1.32*  CALCIUM 10.4*   < > 9.4 9.1  --   --  9.2  GFRNONAA 67   < > >60 59*  --   --  58*  GFRAA 78  --   --   --   --   --   --    < > = values in this interval not displayed.    CBC Recent Labs  Lab 10/28/20 1433 10/29/20 0251 10/30/20 0248  WBC 4.7   < > 4.6  RBC 4.02*   < > 3.79*  HGB 13.1   < > 12.4*  HCT 39.0   < > 36.1*  PLT 272   < > 275  MCV 97.0   < > 95.3  MCH 32.6   < > 32.7  MCHC 33.6   < > 34.3  RDW 15.1   < > 14.8  LYMPHSABS 1.7  --   --   MONOABS 0.5  --   --   EOSABS 0.1  --   --   BASOSABS 0.0  --   --    < > = values in this interval not displayed.    HEMOGLOBIN A1C Lab Results  Component Value Date   HGBA1C 5.9 (H) 10/28/2020   MPG 122.63 10/28/2020    Cardiac Panel (last 3 results) No results for input(s): CKTOTAL, CKMB, TROPONINI, RELINDX in the last 8760 hours.  BNP (last 3 results) Recent Labs    10/28/20 1433  BNP 1,827.5*    Lipid Panel     Component Value Date/Time   CHOL 257 (H) 10/30/2020 0248   TRIG 63 10/30/2020 0248   HDL 40 (L) 10/30/2020 0248   CHOLHDL 6.4 10/30/2020 0248   VLDL 13 10/30/2020 0248   LDLCALC 204 (H) 10/30/2020 0248     Hepatic Function Panel Recent Labs    11/24/19 1102  PROT 6.9  ALBUMIN 4.3  AST 18  ALT 20  ALKPHOS 76  BILITOT 0.3    Imaging: Chest x-ray 10/28/2020:  Cardiomegaly should be Borderline mild congestive heart failure. Trace right pleural effusion.    Cardiac Studies:   Echocardiogram 10/29/2020: 1. Left ventricular ejection fraction, by estimation, is <20%. The left  ventricle has severely decreased function. The left ventricle demonstrates  global hypokinesis. The left ventricular internal cavity size was mildly  dilated. Left ventricular  diastolic parameters are consistent with Grade II diastolic dysfunction  (pseudonormalization). Small LV apical thrombus seen on  Definity contrast  study.  2. Right ventricular systolic function is mildly reduced. The right  ventricular size is normal. There is normal pulmonary artery systolic  pressure.  3. Left atrial size was mildly dilated.  4. Right atrial size was mildly dilated.  5. Mild thciekning of mitral valve leaflets. Moderate mitral valve  regurgitation.   Coronary angiography 10/29/2020: LM: Long vessel. Normal LAD: Prox 90% stenosis, followed by flush occlusion after Diag 1        No antegrade flow seen in LAD        Faint collaterals from RPL fill small caliber LAD        Diag 1 with prox tandem 95% stenoses LCx: Prox tandem 95% stenoses        Prox OM1 60% stenosis        Mid LCx 60% stenosis RCA: Prox 90% stenosis, followed by 100% occlusion         Bridging collaterals faintly fill mid RCA, and reconstitute distal RCA         Prox RPDA 70% stenosis, followed by 50 stenosis  PA: 39/14 mmHg, mean PAP 23 mmHg PCW: 6 mmHg  Compensated ischemic cardiomyopathy   EKG 10/28/2020: Sinus rhythm, LVH Inferolateral T wave inversion, consider ischemia  Assessment & Recommendations:  70 y.o. African Aemrican male  with h/o anemia 2018, possible seizure 11/2019, mixed hyperlipidemia, now with new diagnosis of ischemic cardiomyopathy, multivessel CAD  Multivessel CAD: Optimal revascularization will be surgical. LVEF 10% with questionable LV apical thrombus. Awaiting cardiac MRI to assess quality and evaluate for apical thrombus. Appreciate CVTS input.  Ischemic cardiomyopathy: Acute on chronic systolic heart failure: LVEF 10% with possible LV apical thrombus. Continue IV heparin. Tolerating Corlanor 5 mg twice daily with heart rate in 70s. Added losartan 12.5 mg daily today. Multiple beta-blocker at this time given no angiogram in hospital: CABG  in near future. Awaiting cardiac MRI today. Appreciate heart failure team input.  Mixed hyperlipidemia: LDL 204.  Continue Lipitor 80  mg.    Elder Negus, MD Pager: [phone] Office: [phone]

## 2020-10-30 NOTE — Progress Notes (Signed)
Pre-CABG study completed.  ° °Please see CV Proc for preliminary results.  ° °Shavon Zenz, RDMS, RVT ° °

## 2020-10-30 NOTE — Progress Notes (Signed)
PROGRESS NOTE    Troy Johnson  ZOX:[id number] DOB: Nov 10, 1950 DOA: 10/28/2020 PCP: [name], MD    Chief Complaint  Patient presents with  . Shortness of Breath    Brief Narrative:  70 year old male with medical history of hypertension presents with shortness of breath.  Patient states that last Friday he started having shortness of breath initially had exertional dyspnea then developed orthopnea for past 2 nights.  Initial troponin was 656, potassium 5.9, chest x-ray showed cardiomegaly and pulmonary vascular congestion.  EKG showed ST depression T wave inversion V5 V6.  Cardiology was consulted, patient received 1 dose of Lasix with improvement in symptoms.  Heparin drip was started in the ED.  Patient went for cardiac catheterization this morning which showed multivessel disease.  CVTS was consulted for CABG.   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  -   Acute decompensated CHF:  On IV lasix,.    Acute coronary syndrome:  - elevated troponin s secondary to demand ischemia.  - continue with IV heparin and aspirin.  -   Hyperlipidemia:  Continue with lipitor 40 mg daily.    DVT prophylaxis: HEPARIN.  Code Status: FULL CODE.  Family Communication: none at bedside.  Disposition:   Status is: Inpatient  Remains inpatient appropriate because:Ongoing diagnostic testing needed not appropriate for outpatient work up and IV treatments appropriate due to intensity of illness or inability to take PO   Dispo: The patient is from: Home              Anticipated d/c is to: Home              Patient currently is not medically stable to d/c.   Difficult to place patient No       Consultants:    CARDIOLOGY  CTVS   Procedures: scheduled for cardiac MRI.    Antimicrobials: none.    Subjective: No chest pain or sob. No nausea, vomiting.   Objective: Vitals:   10/30/20 0424 10/30/20 0531 10/30/20 0811 10/30/20 1119  BP: 115/83  103/72 102/76  Pulse: 89  87 84  Resp: Temp: 98.4 F (36.9 C)  97.7 F (36.5 C) 98.1 F (36.7 C)  TempSrc: Oral  Oral Oral  SpO2: 96%  98% 100%  Weight:  68.9 kg    Height:        Intake/Output Summary (Last 24 hours) at 10/30/2020 1705 Last data filed at 10/30/2020 0600 Gross per 24 hour  Intake 1035.17 ml  Output 200 ml  Net 835.17 ml   Filed Weights   10/28/20 1956 10/29/20 0413 10/30/20 0531  Weight: 70.9 kg 70.4 kg 68.9 kg    Examination:  General exam: Appears calm and comfortable  Respiratory system: Clear to auscultation. Respiratory effort normal. Cardiovascular system: S1 & S2 heard, RRR. Marland Kitchen No pedal edema. Gastrointestinal system: Abdomen is nondistended, soft and nontender. Normal bowel sounds heard. Central nervous system: Alert and oriented. No focal neurological deficits. Extremities: Symmetric 5 x 5 power. Skin: No rashes, lesions or ulcers Psychiatry:  Mood & affect appropriate.     Data Reviewed: I have personally reviewed following labs and imaging studies  CBC: Recent Labs  Lab 10/28/20 1433 10/29/20 0251  10/29/20 0806 10/29/20 0808 10/30/20 0248  WBC 4.7 5.1  --   --  4.6  NEUTROABS 2.3  --   --   --   --   HGB 13.1 12.8* 12.6* 12.9* 12.4*  HCT 39.0 37.2* 37.0* 38.0* 36.1*  MCV 97.0 94.7  --   --  95.3  PLT 272 289  --   --  275    Basic Metabolic Panel: Recent Labs  Lab 10/28/20 1433 10/28/20 1619 10/29/20 0251 10/29/20 0806 10/29/20 0808 10/30/20 0248  NA 136 139 137 139 140 138  K 5.9* 4.2 3.7 3.5 3.7 3.7  CL 106 108 104  --   --  105  CO2 21* 20* 23  --   --  23  GLUCOSE 106* 92 98  --   --  99  BUN 20 17 18   --   --  26*  CREATININE 1.23 1.21 1.31*  --   --  1.32*   CALCIUM 8.9 9.4 9.1  --   --  9.2    GFR: Estimated Creatinine Clearance: 51.5 mL/min (A) (by C-G formula based on SCr of 1.32 mg/dL (H)).  Liver Function Tests: No results for input(s): AST, ALT, ALKPHOS, BILITOT, PROT, ALBUMIN in the last 168 hours.  CBG: No results for input(s): GLUCAP in the last 168 hours.   Recent Results (from the past 240 hour(s))  Resp Panel by RT-PCR (Flu A&B, Covid) Nasopharyngeal Swab     Status: None   Collection Time: 10/28/20  2:50 PM   Specimen: Nasopharyngeal Swab; Nasopharyngeal(NP) swabs in vial transport medium  Result Value Ref Range Status   SARS Coronavirus 2 by RT PCR NEGATIVE NEGATIVE Final    Comment: (NOTE) SARS-CoV-2 target nucleic acids are NOT DETECTED.  The SARS-CoV-2 RNA is generally detectable in upper respiratory specimens during the acute phase of infection. The lowest concentration of SARS-CoV-2 viral copies this assay can detect is 138 copies/mL. A negative result does not preclude SARS-Cov-2 infection and should not be used as the sole basis for treatment or other patient management decisions. A negative result may occur with  improper specimen collection/handling, submission of specimen other than nasopharyngeal swab, presence of viral mutation(s) within the areas targeted by this assay, and inadequate number of viral copies(<138 copies/mL). A negative result must be combined with clinical observations, patient history, and epidemiological information. The expected result is Negative.  Fact Sheet for Patients:  BloggerCourse.comhttps://www.fda.gov/media/152166/download  Fact Sheet for Healthcare Providers:  SeriousBroker.ithttps://www.fda.gov/media/152162/download  This test is no t yet approved or cleared by the [place] FDA and  has been authorized for detection and/or diagnosis of SARS-CoV-2 by FDA under an Emergency Use Authorization (EUA). This EUA will remain  in effect (meaning this test can be used) for the duration of the COVID-19  declaration under Section 564(b)(1) of the Act, 21 U.S.C.section 360bbb-3(b)(1), unless the authorization is terminated  or revoked sooner.       Influenza A by PCR NEGATIVE NEGATIVE Final   Influenza B by PCR NEGATIVE NEGATIVE Final    Comment: (NOTE) The Xpert Xpress SARS-CoV-2/FLU/RSV plus assay is intended as an aid in the diagnosis of influenza from Nasopharyngeal swab specimens and should not be used as a sole basis for treatment. Nasal washings and aspirates are unacceptable for Xpert Xpress SARS-CoV-2/FLU/RSV testing.  Fact Sheet for Patients: BloggerCourse.comhttps://www.fda.gov/media/152166/download  Fact Sheet for Healthcare Providers: SeriousBroker.ithttps://www.fda.gov/media/152162/download  This test is not yet approved or cleared by the [place] FDA and has been authorized  for detection and/or diagnosis of SARS-CoV-2 by FDA under an Emergency Use Authorization (EUA). This EUA will remain in effect (meaning this test can be used) for the duration of the COVID-19 declaration under Section 564(b)(1) of the Act, 21 U.S.C. section 360bbb-3(b)(1), unless the authorization is terminated or revoked.  Performed at [facility], [address], [place], [place] [address]   Surgical pcr screen     Status: None   Collection Time: 10/29/20  5:19 PM   Specimen: Nasal Mucosa; Nasal Swab  Result Value Ref Range Status   MRSA, PCR NEGATIVE NEGATIVE Final   Staphylococcus aureus NEGATIVE NEGATIVE Final    Comment: (NOTE) The Xpert SA Assay (FDA approved for NASAL specimens in patients 63 years of age and older), is one component of a comprehensive surveillance program. It is not intended to diagnose infection nor to guide or monitor treatment. Performed at [facility], [address], [place], [place] [address]          Radiology Studies: CARDIAC CATHETERIZATION  Addendum Date: 10/30/2020   LM: Long vessel. Normal LAD: Prox 90% stenosis, followed by flush occlusion after Diag 1         No antegrade flow seen in LAD        Faint collaterals from RPL fill small caliber LAD        Diag 1 with prox tandem 95% stenoses LCx: Prox tandem 95% stenoses        Prox OM1 60% stenosis        Mid LCx 60% stenosis RCA: Prox 90% stenosis, followed by 100% occlusion         Bridging collaterals faintly fill mid RCA, and reconstitute distal RCA         Prox RPDA 70% stenosis, followed by 50 stenosis PA: 39/14 mmHg, mean PAP 23 mmHg PCW: 6 mmHg Compensated ischemic cardiomyopathy CVTS consultation for CABG Continue heparin Echocardiogram pending Elder Negus, MD Pager: [phone] Office: [phone]   Addendum Date: 10/30/2020   LM: Long vessel. Normal LAD: Prox 90% stenosis, followed by flush occlusion after Diag 1        No antegrade flow seen in LAD        Faint collaterals from RPL fill small caliber LAD        Diag 1 with prox tandem 95% stenoses LCx: Prox tandem 95% stenoses        Prox OM1 60% stenosis        Mid LCx 60% stenosis RCA: Prox 90% stenosis, followed by 100% occlusion         Bridging collaterals faintly fill mid RCA, and reconstitute distal RCA         Prox RPDA 70% stenosis, followed by 50 stenosis PA: 39/14 mmHg, mean PAP 23 mmHg PCW: 6 mmHg Compensated ischemic cardiomyopathy CVTS consultation for CABG Continue heparin Echocardiogram pending Elder Negus, MD Pager: [phone] Office: [phone]   Addendum Date: 10/30/2020   LM: Long vessel. Normal LAD: Prox 90% stenosis, followed by flush occlusion after Diag 1        No antegrade flow seen in LAD        Faint collaterals from RPL fill small caliber LAD        Diag 1 with prox tandem 95% stenoses LCx: Prox tandem 95% stenoses        Prox OM1 60% stenosis        Mid LCx 60% stenosis RCA: Prox 90% stenosis, followed by  100% occlusion         Bridging collaterals faintly fill mid RCA, and reconstitute distal RCA         Prox RPDA 70% stenosis, followed by 50 stenosis PA: 39/14 mmHg, mean PAP 23 mmHg PCW: 6 mmHg  Compensated ischemic cardiomyopathy CVTS consultation for CABG Continue heparin Echocardiogram pending Elder Negus, MD Pager: [phone] Office: [phone]   Result Date: 10/29/2020 LM: Long vessel. Normal LAD: Prox 90% stenosis, followed by flush occlusion after Diag 1        No antegrade flow seen in LAD        Faint collaterals from RPL fill small caliber LAD        Diag 1 with prox tandem 95% stenoses LCx: Prox tandem 95% stenoses        Prox OM1 60% stenosis        Mid LCx 60% stenosis RCA: Prox 90% stenosis, followed by 100% occlusion         Bridging collaterals faintly fill mid RCA, and reconstitute distal RCA         Prox RPDA 70% stenosis, followed by 50 stenosis CVTS consultation for CABG Continue heparin Echocardiogram pending Elder Negus, MD Pager: [phone] Office: [phone]   ECHOCARDIOGRAM COMPLETE  Result Date: 10/29/2020    ECHOCARDIOGRAM REPORT   Patient Name:   DEVONNE LALANI Date of Exam: 10/29/2020 Medical Rec #:  [id number]      Height:       73.0 in Accession #:    [id number]     Weight:       155.2 lb Date of Birth:  1950/09/03     BSA:          1.932 m Patient Age:    69 years       BP:           102/82 mmHg Patient Gender: M              HR:           94 bpm. Exam Location:  Inpatient Procedure: 2D Echo, Color Doppler, Cardiac Doppler and Intracardiac            Opacification Agent Indications:     CHF-Acute Systolic I50.21  History:         Patient has prior history of Echocardiogram examinations, most                  recent 12/05/2016.  Sonographer:     Neomia Dear RDCS Referring Phys:  [id number] Emeline General Diagnosing Phys: Truett Mainland MD IMPRESSIONS  1. Left ventricular ejection fraction, by estimation, is <20%. The left ventricle has severely decreased function. The left ventricle demonstrates global hypokinesis. The left ventricular internal cavity size was mildly dilated. Left ventricular diastolic parameters are consistent with Grade II diastolic  dysfunction (pseudonormalization). Small LV apical thrombus seen on Definity contrast study.  2. Right ventricular systolic function is mildly reduced. The right ventricular size is normal. There is normal pulmonary artery systolic pressure.  3. Left atrial size was mildly dilated.  4. Right atrial size was mildly dilated.  5. Mild thciekning of mitral valve leaflets. Moderate mitral valve regurgitation. FINDINGS  Left Ventricle: Small LV apical thrombus seen on Definity contrast study. Left ventricular ejection fraction, by estimation, is <20%. The left ventricle has severely decreased function. The left ventricle demonstrates global hypokinesis. Definity contrast agent was given IV to delineate the left ventricular endocardial borders. The left ventricular internal  cavity size was mildly dilated. There is no left ventricular hypertrophy. Left ventricular diastolic parameters are consistent with Grade II diastolic dysfunction (pseudonormalization). Right Ventricle: The right ventricular size is normal. No increase in right ventricular wall thickness. Right ventricular systolic function is mildly reduced. There is normal pulmonary artery systolic pressure. The tricuspid regurgitant velocity is 2.80 m/s, and with an assumed right atrial pressure of 3 mmHg, the estimated right ventricular systolic pressure is 34.4 mmHg. Left Atrium: Left atrial size was mildly dilated. Right Atrium: Right atrial size was mildly dilated. Pericardium: There is no evidence of pericardial effusion. Mitral Valve: The mitral valve is abnormal. There is mild thickening of the mitral valve leaflet(s). Moderate mitral valve regurgitation. Tricuspid Valve: The tricuspid valve is grossly normal. Tricuspid valve regurgitation is mild. Aortic Valve: The aortic valve is grossly normal. Aortic valve regurgitation is not visualized. Aortic valve mean gradient measures 2.0 mmHg. Aortic valve peak gradient measures 2.8 mmHg. Aortic valve area, by VTI  measures 2.16 cm. Pulmonic Valve: The pulmonic valve was grossly normal. Pulmonic valve regurgitation is not visualized. Aorta: The aortic root and ascending aorta are structurally normal, with no evidence of dilitation. Venous: The inferior vena cava was not well visualized. IAS/Shunts: No atrial level shunt detected by color flow Doppler.  LEFT VENTRICLE PLAX 2D LVIDd:         6.40 cm  Diastology LVIDs:         6.00 cm  LV e' medial:    4.46 cm/s LV PW:         1.20 cm  LV E/e' medial:  10.7 LV IVS:        1.10 cm  LV e' lateral:   5.25 cm/s LVOT diam:     2.10 cm  LV E/e' lateral: 9.1 LV SV:         32 LV SV Index:   17 LVOT Area:     3.46 cm  RIGHT VENTRICLE RV S prime:     11.10 cm/s TAPSE (M-mode): 1.2 cm LEFT ATRIUM             Index       RIGHT ATRIUM           Index LA Vol (A2C):   51.5 ml 26.66 ml/m RA Area:     19.20 cm LA Vol (A4C):   64.4 ml 33.34 ml/m RA Volume:   55.90 ml  28.94 ml/m LA Biplane Vol: 59.8 ml 30.96 ml/m  AORTIC VALVE                   PULMONIC VALVE AV Area (Vmax):    2.57 cm    PV Vmax:       0.58 m/s AV Area (Vmean):   2.54 cm    PV Vmean:      42.100 cm/s AV Area (VTI):     2.16 cm    PV VTI:        0.107 m AV Vmax:           84.10 cm/s  PV Peak grad:  1.3 mmHg AV Vmean:          67.100 cm/s PV Mean grad:  1.0 mmHg AV VTI:            0.150 m AV Peak Grad:      2.8 mmHg AV Mean Grad:      2.0 mmHg LVOT Vmax:         62.50 cm/s LVOT Vmean:  49.300 cm/s LVOT VTI:          0.094 m LVOT/AV VTI ratio: 0.62 MITRAL VALVE                 TRICUSPID VALVE MV Area (PHT): 4.15 cm      TR Peak grad:   31.4 mmHg MV Decel Time: 183 msec      TR Vmax:        280.00 cm/s MR Peak grad:    73.6 mmHg MR Mean grad:    43.0 mmHg   SHUNTS MR Vmax:         429.00 cm/s Systemic VTI:  0.09 m MR Vmean:        304.0 cm/s  Systemic Diam: 2.10 cm MR PISA:         0.57 cm MR PISA Eff ROA: 3 mm MR PISA Radius:  0.30 cm MV E velocity: 47.60 cm/s MV A velocity: 50.60 cm/s MV E/A ratio:  0.94 Manish  Patwardhan MD Electronically signed by Truett Mainland MD Signature Date/Time: 10/29/2020/4:55:34 PM    Final    VAS US DOPPLER PRE CABG  Result Date: 10/30/2020 PREOPERATIVE VASCULAR EVALUATION Patient Name:  PAULANTHONY GLEAVES  Date of Exam:   10/30/2020 Medical Rec #: [id number]       Accession #:    [id number] Date of Birth: 04/13/1951      Patient Gender: M Patient Age:   69Y Exam Location:  [facility] Procedure:      VAS US DOPPLER PRE CABG Referring Phys: [id number] HARRELL O LIGHTFOOT --------------------------------------------------------------------------------  Indications:      Pre-CABG. Risk Factors:     Past history of smoking, prior MI, coronary artery disease. Comparison Study: No prior studies. Performing Technologist: Ernestene Mention RVT, RDMS Supporting Technologist: Jean Rosenthal RDMS,RVT  Examination Guidelines: A complete evaluation includes B-mode imaging, spectral Doppler, color Doppler, and power Doppler as needed of all accessible portions of each vessel. Bilateral testing is considered an integral part of a complete examination. Limited examinations for reoccurring indications may be performed as noted.  Right Carotid Findings: +----------+--------+--------+--------+------------+------------------+           PSV cm/sEDV cm/sStenosisDescribe    Comments           +----------+--------+--------+--------+------------+------------------+ CCA Prox  86      24                          intimal thickening +----------+--------+--------+--------+------------+------------------+ CCA Distal85      21              heterogenous                   +----------+--------+--------+--------+------------+------------------+ ICA Prox  91      26      1-39%   heterogenous                   +----------+--------+--------+--------+------------+------------------+ ICA Distal83      36                                              +----------+--------+--------+--------+------------+------------------+ ECA       80      12                                             +----------+--------+--------+--------+------------+------------------+  Portions of this table do not appear on this page. +----------+--------+-------+----------------+------------+           PSV cm/sEDV cmsDescribe        Arm Pressure +----------+--------+-------+----------------+------------+ Subclavian96             Multiphasic, WNL             +----------+--------+-------+----------------+------------+ +---------+--------+--+--------+--+---------+ VertebralPSV cm/s51EDV cm/s18Antegrade +---------+--------+--+--------+--+---------+ Left Carotid Findings: +----------+--------+--------+--------+------------+--------+           PSV cm/sEDV cm/sStenosisDescribe    Comments +----------+--------+--------+--------+------------+--------+ CCA Prox  103     31                                   +----------+--------+--------+--------+------------+--------+ CCA Distal98      23              heterogenous         +----------+--------+--------+--------+------------+--------+ ICA Prox  87      31      1-39%   heterogenous         +----------+--------+--------+--------+------------+--------+ ICA Distal65      26                                   +----------+--------+--------+--------+------------+--------+ ECA       89                      heterogenous         +----------+--------+--------+--------+------------+--------+ +----------+--------+--------+----------------+------------+ SubclavianPSV cm/sEDV cm/sDescribe        Arm Pressure +----------+--------+--------+----------------+------------+           88              Multiphasic, WNL             +----------+--------+--------+----------------+------------+ +---------+--------+--+--------+--+---------+ VertebralPSV cm/s27EDV cm/s10Antegrade  +---------+--------+--+--------+--+---------+  ABI Findings: +--------+------------------+-----+---------+--------+ Right   Rt Pressure (mmHg)IndexWaveform Comment  +--------+------------------+-----+---------+--------+ [id number]                     triphasic         +--------+------------------+-----+---------+--------+ PTA     99                1.01 triphasic         +--------+------------------+-----+---------+--------+ DP      111               1.13 triphasic         +--------+------------------+-----+---------+--------+ +--------+------------------+-----+---------+-------+ Left    Lt Pressure (mmHg)IndexWaveform Comment +--------+------------------+-----+---------+-------+ [id number]                     triphasic        +--------+------------------+-----+---------+-------+ PTA     109               1.11 triphasic        +--------+------------------+-----+---------+-------+ DP      106               1.08 triphasic        +--------+------------------+-----+---------+-------+  Right Doppler Findings: +--------+--------+-----+---------+--------+ Site    PressureIndexDoppler  Comments +--------+--------+-----+---------+--------+ [id number]           triphasic         +--------+--------+-----+---------+--------+ Radial               triphasic         +--------+--------+-----+---------+--------+  Ulnar                triphasic         +--------+--------+-----+---------+--------+  Left Doppler Findings: +--------+--------+-----+---------+--------+ Site    PressureIndexDoppler  Comments +--------+--------+-----+---------+--------+ [id number]           triphasic         +--------+--------+-----+---------+--------+ Radial               triphasic         +--------+--------+-----+---------+--------+ Ulnar                triphasic         +--------+--------+-----+---------+--------+  Summary: Right Carotid: Velocities in the right  ICA are consistent with a 1-39% stenosis. Left Carotid: Velocities in the left ICA are consistent with a 1-39% stenosis. Vertebrals:  Bilateral vertebral arteries demonstrate antegrade flow. Subclavians: Normal flow hemodynamics were seen in bilateral subclavian              arteries. Right ABI: Resting right ankle-brachial index is within normal range. No evidence of significant right lower extremity arterial disease. Left ABI: Resting left ankle-brachial index is within normal range. No evidence of significant left lower extremity arterial disease. Right Upper Extremity: Doppler waveforms remain within normal limits with right radial compression. Doppler waveforms remain within normal limits with right ulnar compression. Left Upper Extremity: Doppler waveform obliterate with left radial compression. Doppler waveforms remain within normal limits with left ulnar compression.     Preliminary         Scheduled Meds: . aspirin EC  81 mg Oral Daily  . [START ON 10/31/2020] atorvastatin  80 mg Oral Daily  . digoxin  0.125 mg Oral Daily  . [START ON 10/31/2020] epinephrine  0-10 mcg/min Intravenous To OR  . [START ON 10/31/2020] heparin-papaverine-plasmalyte irrigation   Irrigation To OR  . [START ON 10/31/2020] insulin   Intravenous To OR  . ivabradine  5 mg Oral BID WC  . [place] Blood Cardioplegia vial (lidocaine/magnesium/mannitol 0.26g-4g-6.4g)   Intracoronary Once  . losartan  12.5 mg Oral Daily  . [START ON 10/31/2020] phenylephrine  30-200 mcg/min Intravenous To OR  . [START ON 10/31/2020] potassium chloride  80 mEq Other To OR  . sodium chloride flush  3 mL Intravenous Q12H  . sodium chloride flush  3 mL Intravenous Q12H  . [START ON 10/31/2020] tranexamic acid  15 mg/kg Intravenous To OR  . [START ON 10/31/2020] tranexamic acid  2 mg/kg Intracatheter To OR   Continuous Infusions: . sodium chloride    . sodium chloride    . [START ON 10/31/2020] dexmedetomidine    . [START ON 10/31/2020] heparin 30,000  units/NS 1000 mL solution for CELLSAVER    . heparin 1,400 Units/hr (10/30/20 1130)  . [START ON 10/31/2020] levofloxacin (LEVAQUIN) IV    . [START ON 10/31/2020] milrinone    . [START ON 10/31/2020] nitroGLYCERIN    . [START ON 10/31/2020] norepinephrine    . [START ON 10/31/2020] tranexamic acid (CYKLOKAPRON) infusion ([facility])    . [START ON 10/31/2020] vancomycin       LOS: 2 days        Kathlen Mody, MD [facility]   To contact the attending provider between 7A-7P or the covering provider during after hours 7P-7A, please log into the web site [email or web address] and access using universal [place] password for that web site. If you do not have the password, please call the hospital operator.  10/30/2020, 5:05 PM

## 2020-10-30 NOTE — Plan of Care (Signed)

## 2020-10-31 ENCOUNTER — Encounter (HOSPITAL_COMMUNITY)
Admission: EM | Disposition: A | Discharge status: Home / Self Care | Payer: Self-pay | Source: Ambulatory Visit | Attending: Thoracic Surgery (Cardiothoracic Vascular Surgery)

## 2020-10-31 DIAGNOSIS — I509 Heart failure, unspecified: Secondary | ICD-10-CM | POA: Diagnosis not present

## 2020-10-31 DIAGNOSIS — I5020 Unspecified systolic (congestive) heart failure: Secondary | ICD-10-CM | POA: Diagnosis not present

## 2020-10-31 DIAGNOSIS — R0600 Dyspnea, unspecified: Secondary | ICD-10-CM | POA: Diagnosis not present

## 2020-10-31 DIAGNOSIS — I5021 Acute systolic (congestive) heart failure: Secondary | ICD-10-CM | POA: Diagnosis not present

## 2020-10-31 DIAGNOSIS — I25110 Atherosclerotic heart disease of native coronary artery with unstable angina pectoris: Secondary | ICD-10-CM | POA: Diagnosis not present

## 2020-10-31 LAB — BASIC METABOLIC PANEL
Anion gap: 8 (ref 5–15)
BUN: 25 mg/dL — ABNORMAL HIGH (ref 8–23)
CO2: 24 mmol/L (ref 22–32)
Calcium: 9.2 mg/dL (ref 8.9–10.3)
Chloride: 106 mmol/L (ref 98–111)
Creatinine, Ser: 1.29 mg/dL — ABNORMAL HIGH (ref 0.61–1.24)
GFR, Estimated: >60 mL/min (ref >60)
Glucose, Bld: 92 mg/dL (ref 70–99)
Potassium: 4 mmol/L (ref 3.5–5.1)
Sodium: 138 mmol/L (ref 135–145)

## 2020-10-31 LAB — CBC
HCT: 35 % — ABNORMAL LOW (ref 39.0–52.0)
Hemoglobin: 12 g/dL — ABNORMAL LOW (ref 13.0–17.0)
MCH: 32.8 pg (ref 26.0–34.0)
MCHC: 34.3 g/dL (ref 30.0–36.0)
MCV: 95.6 fL (ref 80.0–100.0)
Platelets: 246 10*3/uL (ref 150–400)
RBC: 3.66 MIL/uL — ABNORMAL LOW (ref 4.22–5.81)
RDW: 15 % (ref 11.5–15.5)
WBC: 4.9 10*3/uL (ref 4.0–10.5)
nRBC: 0 % (ref 0.0–0.2)

## 2020-10-31 LAB — HEPARIN LEVEL (UNFRACTIONATED)
Heparin Unfractionated: 0.92 IU/mL — ABNORMAL HIGH (ref 0.30–0.70)
Heparin Unfractionated: 0.83 IU/mL — ABNORMAL HIGH (ref 0.30–0.70)

## 2020-10-31 SURGERY — CORONARY ARTERY BYPASS GRAFTING (CABG)
Anesthesia: General | Site: Chest

## 2020-10-31 MED ORDER — SPIRONOLACTONE 12.5 MG HALF TABLET
12.5000 mg | ORAL_TABLET | Freq: Every day | ORAL | Status: DC
  Administered 2020-10-31 – 2020-11-04 (×5): 12.5 mg via ORAL
  Filled 2020-10-31 (×5): qty 1

## 2020-10-31 NOTE — Progress Notes (Signed)
PROGRESS NOTE    Ebrahim Deremer  ZOX:[id number] DOB: September 25, 1950 DOA: 10/28/2020 PCP: [name], MD    Chief Complaint  Patient presents with  . Shortness of Breath    Brief Narrative:  70 year old male with medical history of hypertension presents with shortness of breath.  Patient states that last Friday he started having shortness of breath initially had exertional dyspnea then developed orthopnea for past 2 nights.  Initial troponin was 656, potassium 5.9, chest x-ray showed cardiomegaly and pulmonary vascular congestion.  EKG showed ST depression T wave inversion V5 V6.  Cardiology was consulted, patient received 1 dose of Lasix with improvement in symptoms.  Heparin drip was started in the ED.  Patient went for cardiac catheterization which showed multivessel disease.  cardiothoracic surgery was consulted for CABG. Cardiac MRI ordered for evaluation of viability. PCCM consulted for assistance in pre op evaluation and care as pt will be high risk.    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  - PCCM consulted for assistance in pre op evaluation and care.   Acute decompensated CHF:  He appears compensated at this time. He denies any sob or chest pain.  Continue with digoxin, cozaar and spironolactone added to his regimen.  Continue with strict intake and output.    Acute coronary syndrome:  - elevated troponin s secondary to demand ischemia.  - continue with IV heparin and aspirin.  - pt currently denies any chest pain.    Hyperlipidemia:  Continue with lipitor 80 mg daily.      DVT prophylaxis: HEPARIN.  Code Status: FULL CODE.  Family Communication: none at bedside.  Disposition:   Status is:  Inpatient  Remains inpatient appropriate because:Ongoing diagnostic testing needed not appropriate for outpatient work up and IV treatments appropriate due to intensity of illness or inability to take PO   Dispo: The patient is from: Home              Anticipated d/c is to: Home              Patient currently is not medically stable to d/c.   Difficult to place patient No       Consultants:   CARDIOLOGY  Cardiothoracic surgery.   PCCM.    Procedures: cardiac MRI     Antimicrobials: none.    Subjective: No chest pain or sob.    Objective: Vitals:   10/30/20 1119 10/30/20 2028 10/31/20 0507 10/31/20 0512  BP: 102/76 97/71 101/73   Pulse: 84 78    Resp: Temp: 98.1 F (36.7 C) 98.3 F (36.8 C) 97.7 F (36.5 C)   TempSrc: Oral Oral Oral   SpO2: 100% 99% 99%   Weight:    68.5 kg  Height:       No intake or output data in the 24 hours ending 10/31/20 1201 Filed Weights   10/29/20 0413 10/30/20 0531 10/31/20 0512  Weight: 70.4 kg 68.9 kg 68.5 kg    Examination:  General exam: Alert and comfortable not in any kind of distress Respiratory system: Clear to auscultation bilaterally, no wheezing or rhonchi Cardiovascular system: S1-S2 heard, regular rate rhythm, no JVD no pedal edema Gastrointestinal system: Abdomen is soft, nontender bowel  sounds normal  Central nervous system: Alert and oriented, grossly nonfocal Extremities: No pedal edema Skin: No rashes seen  psychiatry: Mood is appropriate.     Data Reviewed: I have personally reviewed following labs and imaging studies  CBC: Recent Labs  Lab 10/28/20 1433 10/29/20 0251 10/29/20 0806 10/29/20 0808 10/30/20 0248 10/31/20 0429  WBC 4.7 5.1  --   --  4.6 4.9  NEUTROABS 2.3  --   --   --   --   --   HGB 13.1 12.8* 12.6* 12.9* 12.4* 12.0*  HCT 39.0 37.2* 37.0* 38.0* 36.1* 35.0*  MCV 97.0 94.7  --   --  95.3 95.6  PLT 272 289  --   --  275 246    Basic Metabolic Panel: Recent Labs   Lab 10/28/20 1433 10/28/20 1619 10/29/20 0251 10/29/20 0806 10/29/20 0808 10/30/20 0248 10/31/20 0429  NA 136 139 137 139 140 138 138  K 5.9* 4.2 3.7 3.5 3.7 3.7 4.0  CL 106 108 104  --   --  105 106  CO2 21* 20* 23  --   --  23 24  GLUCOSE 106* 92 98  --   --  99 92  BUN 20 17 18   --   --  26* 25*  CREATININE 1.23 1.21 1.31*  --   --  1.32* 1.29*  CALCIUM 8.9 9.4 9.1  --   --  9.2 9.2    GFR: Estimated Creatinine Clearance: 52.4 mL/min (A) (by C-G formula based on SCr of 1.29 mg/dL (H)).  Liver Function Tests: No results for input(s): AST, ALT, ALKPHOS, BILITOT, PROT, ALBUMIN in the last 168 hours.  CBG: No results for input(s): GLUCAP in the last 168 hours.   Recent Results (from the past 240 hour(s))  Resp Panel by RT-PCR (Flu A&B, Covid) Nasopharyngeal Swab     Status: None   Collection Time: 10/28/20  2:50 PM   Specimen: Nasopharyngeal Swab; Nasopharyngeal(NP) swabs in vial transport medium  Result Value Ref Range Status   SARS Coronavirus 2 by RT PCR NEGATIVE NEGATIVE Final    Comment: (NOTE) SARS-CoV-2 target nucleic acids are NOT DETECTED.  The SARS-CoV-2 RNA is generally detectable in upper respiratory specimens during the acute phase of infection. The lowest concentration of SARS-CoV-2 viral copies this assay can detect is 138 copies/mL. A negative result does not preclude SARS-Cov-2 infection and should not be used as the sole basis for treatment or other patient management decisions. A negative result may occur with  improper specimen collection/handling, submission of specimen other than nasopharyngeal swab, presence of viral mutation(s) within the areas targeted by this assay, and inadequate number of viral copies(<138 copies/mL). A negative result must be combined with clinical observations, patient history, and epidemiological information. The expected result is Negative.  Fact Sheet for Patients:  12/28/20  Fact  Sheet for Healthcare Providers:  BloggerCourse.com  This test is no t yet approved or cleared by the [employer] FDA and  has been authorized for detection and/or diagnosis of SARS-CoV-2 by FDA under an Emergency Use Authorization (EUA). This EUA will remain  in effect (meaning this test can be used) for the duration of the COVID-19 declaration under Section 564(b)(1) of the Act, 21 U.S.C.section 360bbb-3(b)(1), unless the authorization is terminated  or revoked sooner.       Influenza A by PCR NEGATIVE NEGATIVE Final   Influenza B by PCR NEGATIVE NEGATIVE Final    Comment: (NOTE) The Xpert Xpress SARS-CoV-2/FLU/RSV  plus assay is intended as an aid in the diagnosis of influenza from Nasopharyngeal swab specimens and should not be used as a sole basis for treatment. Nasal washings and aspirates are unacceptable for Xpert Xpress SARS-CoV-2/FLU/RSV testing.  Fact Sheet for Patients: BloggerCourse.com  Fact Sheet for Healthcare Providers: SeriousBroker.it  This test is not yet approved or cleared by the [place] FDA and has been authorized for detection and/or diagnosis of SARS-CoV-2 by FDA under an Emergency Use Authorization (EUA). This EUA will remain in effect (meaning this test can be used) for the duration of the COVID-19 declaration under Section 564(b)(1) of the Act, 21 U.S.C. section 360bbb-3(b)(1), unless the authorization is terminated or revoked.  Performed at [facility], [address] [place], [place] [address]   Surgical pcr screen     Status: None   Collection Time: 10/29/20  5:19 PM   Specimen: Nasal Mucosa; Nasal Swab  Result Value Ref Range Status   MRSA, PCR NEGATIVE NEGATIVE Final   Staphylococcus aureus NEGATIVE NEGATIVE Final    Comment: (NOTE) The Xpert SA Assay (FDA approved for NASAL specimens in patients 60 years of age and older), is one component of a  comprehensive surveillance program. It is not intended to diagnose infection nor to guide or monitor treatment. Performed at [facility], [address], [place], [place] [address]          Radiology Studies: MR CARDIAC MORPHOLOGY W WO CONTRAST  Result Date: 10/30/2020 CLINICAL DATA:  Ischemic cardiomyopathy, viability EXAM: CARDIAC MRI TECHNIQUE: The patient was scanned on a 1.5 Tesla GE magnet. A dedicated cardiac coil was used. Functional imaging was done using Fiesta sequences. 2,3, and 4 chamber views were done to assess for RWMA's. Modified Simpson's rule using a short axis stack was used to calculate an ejection fraction on a dedicated work [employer]. The patient received 8 cc of Gadavist. After 10 minutes inversion recovery sequences were used to assess for infiltration and scar tissue. FINDINGS: There is a small right pleural effusion. Atelectasis right lung base. Trivial pericardial effusion. The left ventricle was severely dilated. Thin inferolateral and apex. Global severe hypokinesis with apical, inferior, and inferolateral akinesis. LV EF calculated as 14%. There was a small thrombus at the LV apex. Normal right ventricular size with mildly decreased systolic function, EF 36%. Moderate left atrial enlargement. Normal right atrial size. Trileaflet aortic valve with no significant stenosis or regurgitation. Mitral regurgitation appears moderate visually. Delayed enhancement imaging: Small area of full thickness late gadolinium enhancement (LGE) mid inferolateral wall, but most of inferolateral wall has no significant LGE. 76-99% wall thickness subendocardial LGE in the apical septal wall and at the true apex. Measurements: LVEDV 366 mL LVSV 50 mL LVEF 14% RVEDV 157 mL RVSV 56 mL RVEF 36% IMPRESSION: 1. Severe LV enlargement with severe global hypokinesis with regional variation. EF 14%. 2.  Small apical LV thrombus. 3.  Normal RV size with mildly decreased  systolic function, EF 36%. 4.  Moderate mitral regurgitation. 5. Based on severe LV dysfunction, there was less late gadolinium enhancement than I would have expected. With the exception of the apical septal wall and the true apex, would expect the remainder of the LV myocardium to be viable. Dalton Mclean Electronically Signed   By: Marca Ancona M.D.   On: 10/30/2020 20:23   ECHOCARDIOGRAM COMPLETE  Result Date: 10/29/2020    ECHOCARDIOGRAM REPORT   Patient Name:   TUNIS GENTLE Date of  Exam: 10/29/2020 Medical Rec #:  [id number]      Height:       73.0 in Accession #:    [id number]     Weight:       155.2 lb Date of Birth:  07-May-1951     BSA:          1.932 m Patient Age:    69 years       BP:           102/82 mmHg Patient Gender: M              HR:           94 bpm. Exam Location:  Inpatient Procedure: 2D Echo, Color Doppler, Cardiac Doppler and Intracardiac            Opacification Agent Indications:     CHF-Acute Systolic I50.21  History:         Patient has prior history of Echocardiogram examinations, most                  recent 12/05/2016.  Sonographer:     Neomia Dear RDCS Referring Phys:  [id number] Emeline General Diagnosing Phys: Truett Mainland MD IMPRESSIONS  1. Left ventricular ejection fraction, by estimation, is <20%. The left ventricle has severely decreased function. The left ventricle demonstrates global hypokinesis. The left ventricular internal cavity size was mildly dilated. Left ventricular diastolic parameters are consistent with Grade II diastolic dysfunction (pseudonormalization). Small LV apical thrombus seen on Definity contrast study.  2. Right ventricular systolic function is mildly reduced. The right ventricular size is normal. There is normal pulmonary artery systolic pressure.  3. Left atrial size was mildly dilated.  4. Right atrial size was mildly dilated.  5. Mild thciekning of mitral valve leaflets. Moderate mitral valve regurgitation. FINDINGS  Left Ventricle: Small LV apical  thrombus seen on Definity contrast study. Left ventricular ejection fraction, by estimation, is <20%. The left ventricle has severely decreased function. The left ventricle demonstrates global hypokinesis. Definity contrast agent was given IV to delineate the left ventricular endocardial borders. The left ventricular internal cavity size was mildly dilated. There is no left ventricular hypertrophy. Left ventricular diastolic parameters are consistent with Grade II diastolic dysfunction (pseudonormalization). Right Ventricle: The right ventricular size is normal. No increase in right ventricular wall thickness. Right ventricular systolic function is mildly reduced. There is normal pulmonary artery systolic pressure. The tricuspid regurgitant velocity is 2.80 m/s, and with an assumed right atrial pressure of 3 mmHg, the estimated right ventricular systolic pressure is 34.4 mmHg. Left Atrium: Left atrial size was mildly dilated. Right Atrium: Right atrial size was mildly dilated. Pericardium: There is no evidence of pericardial effusion. Mitral Valve: The mitral valve is abnormal. There is mild thickening of the mitral valve leaflet(s). Moderate mitral valve regurgitation. Tricuspid Valve: The tricuspid valve is grossly normal. Tricuspid valve regurgitation is mild. Aortic Valve: The aortic valve is grossly normal. Aortic valve regurgitation is not visualized. Aortic valve mean gradient measures 2.0 mmHg. Aortic valve peak gradient measures 2.8 mmHg. Aortic valve area, by VTI measures 2.16 cm. Pulmonic Valve: The pulmonic valve was grossly normal. Pulmonic valve regurgitation is not visualized. Aorta: The aortic root and ascending aorta are structurally normal, with no evidence of dilitation. Venous: The inferior vena cava was not well visualized. IAS/Shunts: No atrial level shunt detected by color flow Doppler.  LEFT VENTRICLE PLAX 2D LVIDd:         6.40 cm  Diastology LVIDs:         6.00 cm  LV e' medial:    4.46  cm/s LV PW:         1.20 cm  LV E/e' medial:  10.7 LV IVS:        1.10 cm  LV e' lateral:   5.25 cm/s LVOT diam:     2.10 cm  LV E/e' lateral: 9.1 LV SV:         32 LV SV Index:   17 LVOT Area:     3.46 cm  RIGHT VENTRICLE RV S prime:     11.10 cm/s TAPSE (M-mode): 1.2 cm LEFT ATRIUM             Index       RIGHT ATRIUM           Index LA Vol (A2C):   51.5 ml 26.66 ml/m RA Area:     19.20 cm LA Vol (A4C):   64.4 ml 33.34 ml/m RA Volume:   55.90 ml  28.94 ml/m LA Biplane Vol: 59.8 ml 30.96 ml/m  AORTIC VALVE                   PULMONIC VALVE AV Area (Vmax):    2.57 cm    PV Vmax:       0.58 m/s AV Area (Vmean):   2.54 cm    PV Vmean:      42.100 cm/s AV Area (VTI):     2.16 cm    PV VTI:        0.107 m AV Vmax:           84.10 cm/s  PV Peak grad:  1.3 mmHg AV Vmean:          67.100 cm/s PV Mean grad:  1.0 mmHg AV VTI:            0.150 m AV Peak Grad:      2.8 mmHg AV Mean Grad:      2.0 mmHg LVOT Vmax:         62.50 cm/s LVOT Vmean:        49.300 cm/s LVOT VTI:          0.094 m LVOT/AV VTI ratio: 0.62 MITRAL VALVE                 TRICUSPID VALVE MV Area (PHT): 4.15 cm      TR Peak grad:   31.4 mmHg MV Decel Time: 183 msec      TR Vmax:        280.00 cm/s MR Peak grad:    73.6 mmHg MR Mean grad:    43.0 mmHg   SHUNTS MR Vmax:         429.00 cm/s Systemic VTI:  0.09 m MR Vmean:        304.0 cm/s  Systemic Diam: 2.10 cm MR PISA:         0.57 cm MR PISA Eff ROA: 3 mm MR PISA Radius:  0.30 cm MV E velocity: 47.60 cm/s MV A velocity: 50.60 cm/s MV E/A ratio:  0.94 Manish Patwardhan MD Electronically signed by Truett Mainland MD Signature Date/Time: 10/29/2020/4:55:34 PM    Final    VAS US DOPPLER PRE CABG  Result Date: 10/30/2020 PREOPERATIVE VASCULAR EVALUATION Patient Name:  BOWYN MERCIER  Date of Exam:   10/30/2020 Medical Rec #: [id number]       Accession #:    [id number] Date  of Birth: Jul 27, 1950      Patient Gender: M Patient Age:   69Y Exam Location:  [facility] Procedure:      VAS US DOPPLER  PRE CABG Referring Phys: [id number] HARRELL O LIGHTFOOT --------------------------------------------------------------------------------  Indications:      Pre-CABG. Risk Factors:     Past history of smoking, prior MI, coronary artery disease. Comparison Study: No prior studies. Performing Technologist: Ernestene Mention RVT, RDMS Supporting Technologist: Jean Rosenthal RDMS,RVT  Examination Guidelines: A complete evaluation includes B-mode imaging, spectral Doppler, color Doppler, and power Doppler as needed of all accessible portions of each vessel. Bilateral testing is considered an integral part of a complete examination. Limited examinations for reoccurring indications may be performed as noted.  Right Carotid Findings: +----------+--------+--------+--------+------------+------------------+           PSV cm/sEDV cm/sStenosisDescribe    Comments           +----------+--------+--------+--------+------------+------------------+ CCA Prox  86      24                          intimal thickening +----------+--------+--------+--------+------------+------------------+ CCA Distal85      21              heterogenous                   +----------+--------+--------+--------+------------+------------------+ ICA Prox  91      26      1-39%   heterogenous                   +----------+--------+--------+--------+------------+------------------+ ICA Distal83      36                                             +----------+--------+--------+--------+------------+------------------+ ECA       80      12                                             +----------+--------+--------+--------+------------+------------------+ Portions of this table do not appear on this page. +----------+--------+-------+----------------+------------+           PSV cm/sEDV cmsDescribe        Arm Pressure +----------+--------+-------+----------------+------------+ Subclavian96             Multiphasic, WNL              +----------+--------+-------+----------------+------------+ +---------+--------+--+--------+--+---------+ VertebralPSV cm/s51EDV cm/s18Antegrade +---------+--------+--+--------+--+---------+ Left Carotid Findings: +----------+--------+--------+--------+------------+--------+           PSV cm/sEDV cm/sStenosisDescribe    Comments +----------+--------+--------+--------+------------+--------+ CCA Prox  103     31                                   +----------+--------+--------+--------+------------+--------+ CCA Distal98      23              heterogenous         +----------+--------+--------+--------+------------+--------+ ICA Prox  87      31      1-39%   heterogenous         +----------+--------+--------+--------+------------+--------+ ICA Distal65      26                                   +----------+--------+--------+--------+------------+--------+  ECA       89                      heterogenous         +----------+--------+--------+--------+------------+--------+ +----------+--------+--------+----------------+------------+ SubclavianPSV cm/sEDV cm/sDescribe        Arm Pressure +----------+--------+--------+----------------+------------+           88              Multiphasic, WNL             +----------+--------+--------+----------------+------------+ +---------+--------+--+--------+--+---------+ VertebralPSV cm/s27EDV cm/s10Antegrade +---------+--------+--+--------+--+---------+  ABI Findings: +--------+------------------+-----+---------+--------+ Right   Rt Pressure (mmHg)IndexWaveform Comment  +--------+------------------+-----+---------+--------+ [id number]                     triphasic         +--------+------------------+-----+---------+--------+ PTA     99                1.01 triphasic         +--------+------------------+-----+---------+--------+ DP      111               1.13 triphasic          +--------+------------------+-----+---------+--------+ +--------+------------------+-----+---------+-------+ Left    Lt Pressure (mmHg)IndexWaveform Comment +--------+------------------+-----+---------+-------+ [id number]                     triphasic        +--------+------------------+-----+---------+-------+ PTA     109               1.11 triphasic        +--------+------------------+-----+---------+-------+ DP      106               1.08 triphasic        +--------+------------------+-----+---------+-------+  Right Doppler Findings: +--------+--------+-----+---------+--------+ Site    PressureIndexDoppler  Comments +--------+--------+-----+---------+--------+ [id number]           triphasic         +--------+--------+-----+---------+--------+ Radial               triphasic         +--------+--------+-----+---------+--------+ Ulnar                triphasic         +--------+--------+-----+---------+--------+  Left Doppler Findings: +--------+--------+-----+---------+--------+ Site    PressureIndexDoppler  Comments +--------+--------+-----+---------+--------+ [id number]           triphasic         +--------+--------+-----+---------+--------+ Radial               triphasic         +--------+--------+-----+---------+--------+ Ulnar                triphasic         +--------+--------+-----+---------+--------+  Summary: Right Carotid: Velocities in the right ICA are consistent with a 1-39% stenosis. Left Carotid: Velocities in the left ICA are consistent with a 1-39% stenosis. Vertebrals:  Bilateral vertebral arteries demonstrate antegrade flow. Subclavians: Normal flow hemodynamics were seen in bilateral subclavian              arteries. Right ABI: Resting right ankle-brachial index is within normal range. No evidence of significant right lower extremity arterial disease. Left ABI: Resting left ankle-brachial index is within normal range. No  evidence of significant left lower extremity arterial disease. Right Upper Extremity: Doppler waveforms remain within normal limits with right radial compression. Doppler waveforms remain within normal limits with right ulnar compression. Left Upper  Extremity: Doppler waveform obliterate with left radial compression. Doppler waveforms remain within normal limits with left ulnar compression.  Electronically signed by Heath Lark on 10/30/2020 at 5:06:47 PM.    Final         Scheduled Meds: . aspirin EC  81 mg Oral Daily  . atorvastatin  80 mg Oral Daily  . digoxin  0.125 mg Oral Daily  . epinephrine  0-10 mcg/min Intravenous To OR  . heparin-papaverine-plasmalyte irrigation   Irrigation To OR  . insulin   Intravenous To OR  . ivabradine  5 mg Oral BID WC  . [place] Blood Cardioplegia vial (lidocaine/magnesium/mannitol 0.26g-4g-6.4g)   Intracoronary Once  . losartan  12.5 mg Oral Daily  . phenylephrine  30-200 mcg/min Intravenous To OR  . potassium chloride  80 mEq Other To OR  . sodium chloride flush  3 mL Intravenous Q12H  . sodium chloride flush  3 mL Intravenous Q12H  . spironolactone  12.5 mg Oral Daily  . tranexamic acid  15 mg/kg Intravenous To OR  . tranexamic acid  2 mg/kg Intracatheter To OR   Continuous Infusions: . sodium chloride    . sodium chloride    . dexmedetomidine    . heparin 30,000 units/NS 1000 mL solution for CELLSAVER    . heparin 1,300 Units/hr (10/31/20 0959)  . levofloxacin (LEVAQUIN) IV    . milrinone    . nitroGLYCERIN    . norepinephrine    . tranexamic acid (CYKLOKAPRON) infusion ([facility])    . vancomycin       LOS: 3 days        Kathlen Mody, MD [facility]   To contact the attending provider between 7A-7P or the covering provider during after hours 7P-7A, please log into the web site [email or web address] and access using universal [place] password for that web site. If you do not have the password, please call the hospital  operator.  10/31/2020, 12:01 PM

## 2020-10-31 NOTE — Progress Notes (Signed)
Subjective:  No chest pain or shortness of breath Awaiting further work-up and management for multivessel CAD Reviewed cardiac MRI  Objective:  Vital Signs in the last 24 hours: Temp:  [97.7 F (36.5 C)-98.3 F (36.8 C)] 97.7 F (36.5 C) (05/05 0507) Pulse Rate:  [78-84] 78 (05/04 2028) Resp:  [18-20] 18 (05/05 0507) BP: (97-102)/(71-76) 101/73 (05/05 0507) SpO2:  [99 %-100 %] 99 % (05/05 0507) Weight:  [68.5 kg] 68.5 kg (05/05 0512)  Intake/Output from previous day: No intake/output data recorded.  Physical Exam Vitals and nursing note reviewed.  Constitutional:      General: He is not in acute distress.    Appearance: He is well-developed.  HENT:     Head: Normocephalic and atraumatic.  Eyes:     Conjunctiva/sclera: Conjunctivae normal.     Pupils: Pupils are equal, round, and reactive to light.  Neck:     Vascular: No JVD.  Cardiovascular:     Rate and Rhythm: Normal rate and regular rhythm.     Pulses: Normal pulses and intact distal pulses.     Heart sounds: No murmur heard.   Pulmonary:     Effort: Pulmonary effort is normal.     Breath sounds: Normal breath sounds. No wheezing or rales.  Abdominal:     General: Bowel sounds are normal.     Palpations: Abdomen is soft.     Tenderness: There is no rebound.  Musculoskeletal:        General: No tenderness. Normal range of motion.     Left lower leg: No edema.  Lymphadenopathy:     Cervical: No cervical adenopathy.  Skin:    General: Skin is warm and dry.  Neurological:     Mental Status: He is alert and oriented to person, place, and time.     Cranial Nerves: No cranial nerve deficit.      Lab Results: BMP Recent Labs    11/24/19 1102 10/28/20 1433 10/29/20 0251 10/29/20 0806 10/29/20 0808 10/30/20 0248 10/31/20 0429  NA 143   < > 137   < > 140 138 138  K 5.3*   < > 3.7   < > 3.7 3.7 4.0  CL 102   < > 104  --   --  105 106  CO2 26   < > 23  --   --  23 24  GLUCOSE 87   < > 98  --   --  99  92  BUN 16   < > 18  --   --  26* 25*  CREATININE 1.12   < > 1.31*  --   --  1.32* 1.29*  CALCIUM 10.4*   < > 9.1  --   --  9.2 9.2  GFRNONAA 67   < > 59*  --   --  58* >60  GFRAA 78  --   --   --   --   --   --    < > = values in this interval not displayed.    CBC Recent Labs  Lab 10/28/20 1433 10/29/20 0251 10/31/20 0429  WBC 4.7   < > 4.9  RBC 4.02*   < > 3.66*  HGB 13.1   < > 12.0*  HCT 39.0   < > 35.0*  PLT 272   < > 246  MCV 97.0   < > 95.6  MCH 32.6   < > 32.8  MCHC 33.6   < > 34.3  RDW 15.1   < > 15.0  LYMPHSABS 1.7  --   --   MONOABS 0.5  --   --   EOSABS 0.1  --   --   BASOSABS 0.0  --   --    < > = values in this interval not displayed.    HEMOGLOBIN A1C Lab Results  Component Value Date   HGBA1C 5.9 (H) 10/28/2020   MPG 122.63 10/28/2020    Cardiac Panel (last 3 results) No results for input(s): CKTOTAL, CKMB, TROPONINI, RELINDX in the last 8760 hours.  BNP (last 3 results) Recent Labs    10/28/20 1433  BNP 1,827.5*    Lipid Panel     Component Value Date/Time   CHOL 257 (H) 10/30/2020 0248   TRIG 63 10/30/2020 0248   HDL 40 (L) 10/30/2020 0248   CHOLHDL 6.4 10/30/2020 0248   VLDL 13 10/30/2020 0248   LDLCALC 204 (H) 10/30/2020 0248     Hepatic Function Panel Recent Labs    11/24/19 1102  PROT 6.9  ALBUMIN 4.3  AST 18  ALT 20  ALKPHOS 76  BILITOT 0.3    Imaging: Chest x-ray 10/28/2020:  Cardiomegaly should be Borderline mild congestive heart failure. Trace right pleural effusion.    Cardiac Studies:  Cardiac MRI 10/30/2020: 1. Severe LV enlargement with severe global hypokinesis with regional variation. EF 14%. 2.  Small apical LV thrombus. 3.  Normal RV size with mildly decreased systolic function, EF 36%. 4.  Moderate mitral regurgitation. 5. Based on severe LV dysfunction, there was less late gadolinium enhancement than I would have expected. With the exception of the apical septal wall and the true apex, would expect  the remainder of the LV myocardium to be viable.   Echocardiogram 10/29/2020: 1. Left ventricular ejection fraction, by estimation, is <20%. The left  ventricle has severely decreased function. The left ventricle demonstrates  global hypokinesis. The left ventricular internal cavity size was mildly  dilated. Left ventricular  diastolic parameters are consistent with Grade II diastolic dysfunction  (pseudonormalization). Small LV apical thrombus seen on Definity contrast  study.  2. Right ventricular systolic function is mildly reduced. The right  ventricular size is normal. There is normal pulmonary artery systolic  pressure.  3. Left atrial size was mildly dilated.  4. Right atrial size was mildly dilated.  5. Mild thciekning of mitral valve leaflets. Moderate mitral valve  regurgitation.   Coronary angiography 10/29/2020: LM: Long vessel. Normal LAD: Prox 90% stenosis, followed by flush occlusion after Diag 1        No antegrade flow seen in LAD        Faint collaterals from RPL fill small caliber LAD        Diag 1 with prox tandem 95% stenoses LCx: Prox tandem 95% stenoses        Prox OM1 60% stenosis        Mid LCx 60% stenosis RCA: Prox 90% stenosis, followed by 100% occlusion         Bridging collaterals faintly fill mid RCA, and reconstitute distal RCA         Prox RPDA 70% stenosis, followed by 50 stenosis  PA: 39/14 mmHg, mean PAP 23 mmHg PCW: 6 mmHg  Compensated ischemic cardiomyopathy   EKG 10/28/2020: Sinus rhythm, LVH Inferolateral T wave inversion, consider ischemia  Assessment & Recommendations:  70 y.o. African Aemrican male  with h/o anemia 2018, possible seizure 11/2019, mixed hyperlipidemia, now with new diagnosis of  ischemic cardiomyopathy, multivessel CAD  Multivessel CAD: Optimal revascularization will be surgical. LVEF 10% with questionable LV apical thrombus. Cardiac MRI confirms EF 14% and LV apical thrombus Except for apical septal wall and  the true apex, rest of the myocardium likely viable. Appreciate CVTS input. Awaiting CABG.   Ischemic cardiomyopathy: Acute on chronic systolic heart failure: LVEF 10% with possible LV apical thrombus. Continue IV heparin Continue Corlanor 5 mg twice daily, losartan 12.5 mg daily, spironolactone 12.5 mg daily. Holding beta-blocker at this time. Appreciate heart failure team input.  Mixed hyperlipidemia: LDL 204.  Continue Lipitor 80 mg.    Elder Negus, MD Pager: [phone] Office: [phone]

## 2020-10-31 NOTE — Progress Notes (Signed)
Patient ID: Troy Johnson, male   DOB: [birth year], 70 y.o.   MRN: [id number]     Advanced Heart Failure Rounding Note  PCP-Cardiologist: None   Subjective:    No complaints this morning, no chest pain or dyspnea.   Cardiac MRI:  1. Severe LV enlargement with severe global hypokinesis with regional variation. EF 14%. 2.  Small apical LV thrombus. 3.  Normal RV size with mildly decreased systolic function, EF 36%. 4.  Moderate mitral regurgitation. 5. Based on severe LV dysfunction, there was less late gadolinium enhancement than I would have expected. With the exception of the apical septal wall and the true apex, would expect the remainder of the LV myocardium to be viable.   Objective:   Weight Range: 68.5 kg Body mass index is 19.94 kg/m.   Vital Signs:   Temp:  [97.7 F (36.5 C)-98.3 F (36.8 C)] 97.7 F (36.5 C) (05/05 0507) Pulse Rate:  [78-84] 78 (05/04 2028) Resp:  [18-20] 18 (05/05 0507) BP: (97-102)/(71-76) 101/73 (05/05 0507) SpO2:  [99 %-100 %] 99 % (05/05 0507) Weight:  [68.5 kg] 68.5 kg (05/05 0512) Last BM Date: 10/29/20  Weight change: Filed Weights   10/29/20 0413 10/30/20 0531 10/31/20 0512  Weight: 70.4 kg 68.9 kg 68.5 kg    Intake/Output:  No intake or output data in the 24 hours ending 10/31/20 0823    Physical Exam    General:  Well appearing. No resp difficulty HEENT: Normal Neck: Supple. JVP . Carotids 2+ bilat; no bruits. No lymphadenopathy or thyromegaly appreciated. Cor: PMI nondisplaced. Regular rate & rhythm. No rubs, gallops or murmurs. Lungs: Clear Abdomen: Soft, nontender, nondistended. No hepatosplenomegaly. No bruits or masses. Good bowel sounds. Extremities: No cyanosis, clubbing, rash, edema Neuro: Alert & orientedx3, cranial nerves grossly intact. moves all 4 extremities w/o difficulty. Affect pleasant   Telemetry   NSR with rare PVCs (personally reviewed)  Labs    CBC Recent Labs    10/28/20 1433  10/29/20 0251 10/30/20 0248 10/31/20 0429  WBC 4.7   < > 4.6 4.9  NEUTROABS 2.3  --   --   --   HGB 13.1   < > 12.4* 12.0*  HCT 39.0   < > 36.1* 35.0*  MCV 97.0   < > 95.3 95.6  PLT 272   < > 275 246   < > = values in this interval not displayed.   Basic Metabolic Panel Recent Labs    38/75/64 0248 10/31/20 0429  NA 138 138  K 3.7 4.0  CL 105 106  CO2 23 24  GLUCOSE 99 92  BUN 26* 25*  CREATININE 1.32* 1.29*  CALCIUM 9.2 9.2   Liver Function Tests No results for input(s): AST, ALT, ALKPHOS, BILITOT, PROT, ALBUMIN in the last 72 hours. No results for input(s): LIPASE, AMYLASE in the last 72 hours. Cardiac Enzymes No results for input(s): CKTOTAL, CKMB, CKMBINDEX, TROPONINI in the last 72 hours.  BNP: BNP (last 3 results) Recent Labs    10/28/20 1433  BNP 1,827.5*    ProBNP (last 3 results) No results for input(s): PROBNP in the last 8760 hours.   D-Dimer No results for input(s): DDIMER in the last 72 hours. Hemoglobin A1C Recent Labs    10/28/20 2006  HGBA1C 5.9*   Fasting Lipid Panel Recent Labs    10/30/20 0248  CHOL 257*  HDL 40*  LDLCALC 204*  TRIG 63  CHOLHDL 6.4   Thyroid Function Tests No results for  input(s): TSH, T4TOTAL, T3FREE, THYROIDAB in the last 72 hours.  Invalid input(s): FREET3  Other results:   Imaging    MR CARDIAC MORPHOLOGY W WO CONTRAST  Result Date: 10/30/2020 CLINICAL DATA:  Ischemic cardiomyopathy, viability EXAM: CARDIAC MRI TECHNIQUE: The patient was scanned on a 1.5 Tesla GE magnet. A dedicated cardiac coil was used. Functional imaging was done using Fiesta sequences. 2,3, and 4 chamber views were done to assess for RWMA's. Modified Simpson's rule using a short axis stack was used to calculate an ejection fraction on a dedicated work Research officer, trade unionstation using Circle software. The patient received 8 cc of Gadavist. After 10 minutes inversion recovery sequences were used to assess for infiltration and scar tissue. FINDINGS: There  is a small right pleural effusion. Atelectasis right lung base. Trivial pericardial effusion. The left ventricle was severely dilated. Thin inferolateral and apex. Global severe hypokinesis with apical, inferior, and inferolateral akinesis. LV EF calculated as 14%. There was a small thrombus at the LV apex. Normal right ventricular size with mildly decreased systolic function, EF 36%. Moderate left atrial enlargement. Normal right atrial size. Trileaflet aortic valve with no significant stenosis or regurgitation. Mitral regurgitation appears moderate visually. Delayed enhancement imaging: Small area of full thickness late gadolinium enhancement (LGE) mid inferolateral wall, but most of inferolateral wall has no significant LGE. 76-99% wall thickness subendocardial LGE in the apical septal wall and at the true apex. Measurements: LVEDV 366 mL LVSV 50 mL LVEF 14% RVEDV 157 mL RVSV 56 mL RVEF 36% IMPRESSION: 1. Severe LV enlargement with severe global hypokinesis with regional variation. EF 14%. 2.  Small apical LV thrombus. 3.  Normal RV size with mildly decreased systolic function, EF 36%. 4.  Moderate mitral regurgitation. 5. Based on severe LV dysfunction, there was less late gadolinium enhancement than I would have expected. With the exception of the apical septal wall and the true apex, would expect the remainder of the LV myocardium to be viable. Troy Johnson Electronically Signed   By: Troy Johnson  Troy Johnson M.D.   On: 10/30/2020 20:23   VAS US DOPPLER PRE CABG  Result Date: 10/30/2020 PREOPERATIVE VASCULAR EVALUATION Patient Name:  Troy Johnson  Date of Exam:   10/30/2020 Medical Rec #: [id number]       Accession #:    [id number] Date of Birth: 08/15/1950      Patient Gender: M Patient Age:   69Y Exam [facility]  [facility] [place] Procedure:      VAS US DOPPLER PRE CABG Referring Phys: [id number] Troy Johnson --------------------------------------------------------------------------------  Indications:       Pre-CABG. Risk Factors:     Past history of smoking, prior MI, coronary artery disease. Comparison Study: No prior studies. Performing Technologist: Troy Johnson MentionHill, Jody RVT, RDMS Supporting Technologist: Jean Rosenthalachel Hodge RDMS,RVT  Examination Guidelines: A complete evaluation includes B-mode imaging, spectral Doppler, color Doppler, and power Doppler as needed of all accessible portions of each vessel. Bilateral testing is considered an integral part of a complete examination. Limited examinations for reoccurring indications may be performed as noted.  Right Carotid Findings: +----------+--------+--------+--------+------------+------------------+           PSV cm/sEDV cm/sStenosisDescribe    Comments           +----------+--------+--------+--------+------------+------------------+ CCA Prox  86      24                          intimal thickening +----------+--------+--------+--------+------------+------------------+ CCA Distal85  21              heterogenous                   +----------+--------+--------+--------+------------+------------------+ ICA Prox  91      26      1-39%   heterogenous                   +----------+--------+--------+--------+------------+------------------+ ICA Distal83      36                                             +----------+--------+--------+--------+------------+------------------+ ECA       80      12                                             +----------+--------+--------+--------+------------+------------------+ Portions of this table do not appear on this page. +----------+--------+-------+----------------+------------+           PSV cm/sEDV cmsDescribe        Arm Pressure +----------+--------+-------+----------------+------------+ Subclavian96             Multiphasic, WNL             +----------+--------+-------+----------------+------------+ +---------+--------+--+--------+--+---------+ VertebralPSV cm/s51EDV cm/s18Antegrade  +---------+--------+--+--------+--+---------+ Left Carotid Findings: +----------+--------+--------+--------+------------+--------+           PSV cm/sEDV cm/sStenosisDescribe    Comments +----------+--------+--------+--------+------------+--------+ CCA Prox  103     31                                   +----------+--------+--------+--------+------------+--------+ CCA Distal98      23              heterogenous         +----------+--------+--------+--------+------------+--------+ ICA Prox  87      31      1-39%   heterogenous         +----------+--------+--------+--------+------------+--------+ ICA Distal65      26                                   +----------+--------+--------+--------+------------+--------+ ECA       89                      heterogenous         +----------+--------+--------+--------+------------+--------+ +----------+--------+--------+----------------+------------+ SubclavianPSV cm/sEDV cm/sDescribe        Arm Pressure +----------+--------+--------+----------------+------------+           88              Multiphasic, WNL             +----------+--------+--------+----------------+------------+ +---------+--------+--+--------+--+---------+ VertebralPSV cm/s27EDV cm/s10Antegrade +---------+--------+--+--------+--+---------+  ABI Findings: +--------+------------------+-----+---------+--------+ Right   Rt Pressure (mmHg)IndexWaveform Comment  +--------+------------------+-----+---------+--------+ [id number]                     triphasic         +--------+------------------+-----+---------+--------+ PTA     99                1.01 triphasic         +--------+------------------+-----+---------+--------+ DP      111  1.13 triphasic         +--------+------------------+-----+---------+--------+ +--------+------------------+-----+---------+-------+ Left    Lt Pressure (mmHg)IndexWaveform Comment  +--------+------------------+-----+---------+-------+ [id number]                     triphasic        +--------+------------------+-----+---------+-------+ PTA     109               1.11 triphasic        +--------+------------------+-----+---------+-------+ DP      106               1.08 triphasic        +--------+------------------+-----+---------+-------+  Right Doppler Findings: +--------+--------+-----+---------+--------+ Site    PressureIndexDoppler  Comments +--------+--------+-----+---------+--------+ [id number]           triphasic         +--------+--------+-----+---------+--------+ Radial               triphasic         +--------+--------+-----+---------+--------+ Ulnar                triphasic         +--------+--------+-----+---------+--------+  Left Doppler Findings: +--------+--------+-----+---------+--------+ Site    PressureIndexDoppler  Comments +--------+--------+-----+---------+--------+ [id number]           triphasic         +--------+--------+-----+---------+--------+ Radial               triphasic         +--------+--------+-----+---------+--------+ Ulnar                triphasic         +--------+--------+-----+---------+--------+  Summary: Right Carotid: Velocities in the right ICA are consistent with a 1-39% stenosis. Left Carotid: Velocities in the left ICA are consistent with a 1-39% stenosis. Vertebrals:  Bilateral vertebral arteries demonstrate antegrade flow. Subclavians: Normal flow hemodynamics were seen in bilateral subclavian              arteries. Right ABI: Resting right ankle-brachial index is within normal range. No evidence of significant right lower extremity arterial disease. Left ABI: Resting left ankle-brachial index is within normal range. No evidence of significant left lower extremity arterial disease. Right Upper Extremity: Doppler waveforms remain within normal limits with right radial compression.  Doppler waveforms remain within normal limits with right ulnar compression. Left Upper Extremity: Doppler waveform obliterate with left radial compression. Doppler waveforms remain within normal limits with left ulnar compression.  Electronically signed by Heath Lark on 10/30/2020 at 5:06:47 PM.    Final       Medications:     Scheduled Medications: . aspirin EC  81 mg Oral Daily  . atorvastatin  80 mg Oral Daily  . digoxin  0.125 mg Oral Daily  . epinephrine  0-10 mcg/min Intravenous To OR  . heparin-papaverine-plasmalyte irrigation   Irrigation To OR  . insulin   Intravenous To OR  . ivabradine  5 mg Oral BID WC  . [place] Blood Cardioplegia vial (lidocaine/magnesium/mannitol 0.26g-4g-6.4g)   Intracoronary Once  . losartan  12.5 mg Oral Daily  . phenylephrine  30-200 mcg/min Intravenous To OR  . potassium chloride  80 mEq Other To OR  . sodium chloride flush  3 mL Intravenous Q12H  . sodium chloride flush  3 mL Intravenous Q12H  . spironolactone  12.5 mg Oral Daily  . tranexamic acid  15 mg/kg Intravenous To OR  . tranexamic acid  2 mg/kg Intracatheter To OR  Infusions: . sodium chloride    . sodium chloride    . dexmedetomidine    . heparin 30,000 units/NS 1000 mL solution for CELLSAVER    . heparin 1,400 Units/hr (10/30/20 1737)  . levofloxacin (LEVAQUIN) IV    . milrinone    . nitroGLYCERIN    . norepinephrine    . tranexamic acid (CYKLOKAPRON) infusion ([facility])    . vancomycin       PRN Medications:  sodium chloride, sodium chloride, acetaminophen, ipratropium-albuterol, ondansetron (ZOFRAN) IV, sodium chloride flush, sodium chloride flush   Assessment/Plan   1. CAD: No chest pain, presented with dyspnea.  NSTEMI with HS-TnI elevated to peak 746.  Cath showed severe multivessel CAD with reasonable targets for CABG.  However, EF < 20%.  Cardiac MRI suggested extensive viability (except for apical septal wall and true apex).  Think he is a CABG candidate but  will be high risk.  - ASA 81 + atorvastatin 80 mg daily.  - High risk CABG, timing per Dr. Cliffton Asters.  Would consider Impella 5.5 placement for post-operative period.  2. Acute systolic CHF: Ischemic cardiomyopathy.  Echo this admission with EF <20%, the septum and apex were akinetic, RV function looked low normal, moderate MR.  Cardiac MRI showed EF 14% with scar in the apical septal wall and apex, mildly decreased RV systolic function. Suspect significant viability as above.  He is not volume overloaded on exam and RHC showed normal filling pressures.  CI preserved on RHC at 2.5.   - Continue Corlanor 5 mg bid, HR 70s.  - Continue digoxin 0.125 daily.  - Continue losartan 12.5 mg daily, would not increase pre-CABG.  - Can add spironolactone 12.5 mg daily.  - Would hope for improvement in function with revascularization.  3. LV thrombus: Small, noted on echo and cMRI.  - Heparin gtt for now.  4. Mitral regurgitation: Likely functional.  Moderate.  5. Hyperlipidemia: Very high LDL.   - Atorvastatin 80 daily.  - May need Repatha in future.   Length of Stay: 3  Troy Ancona, MD  10/31/2020, 8:23 AM  Advanced Heart Failure Team Pager [phone] (M-F; 7a - 5p)  Please contact [facility] for night-coverage after hours (5p -7a ) and weekends on [email or web address]

## 2020-10-31 NOTE — Plan of Care (Signed)

## 2020-10-31 NOTE — Progress Notes (Signed)
ANTICOAGULATION CONSULT NOTE - Follow Up Consult  Pharmacy Consult for heparin Indication: apical thrombus  Allergies  Allergen Reactions  . Penicillins Other (See Comments)    childhood    Patient Measurements: Height: 6\' 1"  (185.4 cm) Weight: 68.5 kg (151 lb 1.6 oz) IBW/kg (Calculated) : 79.9 Heparin Dosing Weight: 77kg  Vital Signs: Temp: 98.1 F (36.7 C) (05/05 1641) Temp Source: Oral (05/05 1641) BP: 102/75 (05/05 1641) Pulse Rate: 73 (05/05 1641)  Labs: Recent Labs    10/29/20 0251 10/29/20 0806 10/29/20 0808 10/29/20 1000 10/30/20 0248 10/30/20 1011 10/31/20 0429 10/31/20 1618  HGB 12.8*   < > 12.9*  --  12.4*  --  12.0*  --   HCT 37.2*   < > 38.0*  --  36.1*  --  35.0*  --   PLT 289  --   --   --  275  --  246  --   HEPARINUNFRC 0.24*  --   --    < > 0.29* 0.20* 0.92* 0.83*  CREATININE 1.31*  --   --   --  1.32*  --  1.29*  --    < > = values in this interval not displayed.    Estimated Creatinine Clearance: 52.4 mL/min (A) (by C-G formula based on SCr of 1.29 mg/dL (H)).   Medical History: Past Medical History:  Diagnosis Date  . Anemia   . Asthma    childhood  . Blood in stool   . Childhood asthma   . GI bleed   . Kidney laceration 1999  . Seizures (HCC)    Assessment: 18 yoM admitted with SOB found to have mvCAD and apical thrombus. Pt started on IV heparin, no AC PTA.  Heparin level continues to be elevated after rate adjustment this morning. CBC has been stable, no bleeding issues noted.  Goal of Therapy:  Heparin level 0.3-0.7 units/ml Monitor platelets by anticoagulation protocol: Yes   Plan:  Reduce heparin to 1150 units/h Recheck heparin level in 8h Daily heparin level and CBC  78 PharmD., BCPS Clinical Pharmacist 10/31/2020 5:41 PM

## 2020-10-31 NOTE — Consult Note (Signed)
NAME:  Troy Johnson, MRN:  [id number], DOB:  Nov 16, 1950, LOS: 3 ADMISSION DATE:  10/28/2020, CONSULTATION DATE:  10/31/2020 REFERRING MD:  Dr. Cliffton Asters, CHIEF COMPLAINT:   Acute heart failure   History of Present Illness:  Troy Johnson is a 70 y.o. with a PMH significant for seizures, GI bleed, and childhood asthma who presented with new onset shortness of breath with eventual development of orthopnea. Symptoms began approximately 1 week before admission.  Given concern for new onset congestive heart failure patient was admitted by [facility] with cardiology consult,   Thus far during admission patient has been diagnosised with Multivessel CAD, NSTEMI, acute systolic CHF with ischemic cardiomyopathy, LV thrombus, mitral regurgitation, and hyperlipidemia. Extensive cardiac workup indicates that patient is likely a good candidate for CABG but he will be high risk. Therefore PCCM was consult for assistance in preop evaluation and care.     Pertinent  Medical History  Seizures, GI bleed, and childhood asthma  Significant Hospital Events: Including procedures, antibiotic start and stop dates in addition to other pertinent events   . 5/2 admitted for new onset shortness of breath  . 5/5 PCCM consulted  Interim History / Subjective:  As above   Objective   Blood pressure 101/73, pulse 78, temperature 97.7 F (36.5 C), temperature source Oral, resp. rate 18, height 6\' 1"  (1.854 m), weight 68.5 kg, SpO2 99 %.       No intake or output data in the 24 hours ending 10/31/20 1114 Filed Weights   10/29/20 0413 10/30/20 0531 10/31/20 0512  Weight: 70.4 kg 68.9 kg 68.5 kg    Examination: General: Very pleasant middle aged male lying in bed in NAD HEENT: [place]/AT, MM pink/moist, PERRL,  Neuro: Alert and oriented x3, non-focal  CV: s1s2 regular rate and rhythm, no murmur, rubs, or gallops,  PULM:  Clear to ascultation bilaterally, no added breath sounds, on RA GI: soft, bowel sounds active in all 4  quadrants, non-tender, non-distended Extremities: warm/dry, no edema  Skin: no rashes or lesions  Labs/imaging that I have personally reviewed    5/2 CXR > Borderline mild congestive heart failure. Trace right pleural effusion.  5/4 Cardiac MRI > Severe LV enlargement with severe global hypokinesis with regional variation. EF 14%. Small apical LV thrombus. Normal RV size with mildly decreased systolic function, EF 36%. Moderate mitral regurgitation. Expect the remainder of the LV myocardium to be viable.  5/3 ECHO > EF < 20%, global hypokinesis of LV, small LV thrombus,  Grade II diastolic dysfunction   Resolved Hospital Problem list     Assessment & Plan:  Multivessel CAD -Extensive cardiac workup indicates that patient is likely a good candidate for CABG but he will be high risk.  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 LV thrombus -Confirmed on cardiac MRI and ECHO  Moderate mitral regurgitation Family hx of HTN  -Patient was recently started on Benicar for very mildly elevated BP at PCP office HLD P: Primary management per CTS, HF team, and cardiology  Continue digoxin and Ivabradine,  Continue ASA and  statin  Heparin drip per pharmacy  Daily weight  Strict I&O Continuous telemetry  Optimize electrolytes   At risk Acute Kidney Injury  -Baseline creatinine 1.1 - 1.2 but GFR consistently > 60,  P: Follow renal function / urine output Trend Bmet Avoid nephrotoxins Ensure adequate renal perfusion   Hx of childhood asthma  -States he "grow out of this" many years ago Prior tobacco abuse  -  Quit 30 years ago  P: Ensure adequate pulmonary hygiene  Educate on use of IS and flutter preop  Follow intermittent chest x-ray and ABG.   Mobilize as able   At risk malnutrition P: Supplement protein  Consult dietitian  Encourage oral intake    Best practice   Diet:  Oral - Cardiac  Pain/Anxiety/Delirium protocol (if indicated):  No VAP protocol (if indicated): Not indicated DVT prophylaxis: Subcutaneous Heparin GI prophylaxis: N/A Glucose control:  SSI No Central venous access:  N/A Arterial line:  N/A Foley:  N/A Mobility:  bed rest  PT consulted: N/A Last date of multidisciplinary goals of care discussion Upate patient and family daily  Code Status:  full code Disposition: Cardiac floor   Labs   CBC: Recent Labs  Lab 10/28/20 1433 10/29/20 0251 10/29/20 0806 10/29/20 0808 10/30/20 0248 10/31/20 0429  WBC 4.7 5.1  --   --  4.6 4.9  NEUTROABS 2.3  --   --   --   --   --   HGB 13.1 12.8* 12.6* 12.9* 12.4* 12.0*  HCT 39.0 37.2* 37.0* 38.0* 36.1* 35.0*  MCV 97.0 94.7  --   --  95.3 95.6  PLT 272 289  --   --  275 246    Basic Metabolic Panel: Recent Labs  Lab 10/28/20 1433 10/28/20 1619 10/29/20 0251 10/29/20 0806 10/29/20 0808 10/30/20 0248 10/31/20 0429  NA 136 139 137 139 140 138 138  K 5.9* 4.2 3.7 3.5 3.7 3.7 4.0  CL 106 108 104  --   --  105 106  CO2 21* 20* 23  --   --  23 24  GLUCOSE 106* 92 98  --   --  99 92  BUN 20 17 18   --   --  26* 25*  CREATININE 1.23 1.21 1.31*  --   --  1.32* 1.29*  CALCIUM 8.9 9.4 9.1  --   --  9.2 9.2   GFR: Estimated Creatinine Clearance: 52.4 mL/min (A) (by C-G formula based on SCr of 1.29 mg/dL (H)). Recent Labs  Lab 10/28/20 1433 10/29/20 0251 10/30/20 0248 10/31/20 0429  WBC 4.7 5.1 4.6 4.9    Liver Function Tests: No results for input(s): AST, ALT, ALKPHOS, BILITOT, PROT, ALBUMIN in the last 168 hours. No results for input(s): LIPASE, AMYLASE in the last 168 hours. No results for input(s): AMMONIA in the last 168 hours.  ABG    Component Value Date/Time   PHART 7.402 10/29/2020 0808   PCO2ART 37.7 10/29/2020 0808   PO2ART 84 10/29/2020 0808   HCO3 23.4 10/29/2020 0808   TCO2 25 10/29/2020 0808   ACIDBASEDEF 1.0 10/29/2020 0808   O2SAT 96.0 10/29/2020 0808     Coagulation Profile: No results for input(s): INR, PROTIME in the  last 168 hours.  Cardiac Enzymes: No results for input(s): CKTOTAL, CKMB, CKMBINDEX, TROPONINI in the last 168 hours.  HbA1C: Hgb A1c MFr Bld  Date/Time Value Ref Range Status  10/28/2020 08:06 PM 5.9 (H) 4.8 - 5.6 % Final    Comment:    (NOTE) Pre diabetes:          5.7%-6.4%  Diabetes:              >6.4%  Glycemic control for   <7.0% adults with diabetes   11/24/2019 11:02 AM 6.0 (H) 4.8 - 5.6 % Final    Comment:             Prediabetes: 5.7 -  6.4          Diabetes: >6.4          Glycemic control for adults with diabetes: <7.0     CBG: No results for input(s): GLUCAP in the last 168 hours.  Review of Systems:   Please see the history of present illness. All other systems reviewed and are negative   Past Medical History:  He,  has a past medical history of Anemia, Asthma, Blood in stool, Childhood asthma, GI bleed, Kidney laceration (1999), and Seizures (HCC).   Surgical History:   Past Surgical History:  Procedure Laterality Date  . KIDNEY SURGERY     for laceration  . RIGHT/LEFT HEART CATH AND CORONARY ANGIOGRAPHY N/A 10/29/2020   Procedure: RIGHT/LEFT HEART CATH AND CORONARY ANGIOGRAPHY;  Surgeon: Elder Negus, MD;  Location: [facility] INVASIVE CV LAB;  Service: Cardiovascular;  Laterality: N/A;     Social History:   reports that he has quit smoking. He has never used smokeless tobacco. He reports current alcohol use. He reports that he does not use drugs.   Family History:  His family history includes Cervical cancer in his mother; Hypertension in his father. There is no history of Colon cancer, Pancreatic cancer, Esophageal cancer, or Stomach cancer.   Allergies Allergies  Allergen Reactions  . Penicillins Other (See Comments)    childhood     Home Medications  Prior to Admission medications   Medication Sig Start Date End Date Taking? Authorizing Provider  olmesartan (BENICAR) 20 MG tablet Take 20 mg by mouth daily. 09/23/20  Yes [provider]     Signature:   Delfin Gant, NP-C [facility] Personal contact information can be found on Amion  10/31/2020, 11:56 AM

## 2020-10-31 NOTE — Progress Notes (Deleted)
ANTICOAGULATION CONSULT NOTE Pharmacy Consult for heparin Indication: apical thrombus  Allergies  Allergen Reactions  . Penicillins Other (See Comments)    childhood    Patient Measurements: Height: 6\' 1"  (185.4 cm) Weight: 68.5 kg (151 lb 1.6 oz) IBW/kg (Calculated) : 79.9 Heparin Dosing Weight: 77kg  Vital Signs: Temp: 97.7 F (36.5 C) (05/05 0507) Temp Source: Oral (05/05 0507) BP: 101/73 (05/05 0507) Pulse Rate: 78 (05/04 2028)  Labs: Recent Labs    10/28/20 1433 10/28/20 1619 10/28/20 2006 10/29/20 0251 10/29/20 0806 10/29/20 0808 10/29/20 1000 10/30/20 0248 10/30/20 1011 10/31/20 0429  HGB 13.1  --   --  12.8*   < > 12.9*  --  12.4*  --  12.0*  HCT 39.0  --   --  37.2*   < > 38.0*  --  36.1*  --  35.0*  PLT 272  --   --  289  --   --   --  275  --  246  HEPARINUNFRC  --   --    < > 0.24*  --   --    < > 0.29* 0.20* 0.92*  CREATININE 1.23 1.21  --  1.31*  --   --   --  1.32*  --  1.29*  TROPONINIHS 656* 746*  --   --   --   --   --   --   --   --    < > = values in this interval not displayed.    Estimated Creatinine Clearance: 52.4 mL/min (A) (by C-G formula based on SCr of 1.29 mg/dL (H)).  Assessment: 70 y.o. male with apical thrombus for heparin  Goal of Therapy:  Heparin level 0.3-0.7 units/ml Monitor platelets by anticoagulation protocol: Yes   Plan:  Decrease Heparin 1200 units/hr Check heparin level in 8 hours.  78, PharmD, BCPS  10/31/2020

## 2020-10-31 NOTE — Progress Notes (Signed)
     [address]       [name] [address]             [phone]       I had a long discussion with Mr. Shipley and his niece today.  We went over the findings of the cardiac MRI.  He does have viability in his LV, and his right heart function is at 40%.  Given this will be a candidate for surgical revascularization after medical optimization.  He is tentatively scheduled for Tuesday, Nov 05, 2020.  Kenley Rettinger Keane Scrape

## 2020-10-31 NOTE — Progress Notes (Signed)
ANTICOAGULATION CONSULT NOTE - Follow Up Consult  Pharmacy Consult for heparin Indication: apical thrombus  Allergies  Allergen Reactions  . Penicillins Other (See Comments)    childhood    Patient Measurements: Height: 6\' 1"  (185.4 cm) Weight: 68.5 kg (151 lb 1.6 oz) IBW/kg (Calculated) : 79.9 Heparin Dosing Weight: 77kg  Vital Signs: Temp: 97.7 F (36.5 C) (05/05 0507) Temp Source: Oral (05/05 0507) BP: 101/73 (05/05 0507)  Labs: Recent Labs    10/28/20 1433 10/28/20 1619 10/28/20 2006 10/29/20 0251 10/29/20 0806 10/29/20 0808 10/29/20 1000 10/30/20 0248 10/30/20 1011 10/31/20 0429  HGB 13.1  --   --  12.8*   < > 12.9*  --  12.4*  --  12.0*  HCT 39.0  --   --  37.2*   < > 38.0*  --  36.1*  --  35.0*  PLT 272  --   --  289  --   --   --  275  --  246  HEPARINUNFRC  --   --    < > 0.24*  --   --    < > 0.29* 0.20* 0.92*  CREATININE 1.23 1.21  --  1.31*  --   --   --  1.32*  --  1.29*  TROPONINIHS 656* 746*  --   --   --   --   --   --   --   --    < > = values in this interval not displayed.    Estimated Creatinine Clearance: 52.4 mL/min (A) (by C-G formula based on SCr of 1.29 mg/dL (H)).   Medical History: Past Medical History:  Diagnosis Date  . Anemia   . Asthma    childhood  . Blood in stool   . Childhood asthma   . GI bleed   . Kidney laceration 1999  . Seizures (HCC)    Assessment: 24 yoM admitted with SOB found to have mvCAD and apical thrombus. Pt started on IV heparin, no AC PTA.  Heparin level now elevated, CBC stable.  Goal of Therapy:  Heparin level 0.3-0.7 units/ml Monitor platelets by anticoagulation protocol: Yes   Plan:  Reduce heparin to 1300 units/h Recheck heparin level in 6h Daily heparin level and CBC   78, PharmD, [place], [facility] Clinical Pharmacist [phone] Please check AMION for all [facility] Pharmacy numbers 10/31/2020

## 2020-10-31 NOTE — TOC Initial Note (Signed)
Transition of Care (TOC) - Initial/Assessment Note  Heart Failure   Patient Details  Name: Troy Johnson MRN: [id number] Date of Birth: 1951/01/19  Transition of [facility]) CM/SW Contact:    Antoria Lanza, LCSWA Phone Number: 10/31/2020, 12:22 PM  Clinical Narrative:                 CSW spoke with the patient at bedside and completed a very brief SDOH screening with the patient who denied having any needs at this time. Patient reported they do have a PCP and they can get to the pharmacy to pick up their medications. Mr. Rouch reported that he lives alone but does have a support system in place as needed. CSW provided the patient with the social workers name, number and position and to reach out to CSW as other social needs arise.       TOC will continue to follow throughout discharge.   Barriers to Discharge: Continued Medical Work up   Patient Goals and CMS Choice        Expected Discharge Plan and Services   In-house Referral: Clinical Social Work                                            Prior Living Arrangements/Services     Patient language and need for interpreter reviewed:: Yes        Need for Family Participation in Patient Care: No (Comment) Care giver support system in place?: No (comment)   Criminal Activity/Legal Involvement Pertinent to Current Situation/Hospitalization: No - Comment as needed  Activities of Daily Living Home Assistive Devices/Equipment: None ADL Screening (condition at time of admission) Patient's cognitive ability adequate to safely complete daily activities?: Yes Is the patient deaf or have difficulty hearing?: No Does the patient have difficulty seeing, even when wearing glasses/contacts?: No Does the patient have difficulty concentrating, remembering, or making decisions?: No Patient able to express need for assistance with ADLs?: Yes Does the patient have difficulty dressing or bathing?: No Independently performs  ADLs?: Yes (appropriate for developmental age) Does the patient have difficulty walking or climbing stairs?: No Weakness of Legs: None Weakness of Arms/Hands: None  Permission Sought/Granted                  Emotional Assessment Appearance:: Appears stated age Attitude/Demeanor/Rapport: Engaged Affect (typically observed): Pleasant Orientation: : Oriented to Self,Oriented to Place,Oriented to  Time,Oriented to Situation Alcohol / Substance Use: Not Applicable Psych Involvement: No (comment)  Admission diagnosis:  CHF (congestive heart failure) (HCC) [I50.9] SOB (shortness of breath) [R06.02] NSTEMI (non-ST elevated myocardial infarction) (HCC) [I21.4] Heart failure, unspecified HF chronicity, unspecified heart failure type (HCC) [I50.9] Patient Active Problem List   Diagnosis Date Noted  . Acute CHF (congestive heart failure) (HCC) 10/28/2020  . HFrEF (heart failure with reduced ejection fraction) (HCC) 10/28/2020  . NSTEMI (non-ST elevated myocardial infarction) (HCC)   . Prediabetes 12/10/2019  . Anemia 12/16/2016  . Syncope 12/16/2016  . H/O: GI bleed 12/09/2016   PCP:  [name], MD Pharmacy:   [facility] [phone] - [place] - [address] AT [facility] [address], [address] [place] [address] Phone: [phone] Fax: [phone]     Social Determinants of Health (SDOH) Interventions Food Insecurity Interventions: Intervention Not Indicated Financial Strain Interventions: Intervention Not Indicated Housing Interventions:  Intervention Not Indicated Transportation Interventions: Intervention Not Indicated  Readmission Risk Interventions No flowsheet data found.  Amey Hossain, MSW, LCSWA [phone] Heart Failure Social Worker

## 2020-11-01 ENCOUNTER — Other Ambulatory Visit (HOSPITAL_COMMUNITY): Payer: Self-pay

## 2020-11-01 DIAGNOSIS — I513 Intracardiac thrombosis, not elsewhere classified: Secondary | ICD-10-CM

## 2020-11-01 DIAGNOSIS — I5041 Acute combined systolic (congestive) and diastolic (congestive) heart failure: Secondary | ICD-10-CM

## 2020-11-01 DIAGNOSIS — I214 Non-ST elevation (NSTEMI) myocardial infarction: Secondary | ICD-10-CM | POA: Diagnosis not present

## 2020-11-01 DIAGNOSIS — I255 Ischemic cardiomyopathy: Secondary | ICD-10-CM

## 2020-11-01 DIAGNOSIS — E7800 Pure hypercholesterolemia, unspecified: Secondary | ICD-10-CM

## 2020-11-01 DIAGNOSIS — I5021 Acute systolic (congestive) heart failure: Secondary | ICD-10-CM | POA: Diagnosis not present

## 2020-11-01 DIAGNOSIS — I5020 Unspecified systolic (congestive) heart failure: Secondary | ICD-10-CM | POA: Diagnosis not present

## 2020-11-01 DIAGNOSIS — I2510 Atherosclerotic heart disease of native coronary artery without angina pectoris: Secondary | ICD-10-CM

## 2020-11-01 LAB — CBC
HCT: 33.3 % — ABNORMAL LOW (ref 39.0–52.0)
Hemoglobin: 11.5 g/dL — ABNORMAL LOW (ref 13.0–17.0)
MCH: 33 pg (ref 26.0–34.0)
MCHC: 34.5 g/dL (ref 30.0–36.0)
MCV: 95.4 fL (ref 80.0–100.0)
Platelets: 228 10*3/uL (ref 150–400)
RBC: 3.49 MIL/uL — ABNORMAL LOW (ref 4.22–5.81)
RDW: 15 % (ref 11.5–15.5)
WBC: 4.2 10*3/uL (ref 4.0–10.5)
nRBC: 0 % (ref 0.0–0.2)

## 2020-11-01 LAB — HEPARIN LEVEL (UNFRACTIONATED): Heparin Unfractionated: 0.65 IU/mL (ref 0.30–0.70)

## 2020-11-01 LAB — BASIC METABOLIC PANEL
Anion gap: 7 (ref 5–15)
BUN: 24 mg/dL — ABNORMAL HIGH (ref 8–23)
CO2: 23 mmol/L (ref 22–32)
Calcium: 9.1 mg/dL (ref 8.9–10.3)
Chloride: 106 mmol/L (ref 98–111)
Creatinine, Ser: 1.36 mg/dL — ABNORMAL HIGH (ref 0.61–1.24)
GFR, Estimated: 56 mL/min — ABNORMAL LOW (ref >60)
Glucose, Bld: 85 mg/dL (ref 70–99)
Potassium: 3.9 mmol/L (ref 3.5–5.1)
Sodium: 136 mmol/L (ref 135–145)

## 2020-11-01 MED ORDER — PROSOURCE PLUS PO LIQD
30.0000 mL | Freq: Two times a day (BID) | ORAL | Status: DC
  Administered 2020-11-01 – 2020-11-08 (×12): 30 mL via ORAL
  Filled 2020-11-01 (×14): qty 30

## 2020-11-01 MED ORDER — DAPAGLIFLOZIN PROPANEDIOL 10 MG PO TABS
10.0000 mg | ORAL_TABLET | Freq: Every day | ORAL | Status: DC
  Administered 2020-11-01 – 2020-11-04 (×4): 10 mg via ORAL
  Filled 2020-11-01 (×5): qty 1

## 2020-11-01 MED ORDER — ADULT MULTIVITAMIN W/MINERALS CH
1.0000 | ORAL_TABLET | Freq: Every day | ORAL | Status: DC
  Administered 2020-11-01 – 2020-11-14 (×13): 1 via ORAL
  Filled 2020-11-01 (×13): qty 1

## 2020-11-01 MED ORDER — ENSURE ENLIVE PO LIQD
237.0000 mL | ORAL | Status: DC
  Administered 2020-11-02 – 2020-11-03 (×2): 237 mL via ORAL

## 2020-11-01 NOTE — Progress Notes (Signed)
Progress Note  Patient Name: Eldrige Pitkin Date of Encounter: 11/01/2020  Attending physician: Kathlen Mody, MD Primary care provider: [name], MD  Subjective: Troy Johnson is a 70 y.o. male who was seen and examined at bedside at approximately 9am.  No events overnight. Denies chest pain at rest or with effort related activities. Ambulated on the floors earlier this morning with minimal shortness of breath, per patient Appears to be euvolemic.  Objective: Vital Signs in the last 24 hours: Temp:  [97.1 F (36.2 C)-98.6 F (37 C)] 98.6 F (37 C) (05/06 0500) Pulse Rate:  [69-82] 82 (05/06 0500) Resp:  [18] 18 (05/06 0500) BP: (95-108)/(64-75) 108/73 (05/06 0500) SpO2:  [98 %-99 %] 98 % (05/06 0500) Weight:  [69.4 kg] 69.4 kg (05/06 0500)  Intake/Output:  Intake/Output Summary (Last 24 hours) at 11/01/2020 0910 Last data filed at 10/31/2020 2015 Gross per 24 hour  Intake 258 ml  Output --  Net 258 ml    Net IO Since Admission: -47.23 mL [11/01/20 0910]  Weights:  Filed Weights   10/30/20 0531 10/31/20 0512 11/01/20 0500  Weight: 68.9 kg 68.5 kg 69.4 kg    Telemetry: Personally reviewed.  Sinus rhythm with rare PVCs and rare ventricular runs.  Physical examination: PHYSICAL EXAM: Vitals with BMI 11/01/2020 10/31/2020 10/31/2020  Height - - -  Weight 153 lbs 2 oz - -  BMI 20.2 - -  Systolic 108 95 102  Diastolic 73 64 75  Pulse 82 69 73    CONSTITUTIONAL: Well-developed and well-nourished. No acute distress.  SKIN: Skin is warm and dry. No rash noted. No cyanosis. No pallor. No jaundice HEAD: Normocephalic and atraumatic.  EYES: No scleral icterus MOUTH/THROAT: Moist oral membranes.  NECK: JVP 7-8 cm, No thyromegaly noted. No carotid bruits  LYMPHATIC: No visible cervical adenopathy.  CHEST Normal respiratory effort. No intercostal retractions  LUNGS: Clear to auscultation bilaterally.  No stridor. No wheezes. No rales.  CARDIOVASCULAR: Regular,  positive S1-S2, no murmurs rubs or gallops appreciated ABDOMINAL: Soft, nontender, nondistended, no apparent ascites.  EXTREMITIES: No peripheral edema  HEMATOLOGIC: No significant bruising NEUROLOGIC: Oriented to person, place, and time. Nonfocal. Normal muscle tone.  PSYCHIATRIC: Normal mood and affect. Normal behavior. Cooperative  Lab Results: Hematology Recent Labs  Lab 10/30/20 0248 10/31/20 0429 11/01/20 0242  WBC 4.6 4.9 4.2  RBC 3.79* 3.66* 3.49*  HGB 12.4* 12.0* 11.5*  HCT 36.1* 35.0* 33.3*  MCV 95.3 95.6 95.4  MCH 32.7 32.8 33.0  MCHC 34.3 34.3 34.5  RDW 14.8 15.0 15.0  PLT 275 246 228    Chemistry Recent Labs  Lab 10/30/20 0248 10/31/20 0429 11/01/20 0242  NA 138 138 136  K 3.7 4.0 3.9  CL 105 106 106  CO2 23 24 23   GLUCOSE 99 92 85  BUN 26* 25* 24*  CREATININE 1.32* 1.29* 1.36*  CALCIUM 9.2 9.2 9.1  GFRNONAA 58* >60 56*  ANIONGAP 10 8 7      Cardiac Enzymes: Cardiac Panel (last 3 results) No results for input(s): CKTOTAL, CKMB, TROPONINIHS, RELINDX in the last 72 hours.  BNP (last 3 results) Recent Labs    10/28/20 1433  BNP 1,827.5*    ProBNP (last 3 results) No results for input(s): PROBNP in the last 8760 hours.   DDimer No results for input(s): DDIMER in the last 168 hours.   Hemoglobin A1c:  Lab Results  Component Value Date   HGBA1C 5.9 (H) 10/28/2020   MPG 122.63 10/28/2020  TSH No results for input(s): TSH in the last 8760 hours.  Lipid Panel     Component Value Date/Time   CHOL 257 (H) 10/30/2020 0248   TRIG 63 10/30/2020 0248   HDL 40 (L) 10/30/2020 0248   CHOLHDL 6.4 10/30/2020 0248   VLDL 13 10/30/2020 0248   LDLCALC 204 (H) 10/30/2020 0248    Imaging: MR CARDIAC MORPHOLOGY W WO CONTRAST  Result Date: 10/30/2020 CLINICAL DATA:  Ischemic cardiomyopathy, viability EXAM: CARDIAC MRI TECHNIQUE: The patient was scanned on a 1.5 Tesla GE magnet. A dedicated cardiac coil was used. Functional imaging was done using  Fiesta sequences. 2,3, and 4 chamber views were done to assess for RWMA's. Modified Simpson's rule using a short axis stack was used to calculate an ejection fraction on a dedicated work [employer]. The patient received 8 cc of Gadavist. After 10 minutes inversion recovery sequences were used to assess for infiltration and scar tissue. FINDINGS: There is a small right pleural effusion. Atelectasis right lung base. Trivial pericardial effusion. The left ventricle was severely dilated. Thin inferolateral and apex. Global severe hypokinesis with apical, inferior, and inferolateral akinesis. LV EF calculated as 14%. There was a small thrombus at the LV apex. Normal right ventricular size with mildly decreased systolic function, EF 36%. Moderate left atrial enlargement. Normal right atrial size. Trileaflet aortic valve with no significant stenosis or regurgitation. Mitral regurgitation appears moderate visually. Delayed enhancement imaging: Small area of full thickness late gadolinium enhancement (LGE) mid inferolateral wall, but most of inferolateral wall has no significant LGE. 76-99% wall thickness subendocardial LGE in the apical septal wall and at the true apex. Measurements: LVEDV 366 mL LVSV 50 mL LVEF 14% RVEDV 157 mL RVSV 56 mL RVEF 36% IMPRESSION: 1. Severe LV enlargement with severe global hypokinesis with regional variation. EF 14%. 2.  Small apical LV thrombus. 3.  Normal RV size with mildly decreased systolic function, EF 36%. 4.  Moderate mitral regurgitation. 5. Based on severe LV dysfunction, there was less late gadolinium enhancement than I would have expected. With the exception of the apical septal wall and the true apex, would expect the remainder of the LV myocardium to be viable. Dalton Mclean Electronically Signed   By: Marca Ancona M.D.   On: 10/30/2020 20:23   VAS US DOPPLER PRE CABG  Result Date: 10/30/2020 PREOPERATIVE VASCULAR EVALUATION Patient Name:  ANTHANY THORNHILL   Date of Exam:   10/30/2020 Medical Rec #: [id number]       Accession #:    [id number] Date of Birth: [birth year]      Patient Gender: M Patient Age:   69Y Exam Location:  [facility] Procedure:      VAS US DOPPLER PRE CABG Referring Phys: [id number] HARRELL O LIGHTFOOT --------------------------------------------------------------------------------  Indications:      Pre-CABG. Risk Factors:     Past history of smoking, prior MI, coronary artery disease. Comparison Study: No prior studies. Performing Technologist: Ernestene Mention RVT, RDMS Supporting Technologist: Jean Rosenthal RDMS,RVT  Examination Guidelines: A complete evaluation includes B-mode imaging, spectral Doppler, color Doppler, and power Doppler as needed of all accessible portions of each vessel. Bilateral testing is considered an integral part of a complete examination. Limited examinations for reoccurring indications may be performed as noted.  Right Carotid Findings: +----------+--------+--------+--------+------------+------------------+           PSV cm/sEDV cm/sStenosisDescribe    Comments           +----------+--------+--------+--------+------------+------------------+ CCA Prox  86  24                          intimal thickening +----------+--------+--------+--------+------------+------------------+ CCA Distal85      21              heterogenous                   +----------+--------+--------+--------+------------+------------------+ ICA Prox  91      26      1-39%   heterogenous                   +----------+--------+--------+--------+------------+------------------+ ICA Distal83      36                                             +----------+--------+--------+--------+------------+------------------+ ECA       80      12                                             +----------+--------+--------+--------+------------+------------------+ Portions of this table do not appear on this page.  +----------+--------+-------+----------------+------------+           PSV cm/sEDV cmsDescribe        Arm Pressure +----------+--------+-------+----------------+------------+ Subclavian96             Multiphasic, WNL             +----------+--------+-------+----------------+------------+ +---------+--------+--+--------+--+---------+ VertebralPSV cm/s51EDV cm/s18Antegrade +---------+--------+--+--------+--+---------+ Left Carotid Findings: +----------+--------+--------+--------+------------+--------+           PSV cm/sEDV cm/sStenosisDescribe    Comments +----------+--------+--------+--------+------------+--------+ CCA Prox  103     31                                   +----------+--------+--------+--------+------------+--------+ CCA Distal98      23              heterogenous         +----------+--------+--------+--------+------------+--------+ ICA Prox  87      31      1-39%   heterogenous         +----------+--------+--------+--------+------------+--------+ ICA Distal65      26                                   +----------+--------+--------+--------+------------+--------+ ECA       89                      heterogenous         +----------+--------+--------+--------+------------+--------+ +----------+--------+--------+----------------+------------+ SubclavianPSV cm/sEDV cm/sDescribe        Arm Pressure +----------+--------+--------+----------------+------------+           88              Multiphasic, WNL             +----------+--------+--------+----------------+------------+ +---------+--------+--+--------+--+---------+ VertebralPSV cm/s27EDV cm/s10Antegrade +---------+--------+--+--------+--+---------+  ABI Findings: +--------+------------------+-----+---------+--------+ Right   Rt Pressure (mmHg)IndexWaveform Comment  +--------+------------------+-----+---------+--------+ [id number]                     triphasic          +--------+------------------+-----+---------+--------+ PTA  99                1.01 triphasic         +--------+------------------+-----+---------+--------+ DP      111               1.13 triphasic         +--------+------------------+-----+---------+--------+ +--------+------------------+-----+---------+-------+ Left    Lt Pressure (mmHg)IndexWaveform Comment +--------+------------------+-----+---------+-------+ Brachial98                     triphasic        +--------+------------------+-----+---------+-------+ PTA     109               1.11 triphasic        +--------+------------------+-----+---------+-------+ DP      106               1.08 triphasic        +--------+------------------+-----+---------+-------+  Right Doppler Findings: +--------+--------+-----+---------+--------+ Site    PressureIndexDoppler  Comments +--------+--------+-----+---------+--------+ [id number]           triphasic         +--------+--------+-----+---------+--------+ Radial               triphasic         +--------+--------+-----+---------+--------+ Ulnar                triphasic         +--------+--------+-----+---------+--------+  Left Doppler Findings: +--------+--------+-----+---------+--------+ Site    PressureIndexDoppler  Comments +--------+--------+-----+---------+--------+ [id number]           triphasic         +--------+--------+-----+---------+--------+ Radial               triphasic         +--------+--------+-----+---------+--------+ Ulnar                triphasic         +--------+--------+-----+---------+--------+  Summary: Right Carotid: Velocities in the right ICA are consistent with a 1-39% stenosis. Left Carotid: Velocities in the left ICA are consistent with a 1-39% stenosis. Vertebrals:  Bilateral vertebral arteries demonstrate antegrade flow. Subclavians: Normal flow hemodynamics were seen in bilateral subclavian               arteries. Right ABI: Resting right ankle-brachial index is within normal range. No evidence of significant right lower extremity arterial disease. Left ABI: Resting left ankle-brachial index is within normal range. No evidence of significant left lower extremity arterial disease. Right Upper Extremity: Doppler waveforms remain within normal limits with right radial compression. Doppler waveforms remain within normal limits with right ulnar compression. Left Upper Extremity: Doppler waveform obliterate with left radial compression. Doppler waveforms remain within normal limits with left ulnar compression.  Electronically signed by Heath Lark on 10/30/2020 at 5:06:47 PM.    Final     Cardiac database: Cardiac Studies:  Cardiac MRI 10/30/2020: 1. Severe LV enlargement with severe global hypokinesis with regional variation. EF 14%. 2. Small apical LV thrombus. 3. Normal RV size with mildly decreased systolic function, EF 36%. 4. Moderate mitral regurgitation. 5. Based on severe LV dysfunction, there was less late gadolinium enhancement than I would have expected. With the exception of the apical septal wall and the true apex, would expect the remainder of the LV myocardium to be viable.   Echocardiogram 10/29/2020: 1. Left ventricular ejection fraction, by estimation, is <20%. The left  ventricle has severely decreased function. The left ventricle demonstrates  global hypokinesis. The  left ventricular internal cavity size was mildly  dilated. Left ventricular  diastolic parameters are consistent with Grade II diastolic dysfunction  (pseudonormalization). Small LV apical thrombus seen on Definity contrast  study.  2. Right ventricular systolic function is mildly reduced. The right  ventricular size is normal. There is normal pulmonary artery systolic  pressure.  3. Left atrial size was mildly dilated.  4. Right atrial size was mildly dilated.  5. Mild thciekning of mitral valve  leaflets. Moderate mitral valve  regurgitation.   Coronary angiography 10/29/2020: LM: Long vessel. Normal LAD: Prox 90% stenosis, followed by flush occlusion after Diag 1 No antegrade flow seen in LAD Faint collaterals from RPL fill small caliber LAD Diag 1 with prox tandem 95% stenoses LCx: Prox tandem 95% stenoses Prox OM1 60% stenosis Mid LCx 60% stenosis RCA: Prox 90% stenosis, followed by 100% occlusion Bridging collaterals faintly fill mid RCA, and reconstitute distal RCA Prox RPDA 70% stenosis, followed by 50 stenosis  PA: 39/14 mmHg, mean PAP 23 mmHg PCW: 6 mmHg  Compensated ischemic cardiomyopathy   EKG5/07/2020: Sinus rhythm, LVH Inferolateral T wave inversion, consider ischemia  Scheduled Meds: . aspirin EC  81 mg Oral Daily  . atorvastatin  80 mg Oral Daily  . dapagliflozin propanediol  10 mg Oral Daily  . digoxin  0.125 mg Oral Daily  . ivabradine  5 mg Oral BID WC  . losartan  12.5 mg Oral Daily  . sodium chloride flush  3 mL Intravenous Q12H  . sodium chloride flush  3 mL Intravenous Q12H  . spironolactone  12.5 mg Oral Daily    Continuous Infusions: . sodium chloride    . sodium chloride    . heparin 1,150 Units/hr (10/31/20 1749)    PRN Meds: sodium chloride, sodium chloride, acetaminophen, ipratropium-albuterol, ondansetron (ZOFRAN) IV, sodium chloride flush, sodium chloride flush   IMPRESSION & RECOMMENDATIONS: Letha Capeatrick Weimann is a 70 y.o. male whose past medical history and cardiac risk factors include: h/o anemia 2018, possibleseizure6/2021, pure hypercholesterolemia, newly discovered ischemic cardiomyopathy, multivessel CAD.  Multivessel CAD: Patient is tentatively scheduled for CABG Tuesday, Nov 05, 2020. Both echocardiogram and cardiac MRI notes small LV apical thrombus. Currently chest pain-free on IV Heparin for CAD and LV thombus.  Viability evaluated on the most recent cardiac MRI  results noted above. Telemetry reviewed  Acute heart failure with reduced EF, stage C, NYHA class II/III: Patient is not symptomatic at rest but experiences mild dyspnea with ambulation Currently uptitrating guideline directed medical therapy. Appreciate advanced heart failure guidance Farxiga initiated today by advanced heart failure team. Check BMP in the morning  Ischemic cardiomyopathy: See above  LV thrombus: Currently on IV heparin drip.  Plans to transition to Coumadin as outpatient  Pure hypercholesterolemia: Most recent lipid profile reviewed.  Currently on statin therapy.  May consider PCSK9 inhibitors as outpatient if additional pharmacological therapy is warranted  Patient's questions and concerns were addressed to his satisfaction. He voices understanding of the instructions provided during this encounter.   This note was created using a voice recognition software as a result there may be grammatical errors inadvertently enclosed that do not reflect the nature of this encounter. Every attempt is made to correct such errors.  Delilah ShanSunit Carlei Huang, DO, Cho[facility] Regional Medical CenterFACC  Pager: [phone] Office: [phone] 11/01/2020, 9:10 AM

## 2020-11-01 NOTE — Care Management Important Message (Signed)
Important Message  Patient Details  Name: Troy Johnson MRN: [id number] Date of Birth: 1951/01/06   Medicare Important Message Given:  Yes     Renie Ora 11/01/2020, 10:10 AM

## 2020-11-01 NOTE — Care Management (Signed)
1216 11-01-20 Benefits check submitted for Farxiga. Case Manager will follow for cost. Graves-Bigelow, Lamar Laundry, RN,BSN Case Manager

## 2020-11-01 NOTE — TOC Benefit Eligibility Note (Signed)
Transition of Care [facility]) Benefit Eligibility Note    Patient Details  Name: Deshane Cotroneo MRN: [id number] Date of Birth: 1951-05-14   Medication/Dose: Marcelline Deist 10mg  qd        Prescription Coverage [facility]: [facility]  Spoke with Person/Company/Phone Number:: [facility]     Prior Approval: Yes [id number])     Additional Notes: could not give co-pay without PA    [phone] Phone Number: 11/01/2020, 12:36 PM

## 2020-11-01 NOTE — Progress Notes (Signed)
ANTICOAGULATION CONSULT NOTE - Follow Up Consult  Pharmacy Consult for heparin Indication: apical thrombus  Allergies  Allergen Reactions  . Penicillins Other (See Comments)    childhood    Patient Measurements: Height: 6\' 1"  (185.4 cm) Weight: 69.4 kg (153 lb 1.6 oz) IBW/kg (Calculated) : 79.9 Heparin Dosing Weight: 77kg  Vital Signs: Temp: 98.6 F (37 C) (05/06 0500) Temp Source: Oral (05/06 0500) BP: 108/73 (05/06 0500) Pulse Rate: 82 (05/06 0500)  Labs: Recent Labs    10/30/20 0248 10/30/20 1011 10/31/20 0429 10/31/20 1618 11/01/20 0242  HGB 12.4*  --  12.0*  --  11.5*  HCT 36.1*  --  35.0*  --  33.3*  PLT 275  --  246  --  228  HEPARINUNFRC 0.29*   < > 0.92* 0.83* 0.65  CREATININE 1.32*  --  1.29*  --  1.36*   < > = values in this interval not displayed.    Estimated Creatinine Clearance: 50.3 mL/min (A) (by C-G formula based on SCr of 1.36 mg/dL (H)).   Medical History: Past Medical History:  Diagnosis Date  . Anemia   . Asthma    childhood  . Blood in stool   . Childhood asthma   . GI bleed   . Kidney laceration 1999  . Seizures (HCC)    Assessment: 70 yoM admitted with SOB found to have mvCAD and apical thrombus. Pt started on IV heparin, no AC PTA.  Heparin level is within goal range at 0.65, on 1150 units/hr. Hgb 11.5, plt 228. No s/sx of bleeding or infusion issues.   Goal of Therapy:  Heparin level 0.3-0.7 units/ml Monitor platelets by anticoagulation protocol: Yes   Plan:  Continue heparin infusion at 1150 units/hr Daily heparin level and CBC  78, PharmD, BCCCP Clinical Pharmacist  Phone: [phone] 11/01/2020 12:21 PM  Please check AMION for all [facility] Pharmacy phone numbers After 10:00 PM, call Main Pharmacy [phone]

## 2020-11-01 NOTE — Progress Notes (Signed)
Initial Nutrition Assessment  DOCUMENTATION CODES:  Non-severe (moderate) malnutrition in context of chronic illness  INTERVENTION:  Continue current diet order.  Add Ensure Enlive po once daily, each supplement provides 350 kcal and 20 grams of protein.  Add Magic cup TID with meals, each supplement provides 290 kcal and 9 grams of protein.  Add 30 ml ProSource Plus po BID, each supplement provides 100 kcal and 15 grams of protein.   Add MVI with minerals daily.  NUTRITION DIAGNOSIS:  Moderate Malnutrition related to chronic illness as evidenced by mild fat depletion,moderate fat depletion,mild muscle depletion,moderate muscle depletion.  GOAL:  Patient will meet greater than or equal to 90% of their needs  MONITOR:  PO intake,Supplement acceptance,Weight trends,I & O's  REASON FOR ASSESSMENT:  Consult Assessment of nutrition requirement/status  ASSESSMENT:  70 yo male with a PMH of HTN who presents with acute systolic CHF, CAD, HLD, and LV thrombus. Plan for CABG on 5/10.  Spoke with pt at bedside. Pt in good spirits. He reports that he understands the seriousness of his condition and his niece is going to help him shop and cook for a low sodium diet. He is prepared to change, but still enjoy his favorite fried foods in moderation. Before coming to the hospital, pt reports eating a lot of fried foods and high sodium foods. He reports eating well at home.  Pt reports a 2 lb weight gain recently, but this may be fluid given CHF diagnosis. Per Epic, pt has lost 17 lbs (10%) over the past year.   On exam, pt had some mild/moderate depletions in his face and clavicle, but has good muscle and fat stores in the rest of his body. Pt meets criteria for moderate malnutrition chronically.  Recommend Ensure once daily and Magic Cup TID to help with caloric/protein intake. Pt also interested in trying ProSource BID.   Medications: spironolactone Labs: reviewed  NUTRITION - FOCUSED  PHYSICAL EXAM: Flowsheet Row Most Recent Value  Orbital Region Moderate depletion  Upper Arm Region No depletion  Thoracic and Lumbar Region No depletion  Buccal Region Mild depletion  Temple Region Moderate depletion  Clavicle Bone Region Moderate depletion  Clavicle and Acromion Bone Region Mild depletion  Scapular Bone Region No depletion  Dorsal Hand No depletion  Patellar Region No depletion  Anterior Thigh Region No depletion  Posterior Calf Region No depletion  Edema (RD Assessment) None  Hair Reviewed  Eyes Reviewed  Mouth Reviewed  Skin Reviewed  Nails Reviewed     Diet Order:   Diet Order            Diet Heart Room service appropriate? Yes; Fluid consistency: Thin  Diet effective now                EDUCATION NEEDS:  Education needs have been addressed  Skin:  Skin Assessment: Reviewed RN Assessment  Last BM:  10/30/20  Height:  Ht Readings from Last 1 Encounters:  10/28/20 6\' 1"  (1.854 m)   Weight:  Wt Readings from Last 1 Encounters:  11/01/20 69.4 kg   Ideal Body Weight:  83.6 kg  BMI:  Body mass index is 20.2 kg/m.  Estimated Nutritional Needs:  Kcal:  1850-2050 Protein:  95-110 grams Fluid:  2 L  01/01/21, RD, LDN Registered Dietitian After Hours/Weekend Pager # in [place]

## 2020-11-01 NOTE — Progress Notes (Signed)
PROGRESS NOTE    Troy Johnson  WGN:[id number] DOB: [birth year] DOA: 10/28/2020 PCP: [name], MD    Chief Complaint  Patient presents with  . Shortness of Breath    Brief Narrative:  70 year old male with medical history of hypertension presents with shortness of breath.  Patient states that last Friday he started having shortness of breath initially had exertional dyspnea then developed orthopnea for past 2 nights.  Initial troponin was 656, potassium 5.9, chest x-ray showed cardiomegaly and pulmonary vascular congestion.  EKG showed ST depression T wave inversion V5 V6.  Cardiology was consulted, patient received 1 dose of Lasix with improvement in symptoms.  Heparin drip was started in the ED.  Patient went for cardiac catheterization which showed multivessel disease.  cardiothoracic surgery was consulted for CABG. Cardiac MRI ordered for evaluation of viability. PCCM consulted for assistance in pre op evaluation and care as pt will be high risk.   Pt seen and examined at bedside he denies any chest pain or shortness of breath at rest.  Patient is aware of the plan and denies any questions at this time.   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  - PCCM consulted for assistance in pre op evaluation and care.   Acute decompensated CHF:  Resolved He appears compensated at this time. He denies any sob or chest pain.  Continue with digoxin, cozaar and spironolactone added to his regimen.  Continue with strict intake and output.  Patient started on Farxiga this morning.   Acute coronary syndrome:  - elevated troponin s secondary to demand ischemia.  - continue with IV heparin and aspirin.  - pt currently denies  any chest pain.    Hyperlipidemia:  Continue with lipitor 80 mg daily.      DVT prophylaxis: HEPARIN.  Code Status: FULL CODE.  Family Communication: none at bedside.  Disposition:   Status is: Inpatient  Remains inpatient appropriate because:Ongoing diagnostic testing needed not appropriate for outpatient work up and IV treatments appropriate due to intensity of illness or inability to take PO   Dispo: The patient is from: Home              Anticipated d/c is to: Home              Patient currently is not medically stable to d/c.   Difficult to place patient No       Consultants:   CARDIOLOGY  Cardiothoracic surgery.   PCCM.    Procedures: cardiac MRI     Antimicrobials: none.    Subjective: No new complaints today  Objective: Vitals:   10/31/20 0512 10/31/20 1641 10/31/20 2013 11/01/20 0500  BP:  102/75 95/64 108/73  Pulse:  73 69 82  Resp:  18  18  Temp:  98.1 F (36.7 C) (!) 97.1 F (36.2 C) 98.6 F (37 C)  TempSrc:  Oral Oral Oral  SpO2:  99% 99% 98%  Weight: 68.5 kg   69.4 kg  Height:        Intake/Output Summary (Last 24 hours) at 11/01/2020 1131 Last data filed at 10/31/2020 2015 Gross per 24 hour  Intake 258 ml  Output --  Net 258 ml   Filed Weights   10/30/20  7829 10/31/20 0512 11/01/20 0500  Weight: 68.9 kg 68.5 kg 69.4 kg    Examination:  General exam: Alert and comfortable not in any kind of distress Respiratory system: Clear to auscultation bilaterally, no wheezing or rhonchi Cardiovascular system: S1-S2 heard, regular rate rhythm no pedal edema Gastrointestinal system: Abdomen is soft, nontender, nondistended, bowel sounds normal Central nervous system: Alert and oriented, grossly nonfocal Extremities: No pedal edema Skin: No rashes seen  psychiatry: Mood is appropriate    Data Reviewed: I have personally reviewed following labs and imaging studies  CBC: Recent Labs  Lab 10/28/20 1433 10/29/20 0251  10/29/20 0806 10/29/20 0808 10/30/20 0248 10/31/20 0429 11/01/20 0242  WBC 4.7 5.1  --   --  4.6 4.9 4.2  NEUTROABS 2.3  --   --   --   --   --   --   HGB 13.1 12.8* 12.6* 12.9* 12.4* 12.0* 11.5*  HCT 39.0 37.2* 37.0* 38.0* 36.1* 35.0* 33.3*  MCV 97.0 94.7  --   --  95.3 95.6 95.4  PLT 272 289  --   --  275 246 228    Basic Metabolic Panel: Recent Labs  Lab 10/28/20 1619 10/29/20 0251 10/29/20 0806 10/29/20 0808 10/30/20 0248 10/31/20 0429 11/01/20 0242  NA 139 137 139 140 138 138 136  K 4.2 3.7 3.5 3.7 3.7 4.0 3.9  CL 108 104  --   --  105 106 106  CO2 20* 23  --   --  23 24 23   GLUCOSE 92 98  --   --  99 92 85  BUN 17 18  --   --  26* 25* 24*  CREATININE 1.21 1.31*  --   --  1.32* 1.29* 1.36*  CALCIUM 9.4 9.1  --   --  9.2 9.2 9.1    GFR: Estimated Creatinine Clearance: 50.3 mL/min (A) (by C-G formula based on SCr of 1.36 mg/dL (H)).  Liver Function Tests: No results for input(s): AST, ALT, ALKPHOS, BILITOT, PROT, ALBUMIN in the last 168 hours.  CBG: No results for input(s): GLUCAP in the last 168 hours.   Recent Results (from the past 240 hour(s))  Resp Panel by RT-PCR (Flu A&B, Covid) Nasopharyngeal Swab     Status: None   Collection Time: 10/28/20  2:50 PM   Specimen: Nasopharyngeal Swab; Nasopharyngeal(NP) swabs in vial transport medium  Result Value Ref Range Status   SARS Coronavirus 2 by RT PCR NEGATIVE NEGATIVE Final    Comment: (NOTE) SARS-CoV-2 target nucleic acids are NOT DETECTED.  The SARS-CoV-2 RNA is generally detectable in upper respiratory specimens during the acute phase of infection. The lowest concentration of SARS-CoV-2 viral copies this assay can detect is 138 copies/mL. A negative result does not preclude SARS-Cov-2 infection and should not be used as the sole basis for treatment or other patient management decisions. A negative result may occur with  improper specimen collection/handling, submission of specimen other than  nasopharyngeal swab, presence of viral mutation(s) within the areas targeted by this assay, and inadequate number of viral copies(<138 copies/mL). A negative result must be combined with clinical observations, patient history, and epidemiological information. The expected result is Negative.  Fact Sheet for Patients:  12/28/20  Fact Sheet for Healthcare Providers:  BloggerCourse.com  This test is no t yet approved or cleared by the [place] FDA and  has been authorized for detection and/or diagnosis of SARS-CoV-2 by FDA under an Emergency Use Authorization (EUA). This EUA will remain  in effect (meaning this test can be used) for the duration of the COVID-19 declaration under Section 564(b)(1) of the Act, 21 U.S.C.section 360bbb-3(b)(1), unless the authorization is terminated  or revoked sooner.       Influenza A by PCR NEGATIVE NEGATIVE Final   Influenza B by PCR NEGATIVE NEGATIVE Final    Comment: (NOTE) The Xpert Xpress SARS-CoV-2/FLU/RSV plus assay is intended as an aid in the diagnosis of influenza from Nasopharyngeal swab specimens and should not be used as a sole basis for treatment. Nasal washings and aspirates are unacceptable for Xpert Xpress SARS-CoV-2/FLU/RSV testing.  Fact Sheet for Patients: BloggerCourse.comhttps://www.fda.gov/media/152166/download  Fact Sheet for Healthcare Providers: SeriousBroker.ithttps://www.fda.gov/media/152162/download  This test is not yet approved or cleared by the [place] FDA and has been authorized for detection and/or diagnosis of SARS-CoV-2 by FDA under an Emergency Use Authorization (EUA). This EUA will remain in effect (meaning this test can be used) for the duration of the COVID-19 declaration under Section 564(b)(1) of the Act, 21 U.S.C. section 360bbb-3(b)(1), unless the authorization is terminated or revoked.  Performed at [facility], [address] [place], [place] [address]    Surgical pcr screen     Status: None   Collection Time: 10/29/20  5:19 PM   Specimen: Nasal Mucosa; Nasal Swab  Result Value Ref Range Status   MRSA, PCR NEGATIVE NEGATIVE Final   Staphylococcus aureus NEGATIVE NEGATIVE Final    Comment: (NOTE) The Xpert SA Assay (FDA approved for NASAL specimens in patients 70 years of age and older), is one component of a comprehensive surveillance program. It is not intended to diagnose infection nor to guide or monitor treatment. Performed at [facility], [address] [place], [place] [address]          Radiology Studies: MR CARDIAC MORPHOLOGY W WO CONTRAST  Result Date: 10/30/2020 CLINICAL DATA:  Ischemic cardiomyopathy, viability EXAM: CARDIAC MRI TECHNIQUE: The patient was scanned on a 1.5 Tesla GE magnet. A dedicated cardiac coil was used. Functional imaging was done using Fiesta sequences. 2,3, and 4 chamber views were done to assess for RWMA's. Modified Simpson's rule using a short axis stack was used to calculate an ejection fraction on a dedicated work Research officer, trade unionstation using Circle software. The patient received 8 cc of Gadavist. After 10 minutes inversion recovery sequences were used to assess for infiltration and scar tissue. FINDINGS: There is a small right pleural effusion. Atelectasis right lung base. Trivial pericardial effusion. The left ventricle was severely dilated. Thin inferolateral and apex. Global severe hypokinesis with apical, inferior, and inferolateral akinesis. LV EF calculated as 14%. There was a small thrombus at the LV apex. Normal right ventricular size with mildly decreased systolic function, EF 36%. Moderate left atrial enlargement. Normal right atrial size. Trileaflet aortic valve with no significant stenosis or regurgitation. Mitral regurgitation appears moderate visually. Delayed enhancement imaging: Small area of full thickness late gadolinium enhancement (LGE) mid inferolateral wall, but most of inferolateral wall  has no significant LGE. 76-99% wall thickness subendocardial LGE in the apical septal wall and at the true apex. Measurements: LVEDV 366 mL LVSV 50 mL LVEF 14% RVEDV 157 mL RVSV 56 mL RVEF 36% IMPRESSION: 1. Severe LV enlargement with severe global hypokinesis with regional variation. EF 14%. 2.  Small apical LV thrombus. 3.  Normal RV size with mildly decreased systolic function, EF 36%. 4.  Moderate mitral regurgitation. 5. Based on severe LV dysfunction, there was less late gadolinium enhancement than I would  have expected. With the exception of the apical septal wall and the true apex, would expect the remainder of the LV myocardium to be viable. Dalton Mclean Electronically Signed   By: Marca Ancona M.D.   On: 10/30/2020 20:23   VAS US DOPPLER PRE CABG  Result Date: 10/30/2020 PREOPERATIVE VASCULAR EVALUATION Patient Name:  Troy Johnson  Date of Exam:   10/30/2020 Medical Rec #: [id number]       Accession #:    [id number] Date of Birth: 1951-02-21      Patient Gender: M Patient Age:   69Y Exam Location:  [facility] Procedure:      VAS US DOPPLER PRE CABG Referring Phys: [id number] HARRELL O LIGHTFOOT --------------------------------------------------------------------------------  Indications:      Pre-CABG. Risk Factors:     Past history of smoking, prior MI, coronary artery disease. Comparison Study: No prior studies. Performing Technologist: Ernestene Mention RVT, RDMS Supporting Technologist: Jean Rosenthal RDMS,RVT  Examination Guidelines: A complete evaluation includes B-mode imaging, spectral Doppler, color Doppler, and power Doppler as needed of all accessible portions of each vessel. Bilateral testing is considered an integral part of a complete examination. Limited examinations for reoccurring indications may be performed as noted.  Right Carotid Findings: +----------+--------+--------+--------+------------+------------------+           PSV cm/sEDV cm/sStenosisDescribe    Comments            +----------+--------+--------+--------+------------+------------------+ CCA Prox  86      24                          intimal thickening +----------+--------+--------+--------+------------+------------------+ CCA Distal85      21              heterogenous                   +----------+--------+--------+--------+------------+------------------+ ICA Prox  91      26      1-39%   heterogenous                   +----------+--------+--------+--------+------------+------------------+ ICA Distal83      36                                             +----------+--------+--------+--------+------------+------------------+ ECA       80      12                                             +----------+--------+--------+--------+------------+------------------+ Portions of this table do not appear on this page. +----------+--------+-------+----------------+------------+           PSV cm/sEDV cmsDescribe        Arm Pressure +----------+--------+-------+----------------+------------+ Subclavian96             Multiphasic, WNL             +----------+--------+-------+----------------+------------+ +---------+--------+--+--------+--+---------+ VertebralPSV cm/s51EDV cm/s18Antegrade +---------+--------+--+--------+--+---------+ Left Carotid Findings: +----------+--------+--------+--------+------------+--------+           PSV cm/sEDV cm/sStenosisDescribe    Comments +----------+--------+--------+--------+------------+--------+ CCA Prox  103     31                                   +----------+--------+--------+--------+------------+--------+  CCA Distal98      23              heterogenous         +----------+--------+--------+--------+------------+--------+ ICA Prox  87      31      1-39%   heterogenous         +----------+--------+--------+--------+------------+--------+ ICA Distal65      26                                    +----------+--------+--------+--------+------------+--------+ ECA       89                      heterogenous         +----------+--------+--------+--------+------------+--------+ +----------+--------+--------+----------------+------------+ SubclavianPSV cm/sEDV cm/sDescribe        Arm Pressure +----------+--------+--------+----------------+------------+           88              Multiphasic, WNL             +----------+--------+--------+----------------+------------+ +---------+--------+--+--------+--+---------+ VertebralPSV cm/s27EDV cm/s10Antegrade +---------+--------+--+--------+--+---------+  ABI Findings: +--------+------------------+-----+---------+--------+ Right   Rt Pressure (mmHg)IndexWaveform Comment  +--------+------------------+-----+---------+--------+ [id number]                     triphasic         +--------+------------------+-----+---------+--------+ PTA     99                1.01 triphasic         +--------+------------------+-----+---------+--------+ DP      111               1.13 triphasic         +--------+------------------+-----+---------+--------+ +--------+------------------+-----+---------+-------+ Left    Lt Pressure (mmHg)IndexWaveform Comment +--------+------------------+-----+---------+-------+ [id number]                     triphasic        +--------+------------------+-----+---------+-------+ PTA     109               1.11 triphasic        +--------+------------------+-----+---------+-------+ DP      106               1.08 triphasic        +--------+------------------+-----+---------+-------+  Right Doppler Findings: +--------+--------+-----+---------+--------+ Site    PressureIndexDoppler  Comments +--------+--------+-----+---------+--------+ [id number]           triphasic         +--------+--------+-----+---------+--------+ Radial               triphasic          +--------+--------+-----+---------+--------+ Ulnar                triphasic         +--------+--------+-----+---------+--------+  Left Doppler Findings: +--------+--------+-----+---------+--------+ Site    PressureIndexDoppler  Comments +--------+--------+-----+---------+--------+ [id number]           triphasic         +--------+--------+-----+---------+--------+ Radial               triphasic         +--------+--------+-----+---------+--------+ Ulnar                triphasic         +--------+--------+-----+---------+--------+  Summary: Right Carotid: Velocities in the right ICA are consistent with a 1-39% stenosis. Left Carotid: Velocities in the  left ICA are consistent with a 1-39% stenosis. Vertebrals:  Bilateral vertebral arteries demonstrate antegrade flow. Subclavians: Normal flow hemodynamics were seen in bilateral subclavian              arteries. Right ABI: Resting right ankle-brachial index is within normal range. No evidence of significant right lower extremity arterial disease. Left ABI: Resting left ankle-brachial index is within normal range. No evidence of significant left lower extremity arterial disease. Right Upper Extremity: Doppler waveforms remain within normal limits with right radial compression. Doppler waveforms remain within normal limits with right ulnar compression. Left Upper Extremity: Doppler waveform obliterate with left radial compression. Doppler waveforms remain within normal limits with left ulnar compression.  Electronically signed by Heath Lark on 10/30/2020 at 5:06:47 PM.    Final         Scheduled Meds: . aspirin EC  81 mg Oral Daily  . atorvastatin  80 mg Oral Daily  . dapagliflozin propanediol  10 mg Oral Daily  . digoxin  0.125 mg Oral Daily  . ivabradine  5 mg Oral BID WC  . losartan  12.5 mg Oral Daily  . sodium chloride flush  3 mL Intravenous Q12H  . sodium chloride flush  3 mL Intravenous Q12H  . spironolactone  12.5 mg  Oral Daily   Continuous Infusions: . sodium chloride    . sodium chloride    . heparin 1,150 Units/hr (10/31/20 1749)     LOS: 4 days        Kathlen Mody, MD [facility]   To contact the attending provider between 7A-7P or the covering provider during after hours 7P-7A, please log into the web site [email or web address] and access using universal [place] password for that web site. If you do not have the password, please call the hospital operator.  11/01/2020, 11:31 AM

## 2020-11-01 NOTE — Progress Notes (Signed)
Patient ID: Troy Johnson, male   DOB: 1950/07/19, 70 y.o.   MRN: [id number]     Advanced Heart Failure Rounding Note  PCP-Cardiologist: None   Subjective:    No chest pain, mild dyspnea noted with walking.   Cardiac MRI:  1. Severe LV enlargement with severe global hypokinesis with regional variation. EF 14%. 2.  Small apical LV thrombus. 3.  Normal RV size with mildly decreased systolic function, EF 36%. 4.  Moderate mitral regurgitation. 5. Based on severe LV dysfunction, there was less late gadolinium enhancement than I would have expected. With the exception of the apical septal wall and the true apex, would expect the remainder of the LV myocardium to be viable.   Objective:   Weight Range: 69.4 kg Body mass index is 20.2 kg/m.   Vital Signs:   Temp:  [97.1 F (36.2 C)-98.6 F (37 C)] 98.6 F (37 C) (05/06 0500) Pulse Rate:  [69-82] 82 (05/06 0500) Resp:  [18] 18 (05/06 0500) BP: (95-108)/(64-75) 108/73 (05/06 0500) SpO2:  [98 %-99 %] 98 % (05/06 0500) Weight:  [69.4 kg] 69.4 kg (05/06 0500) Last BM Date: 10/30/20  Weight change: Filed Weights   10/30/20 0531 10/31/20 0512 11/01/20 0500  Weight: 68.9 kg 68.5 kg 69.4 kg    Intake/Output:   Intake/Output Summary (Last 24 hours) at 11/01/2020 0850 Last data filed at 10/31/2020 2015 Gross per 24 hour  Intake 258 ml  Output --  Net 258 ml      Physical Exam    General: NAD Neck: JVP 7-8 cm, no thyromegaly or thyroid nodule.  Lungs: Clear to auscultation bilaterally with normal respiratory effort. CV: Nondisplaced PMI.  Heart regular S1/S2, no S3/S4, no murmur.  No peripheral edema.   Abdomen: Soft, nontender, no hepatosplenomegaly, no distention.  Skin: Intact without lesions or rashes.  Neurologic: Alert and oriented x 3.  Psych: Normal affect. Extremities: No clubbing or cyanosis.  HEENT: Normal.    Telemetry   NSR with rare PVCs (personally reviewed)  Labs    CBC Recent Labs     10/31/20 0429 11/01/20 0242  WBC 4.9 4.2  HGB 12.0* 11.5*  HCT 35.0* 33.3*  MCV 95.6 95.4  PLT 246 228   Basic Metabolic Panel Recent Labs    01/77/93 0429 11/01/20 0242  NA 138 136  K 4.0 3.9  CL 106 106  CO2 24 23  GLUCOSE 92 85  BUN 25* 24*  CREATININE 1.29* 1.36*  CALCIUM 9.2 9.1   Liver Function Tests No results for input(s): AST, ALT, ALKPHOS, BILITOT, PROT, ALBUMIN in the last 72 hours. No results for input(s): LIPASE, AMYLASE in the last 72 hours. Cardiac Enzymes No results for input(s): CKTOTAL, CKMB, CKMBINDEX, TROPONINI in the last 72 hours.  BNP: BNP (last 3 results) Recent Labs    10/28/20 1433  BNP 1,827.5*    ProBNP (last 3 results) No results for input(s): PROBNP in the last 8760 hours.   D-Dimer No results for input(s): DDIMER in the last 72 hours. Hemoglobin A1C No results for input(s): HGBA1C in the last 72 hours. Fasting Lipid Panel Recent Labs    10/30/20 0248  CHOL 257*  HDL 40*  LDLCALC 204*  TRIG 63  CHOLHDL 6.4   Thyroid Function Tests No results for input(s): TSH, T4TOTAL, T3FREE, THYROIDAB in the last 72 hours.  Invalid input(s): FREET3  Other results:   Imaging    No results found.   Medications:     Scheduled Medications: .  aspirin EC  81 mg Oral Daily  . atorvastatin  80 mg Oral Daily  . dapagliflozin propanediol  10 mg Oral Daily  . digoxin  0.125 mg Oral Daily  . ivabradine  5 mg Oral BID WC  . losartan  12.5 mg Oral Daily  . sodium chloride flush  3 mL Intravenous Q12H  . sodium chloride flush  3 mL Intravenous Q12H  . spironolactone  12.5 mg Oral Daily    Infusions: . sodium chloride    . sodium chloride    . heparin 1,150 Units/hr (10/31/20 1749)    PRN Medications: sodium chloride, sodium chloride, acetaminophen, ipratropium-albuterol, ondansetron (ZOFRAN) IV, sodium chloride flush, sodium chloride flush   Assessment/Plan   1. CAD: No chest pain, presented with dyspnea.  NSTEMI with  HS-TnI elevated to peak 746.  Cath showed severe multivessel CAD with reasonable targets for CABG.  However, EF < 20%.  Cardiac MRI suggested extensive viability (except for apical septal wall and true apex).  Think he is a CABG candidate but will be high risk.  - ASA 81 + atorvastatin 80 mg daily.  - High risk CABG, plan for next Tuesday with Dr. Cliffton Asters.  Would consider Impella 5.5 placement for post-operative period.  2. Acute systolic CHF: Ischemic cardiomyopathy.  Echo this admission with EF <20%, the septum and apex were akinetic, RV function looked low normal, moderate MR.  Cardiac MRI showed EF 14% with scar in the apical septal wall and apex, mildly decreased RV systolic function. Suspect significant viability as above.  He is not volume overloaded on exam and RHC showed normal filling pressures.  CI preserved on RHC at 2.5.  Mild dyspnea with walking.  - Continue Corlanor 5 mg bid, HR 70s.  - Continue digoxin 0.125 daily.  - Continue losartan 12.5 mg daily, would not increase pre-CABG.  - Continue spironolactone 12.5 mg daily.  - Can add dapagliflozin 10 mg daily for a little gentle diuresis.  - Would hope for improvement in function with revascularization.  3. LV thrombus: Small, noted on echo and cMRI.  - Heparin gtt for now, warfarin after surgery.  4. Mitral regurgitation: Likely functional.  Moderate.  5. Hyperlipidemia: Very high LDL.   - Atorvastatin 80 daily.  - May need Repatha in future.   Length of Stay: 4  Marca Ancona, MD  11/01/2020, 8:50 AM  Advanced Heart Failure Team Pager [phone] (M-F; 7a - 5p)  Please contact [facility] for night-coverage after hours (5p -7a ) and weekends on [email or web address]

## 2020-11-02 DIAGNOSIS — E440 Moderate protein-calorie malnutrition: Secondary | ICD-10-CM | POA: Diagnosis not present

## 2020-11-02 DIAGNOSIS — R0602 Shortness of breath: Secondary | ICD-10-CM

## 2020-11-02 LAB — BASIC METABOLIC PANEL
Anion gap: 9 (ref 5–15)
BUN: 20 mg/dL (ref 8–23)
CO2: 24 mmol/L (ref 22–32)
Calcium: 9.4 mg/dL (ref 8.9–10.3)
Chloride: 104 mmol/L (ref 98–111)
Creatinine, Ser: 1.32 mg/dL — ABNORMAL HIGH (ref 0.61–1.24)
GFR, Estimated: 58 mL/min — ABNORMAL LOW (ref >60)
Glucose, Bld: 92 mg/dL (ref 70–99)
Potassium: 4 mmol/L (ref 3.5–5.1)
Sodium: 137 mmol/L (ref 135–145)

## 2020-11-02 LAB — CBC
HCT: 34.6 % — ABNORMAL LOW (ref 39.0–52.0)
Hemoglobin: 11.8 g/dL — ABNORMAL LOW (ref 13.0–17.0)
MCH: 32.6 pg (ref 26.0–34.0)
MCHC: 34.1 g/dL (ref 30.0–36.0)
MCV: 95.6 fL (ref 80.0–100.0)
Platelets: 224 10*3/uL (ref 150–400)
RBC: 3.62 MIL/uL — ABNORMAL LOW (ref 4.22–5.81)
RDW: 14.8 % (ref 11.5–15.5)
WBC: 3.8 10*3/uL — ABNORMAL LOW (ref 4.0–10.5)
nRBC: 0 % (ref 0.0–0.2)

## 2020-11-02 LAB — HEPARIN LEVEL (UNFRACTIONATED): Heparin Unfractionated: 0.5 IU/mL (ref 0.30–0.70)

## 2020-11-02 LAB — BRAIN NATRIURETIC PEPTIDE: B Natriuretic Peptide: 1261.6 pg/mL — ABNORMAL HIGH (ref 0.0–100.0)

## 2020-11-02 NOTE — Progress Notes (Signed)
Progress Note  Patient Name: Troy Johnson Date of Encounter: 11/02/2020  Attending physician: Kathlen Mody, MD Primary care provider: [name], MD  Subjective: Troy Johnson is a 71 y.o. male who was seen and examined at bedside at approximately 132pm.  No events overnight. Denies chest pain at rest or with effort related activities Appears to be euvolemic. Soft blood pressures.  Objective: Vital Signs in the last 24 hours: Temp:  [97.7 F (36.5 C)-98.3 F (36.8 C)] 97.7 F (36.5 C) (05/07 1300) Pulse Rate:  [63-73] 63 (05/07 1300) Resp:  [18] 18 (05/07 1300) BP: (91-110)/(56-85) 91/56 (05/07 1300) SpO2:  [93 %-96 %] 96 % (05/07 1300)  Intake/Output:  Intake/Output Summary (Last 24 hours) at 11/02/2020 1352 Last data filed at 11/01/2020 2000 Gross per 24 hour  Intake 360 ml  Output --  Net 360 ml    Net IO Since Admission: 312.77 mL [11/02/20 1352]  Weights:  Filed Weights   10/30/20 0531 10/31/20 0512 11/01/20 0500  Weight: 68.9 kg 68.5 kg 69.4 kg    Telemetry: Personally reviewed.  Sinus rhythm with rare PVCs.  Physical examination: PHYSICAL EXAM: Vitals with BMI 11/02/2020 11/02/2020 11/01/2020  Height - - -  Weight - - -  BMI - - -  Systolic 91 109 110  Diastolic 56 85 74  Pulse 63 - 73    CONSTITUTIONAL: Well-developed and well-nourished. No acute distress.  SKIN: Skin is warm and dry. No rash noted. No cyanosis. No pallor. No jaundice HEAD: Normocephalic and atraumatic.  EYES: No scleral icterus MOUTH/THROAT: Moist oral membranes.  NECK: JVP 7-8 cm, No thyromegaly noted. No carotid bruits  LYMPHATIC: No visible cervical adenopathy.  CHEST Normal respiratory effort. No intercostal retractions  LUNGS: Clear to auscultation bilaterally.  No stridor. No wheezes. No rales.  CARDIOVASCULAR: Regular, positive S1-S2, no murmurs rubs or gallops appreciated ABDOMINAL: Soft, nontender, nondistended, no apparent ascites.  EXTREMITIES: No peripheral edema   HEMATOLOGIC: No significant bruising NEUROLOGIC: Oriented to person, place, and time. Nonfocal. Normal muscle tone.  PSYCHIATRIC: Normal mood and affect. Normal behavior. Cooperative  Lab Results: Hematology Recent Labs  Lab 10/31/20 0429 11/01/20 0242 11/02/20 0205  WBC 4.9 4.2 3.8*  RBC 3.66* 3.49* 3.62*  HGB 12.0* 11.5* 11.8*  HCT 35.0* 33.3* 34.6*  MCV 95.6 95.4 95.6  MCH 32.8 33.0 32.6  MCHC 34.3 34.5 34.1  RDW 15.0 15.0 14.8  PLT 246 228 224    Chemistry Recent Labs  Lab 10/31/20 0429 11/01/20 0242 11/02/20 0205  NA 138 136 137  K 4.0 3.9 4.0  CL 106 106 104  CO2 24 23 24   GLUCOSE 92 85 92  BUN 25* 24* 20  CREATININE 1.29* 1.36* 1.32*  CALCIUM 9.2 9.1 9.4  GFRNONAA >60 56* 58*  ANIONGAP 8 7 9      Cardiac Enzymes: Cardiac Panel (last 3 results) No results for input(s): CKTOTAL, CKMB, TROPONINIHS, RELINDX in the last 72 hours.  BNP (last 3 results) Recent Labs    10/28/20 1433 11/02/20 0205  BNP 1,827.5* 1,261.6*    ProBNP (last 3 results) No results for input(s): PROBNP in the last 8760 hours.   DDimer No results for input(s): DDIMER in the last 168 hours.   Hemoglobin A1c:  Lab Results  Component Value Date   HGBA1C 5.9 (H) 10/28/2020   MPG 122.63 10/28/2020    TSH No results for input(s): TSH in the last 8760 hours.  Lipid Panel     Component Value Date/Time  CHOL 257 (H) 10/30/2020 0248   TRIG 63 10/30/2020 0248   HDL 40 (L) 10/30/2020 0248   CHOLHDL 6.4 10/30/2020 0248   VLDL 13 10/30/2020 0248   LDLCALC 204 (H) 10/30/2020 0248    Imaging: No results found.  Cardiac database: Cardiac Studies:  Cardiac MRI 10/30/2020: 1. Severe LV enlargement with severe global hypokinesis with regional variation. EF 14%. 2. Small apical LV thrombus. 3. Normal RV size with mildly decreased systolic function, EF 36%. 4. Moderate mitral regurgitation. 5. Based on severe LV dysfunction, there was less late gadolinium enhancement than  I would have expected. With the exception of the apical septal wall and the true apex, would expect the remainder of the LV myocardium to be viable.   Echocardiogram 10/29/2020: 1. Left ventricular ejection fraction, by estimation, is <20%. The left  ventricle has severely decreased function. The left ventricle demonstrates  global hypokinesis. The left ventricular internal cavity size was mildly  dilated. Left ventricular  diastolic parameters are consistent with Grade II diastolic dysfunction  (pseudonormalization). Small LV apical thrombus seen on Definity contrast  study.  2. Right ventricular systolic function is mildly reduced. The right  ventricular size is normal. There is normal pulmonary artery systolic  pressure.  3. Left atrial size was mildly dilated.  4. Right atrial size was mildly dilated.  5. Mild thciekning of mitral valve leaflets. Moderate mitral valve  regurgitation.   Coronary angiography 10/29/2020: LM: Long vessel. Normal LAD: Prox 90% stenosis, followed by flush occlusion after Diag 1 No antegrade flow seen in LAD Faint collaterals from RPL fill small caliber LAD Diag 1 with prox tandem 95% stenoses LCx: Prox tandem 95% stenoses Prox OM1 60% stenosis Mid LCx 60% stenosis RCA: Prox 90% stenosis, followed by 100% occlusion Bridging collaterals faintly fill mid RCA, and reconstitute distal RCA Prox RPDA 70% stenosis, followed by 50 stenosis  PA: 39/14 mmHg, mean PAP 23 mmHg PCW: 6 mmHg  Compensated ischemic cardiomyopathy   EKG5/07/2020: Sinus rhythm, LVH Inferolateral T wave inversion, consider ischemia  Scheduled Meds: . (feeding supplement) PROSource Plus  30 mL Oral BID BM  . aspirin EC  81 mg Oral Daily  . atorvastatin  80 mg Oral Daily  . dapagliflozin propanediol  10 mg Oral Daily  . digoxin  0.125 mg Oral Daily  . feeding supplement  237 mL Oral Q24H  . ivabradine  5 mg Oral BID WC   . losartan  12.5 mg Oral Daily  . multivitamin with minerals  1 tablet Oral Daily  . sodium chloride flush  3 mL Intravenous Q12H  . sodium chloride flush  3 mL Intravenous Q12H  . spironolactone  12.5 mg Oral Daily    Continuous Infusions: . sodium chloride    . sodium chloride    . heparin 1,150 Units/hr (11/02/20 1251)    PRN Meds: sodium chloride, sodium chloride, acetaminophen, ipratropium-albuterol, ondansetron (ZOFRAN) IV, sodium chloride flush, sodium chloride flush   IMPRESSION & RECOMMENDATIONS: Koven Belinsky is a 70 y.o. male whose past medical history and cardiac risk factors include: h/o anemia 2018, possibleseizure6/2021, pure hypercholesterolemia, newly discovered ischemic cardiomyopathy, multivessel CAD.  Multivessel CAD: Patient is tentatively scheduled for CABG Tuesday, Nov 05, 2020. Both echocardiogram and cardiac MRI notes small LV apical thrombus. Currently chest pain-free on IV Heparin for CAD and LV thombus.  Viability evaluated on the most recent cardiac MRI results noted above. Telemetry reviewed. Recommend continue current medical therapy.  Blood pressures remain soft and therefore not uptitrating  her beta-blocker therapy as well as diuretics.  Acute heart failure with reduced EF, stage C, NYHA class II: Patient is not symptomatic at rest but experiences mild dyspnea with ambulation Uptitrating guideline directed medical therapy as hemodynamics and laboratory values allow. Patient's ventricular rate ranges between 62-73 bpm. SBP ranges between 91-110 BNP slightly improved. Strict I's and O's do not appear to be accurate. Kidney function relatively stable. Continue aspirin and statin therapy. Continue digoxin, Farxiga, Corlanor, losartan, spironolactone Appreciate advanced heart failure guidance   Ischemic cardiomyopathy: See above  LV thrombus: Currently on IV heparin drip.  Hemoglobin remained stable.  Plans to transition to Coumadin as  outpatient  Pure hypercholesterolemia: Most recent lipid profile reviewed.  Currently on statin therapy.  May consider PCSK9 inhibitors as outpatient if additional pharmacological therapy is warranted  Patient's questions and concerns were addressed to his satisfaction. He voices understanding of the instructions provided during this encounter.   This note was created using a voice recognition software as a result there may be grammatical errors inadvertently enclosed that do not reflect the nature of this encounter. Every attempt is made to correct such errors.  [facility]  Pager: [phone] Office: [phone] 11/02/2020, 1:52 PM

## 2020-11-02 NOTE — Progress Notes (Signed)
PROGRESS NOTE    Troy Johnson  FXJ:[id number] DOB: 1951-04-08 DOA: 10/28/2020 PCP: [name], MD    Chief Complaint  Patient presents with  . Shortness of Breath    Brief Narrative:  70 year old male with medical history of hypertension presents with shortness of breath.  Patient states that last Friday he started having shortness of breath initially had exertional dyspnea then developed orthopnea for past 2 nights.  Initial troponin was 656, potassium 5.9, chest x-ray showed cardiomegaly and pulmonary vascular congestion.  EKG showed ST depression T wave inversion V5 V6.  Cardiology was consulted, patient received 1 dose of Lasix with improvement in symptoms.  Heparin drip was started in the ED.  Patient went for cardiac catheterization which showed multivessel disease.  cardiothoracic surgery was consulted for CABG. Cardiac MRI ordered for evaluation of viability. PCCM consulted for assistance in pre op evaluation and care as pt will be high risk.  Pt seen and examined, no new complaints at this time.    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  - PCCM consulted for assistance in pre op evaluation and care.   Acute decompensated CHF:  Resolved He appears compensated at this time. He denies any sob or chest pain.  Continue with digoxin, cozaar and spironolactone added to his regimen.  Continue with strict intake and output.  Patient started on Farxiga. Check labs in am.    Acute coronary syndrome:  - elevated troponin s secondary to demand ischemia.  - continue with IV heparin and aspirin.  -no chest pain or sob.    Hypertension:  BP parameters are borderline low.     Malnutrition of moderate degree.  Nutrition will be consulted.    Hyperlipidemia:  Continue with lipitor 80 mg daily.  ldL is 204.    Stage 2 CKD:  Creatinine at baseline at 1.3   Mild anemia of chronic disease.    DVT prophylaxis: HEPARIN.  Code Status: FULL CODE.  Family Communication: none at bedside.  Disposition:   Status is: Inpatient  Remains inpatient appropriate because:Ongoing diagnostic testing needed not appropriate for outpatient work up and IV treatments appropriate due to intensity of illness or inability to take PO   Dispo: The patient is from: Home              Anticipated d/c is to: Home              Patient currently is not medically stable to d/c.   Difficult to place patient No       Consultants:   CARDIOLOGY  Cardiothoracic surgery.   PCCM.    Procedures: cardiac MRI     Antimicrobials: none.    Subjective: No chest pain or sob.   Objective: Vitals:   11/01/20 1926 11/01/20 2055 11/02/20 0618 11/02/20 1300  BP: 106/66 110/74 109/85 (!) 91/56  Pulse:  73  63  Resp:  18  18  Temp:  98.3 F (36.8 C)  97.7 F (36.5 C)  TempSrc:  Oral  Oral  SpO2:  93%  96%  Weight:      Height:        Intake/Output Summary (Last 24 hours) at 11/02/2020 1428 Last data  filed at 11/01/2020 2000 Gross per 24 hour  Intake 360 ml  Output --  Net 360 ml   Filed Weights   10/30/20 0531 10/31/20 0512 11/01/20 0500  Weight: 68.9 kg 68.5 kg 69.4 kg    Examination:  General exam: Alert sitting in the chair comfortable no distress noted Respiratory system: Air entry fair bilateral, no wheezing.  Cardiovascular system: S1S2, RRR, no JVD, no pedal edema.  Gastrointestinal system: Abdomen is soft, NT ND BS+ Central nervous system: Alert and oriented, non focal  Extremities: No pedal edema.  Skin: No rashes seen.  psychiatry: Mood is appropriate    Data Reviewed: I have personally reviewed following labs and imaging  studies  CBC: Recent Labs  Lab 10/28/20 1433 10/29/20 0251 10/29/20 0806 10/29/20 0808 10/30/20 0248 10/31/20 0429 11/01/20 0242 11/02/20 0205  WBC 4.7 5.1  --   --  4.6 4.9 4.2 3.8*  NEUTROABS 2.3  --   --   --   --   --   --   --   HGB 13.1 12.8*   < > 12.9* 12.4* 12.0* 11.5* 11.8*  HCT 39.0 37.2*   < > 38.0* 36.1* 35.0* 33.3* 34.6*  MCV 97.0 94.7  --   --  95.3 95.6 95.4 95.6  PLT 272 289  --   --  275 246 228 224   < > = values in this interval not displayed.    Basic Metabolic Panel: Recent Labs  Lab 10/29/20 0251 10/29/20 0806 10/29/20 0808 10/30/20 0248 10/31/20 0429 11/01/20 0242 11/02/20 0205  NA 137   < > 140 138 138 136 137  K 3.7   < > 3.7 3.7 4.0 3.9 4.0  CL 104  --   --  105 106 106 104  CO2 23  --   --  23 24 23 24   GLUCOSE 98  --   --  99 92 85 92  BUN 18  --   --  26* 25* 24* 20  CREATININE 1.31*  --   --  1.32* 1.29* 1.36* 1.32*  CALCIUM 9.1  --   --  9.2 9.2 9.1 9.4   < > = values in this interval not displayed.    GFR: Estimated Creatinine Clearance: 51.8 mL/min (A) (by C-G formula based on SCr of 1.32 mg/dL (H)).  Liver Function Tests: No results for input(s): AST, ALT, ALKPHOS, BILITOT, PROT, ALBUMIN in the last 168 hours.  CBG: No results for input(s): GLUCAP in the last 168 hours.   Recent Results (from the past 240 hour(s))  Resp Panel by RT-PCR (Flu A&B, Covid) Nasopharyngeal Swab     Status: None   Collection Time: 10/28/20  2:50 PM   Specimen: Nasopharyngeal Swab; Nasopharyngeal(NP) swabs in vial transport medium  Result Value Ref Range Status   SARS Coronavirus 2 by RT PCR NEGATIVE NEGATIVE Final    Comment: (NOTE) SARS-CoV-2 target nucleic acids are NOT DETECTED.  The SARS-CoV-2 RNA is generally detectable in upper respiratory specimens during the acute phase of infection. The lowest concentration of SARS-CoV-2 viral copies this assay can detect is 138 copies/mL. A negative result does not preclude SARS-Cov-2 infection  and should not be used as the sole basis for treatment or other patient management decisions. A negative result may occur with  improper specimen collection/handling, submission of specimen other than nasopharyngeal swab, presence of viral mutation(s) within the areas targeted by this assay, and inadequate number of viral copies(<138 copies/mL). A negative result  must be combined with clinical observations, patient history, and epidemiological information. The expected result is Negative.  Fact Sheet for Patients:  BloggerCourse.com  Fact Sheet for Healthcare Providers:  SeriousBroker.it  This test is no t yet approved or cleared by the [place] FDA and  has been authorized for detection and/or diagnosis of SARS-CoV-2 by FDA under an Emergency Use Authorization (EUA). This EUA will remain  in effect (meaning this test can be used) for the duration of the COVID-19 declaration under Section 564(b)(1) of the Act, 21 U.S.C.section 360bbb-3(b)(1), unless the authorization is terminated  or revoked sooner.       Influenza A by PCR NEGATIVE NEGATIVE Final   Influenza B by PCR NEGATIVE NEGATIVE Final    Comment: (NOTE) The Xpert Xpress SARS-CoV-2/FLU/RSV plus assay is intended as an aid in the diagnosis of influenza from Nasopharyngeal swab specimens and should not be used as a sole basis for treatment. Nasal washings and aspirates are unacceptable for Xpert Xpress SARS-CoV-2/FLU/RSV testing.  Fact Sheet for Patients: BloggerCourse.com  Fact Sheet for Healthcare Providers: SeriousBroker.it  This test is not yet approved or cleared by the [place] FDA and has been authorized for detection and/or diagnosis of SARS-CoV-2 by FDA under an Emergency Use Authorization (EUA). This EUA will remain in effect (meaning this test can be used) for the duration of the COVID-19 declaration  under Section 564(b)(1) of the Act, 21 U.S.C. section 360bbb-3(b)(1), unless the authorization is terminated or revoked.  Performed at [facility], [address] [place], [place] [address]   Surgical pcr screen     Status: None   Collection Time: 10/29/20  5:19 PM   Specimen: Nasal Mucosa; Nasal Swab  Result Value Ref Range Status   MRSA, PCR NEGATIVE NEGATIVE Final   Staphylococcus aureus NEGATIVE NEGATIVE Final    Comment: (NOTE) The Xpert SA Assay (FDA approved for NASAL specimens in patients 96 years of age and older), is one component of a comprehensive surveillance program. It is not intended to diagnose infection nor to guide or monitor treatment. Performed at [facility], [address], [place], [place] [address]          Radiology Studies: No results found.      Scheduled Meds: . (feeding supplement) PROSource Plus  30 mL Oral BID BM  . aspirin EC  81 mg Oral Daily  . atorvastatin  80 mg Oral Daily  . dapagliflozin propanediol  10 mg Oral Daily  . digoxin  0.125 mg Oral Daily  . feeding supplement  237 mL Oral Q24H  . ivabradine  5 mg Oral BID WC  . losartan  12.5 mg Oral Daily  . multivitamin with minerals  1 tablet Oral Daily  . sodium chloride flush  3 mL Intravenous Q12H  . sodium chloride flush  3 mL Intravenous Q12H  . spironolactone  12.5 mg Oral Daily   Continuous Infusions: . sodium chloride    . sodium chloride    . heparin 1,150 Units/hr (11/02/20 1251)     LOS: 5 days        Kathlen Mody, MD [facility]   To contact the attending provider between 7A-7P or the covering provider during after hours 7P-7A, please log into the web site [email or web address] and access using universal Oasis password for that web site. If you do not have the password, please call the hospital operator.  11/02/2020, 2:28 PM

## 2020-11-02 NOTE — Progress Notes (Signed)
CARDIAC REHAB PHASE I   [phone] Patient resting comfortably in bed upon arrival. EF 14% with LV thrombus. Possible CABG 11/05/20 pending cardiac MRI. HF and CABG pre-op education completed and booklets provided and reviewed including moving in the tube, IS (previously provided and in use) and importance of cough and deep breath. Pre-op video set up and currently being reviewed by patient. All questions and concerns were addressed within the scope of CR RN. Will resume patient follow ups post surgery. Patient encouraged to ambulate as tolerated and as directed based on current cardiac condition.   Renesmae Donahey Hoover Brunette RN, BSN

## 2020-11-02 NOTE — Progress Notes (Signed)
ANTICOAGULATION CONSULT NOTE - Follow Up Consult  Pharmacy Consult for Heparin Indication: LV apical thrombus  Allergies  Allergen Reactions  . Penicillins Other (See Comments)    childhood    Patient Measurements: Height: 6\' 1"  (185.4 cm) Weight: 69.4 kg (153 lb 1.6 oz) IBW/kg (Calculated) : 79.9 kg Heparin Dosing Weight: 77 kg  Vital Signs: Temp: 98.3 F (36.8 C) (05/06 2055) Temp Source: Oral (05/06 2055) BP: 110/74 (05/06 2055) Pulse Rate: 73 (05/06 2055)  Labs: Recent Labs    10/31/20 0429 10/31/20 1618 11/01/20 0242 11/02/20 0205  HGB 12.0*  --  11.5* 11.8*  HCT 35.0*  --  33.3* 34.6*  PLT 246  --  228 224  HEPARINUNFRC 0.92* 0.83* 0.65 0.50  CREATININE 1.29*  --  1.36* 1.32*    Estimated Creatinine Clearance: 51.8 mL/min (A) (by C-G formula based on SCr of 1.32 mg/dL (H)).   Medical History: Past Medical History:  Diagnosis Date  . Anemia   . Asthma    childhood  . Blood in stool   . Childhood asthma   . GI bleed   . Kidney laceration 1999  . Seizures [facility])    Assessment: 70 yo male presented with SOB and found to have mvCAD and an apical thrombus. PTA the patient is not on anticoagulation. Pharmacy is consulted to dose heparin.   Heparin level is therapeutic at 0.50 while running at 1150 units/hr. Per the RN, there have been no issues with the infusion and the patient is without signs or symptoms of bleeding. CBC is stable.    Goal of Therapy:  Heparin level 0.3-0.7 units/ml Monitor platelets by anticoagulation protocol: Yes   Plan:  -Continue heparin IV at 1150 units/hr -Obtain a daily heparin level and CBC -Monitor for signs and symptoms of bleeding  78, PharmD, RPh  PGY-1 Pharmacy Resident 11/02/2020 8:02 AM  Please check [email or web address] for unit-specific pharmacy phone numbers.

## 2020-11-02 NOTE — Plan of Care (Signed)

## 2020-11-03 DIAGNOSIS — E440 Moderate protein-calorie malnutrition: Secondary | ICD-10-CM | POA: Diagnosis not present

## 2020-11-03 DIAGNOSIS — R0602 Shortness of breath: Secondary | ICD-10-CM | POA: Diagnosis not present

## 2020-11-03 LAB — CBC
HCT: 34.1 % — ABNORMAL LOW (ref 39.0–52.0)
Hemoglobin: 11.5 g/dL — ABNORMAL LOW (ref 13.0–17.0)
MCH: 32.5 pg (ref 26.0–34.0)
MCHC: 33.7 g/dL (ref 30.0–36.0)
MCV: 96.3 fL (ref 80.0–100.0)
Platelets: 225 10*3/uL (ref 150–400)
RBC: 3.54 MIL/uL — ABNORMAL LOW (ref 4.22–5.81)
RDW: 14.7 % (ref 11.5–15.5)
WBC: 3.5 10*3/uL — ABNORMAL LOW (ref 4.0–10.5)
nRBC: 0 % (ref 0.0–0.2)

## 2020-11-03 LAB — BASIC METABOLIC PANEL
Anion gap: 8 (ref 5–15)
BUN: 25 mg/dL — ABNORMAL HIGH (ref 8–23)
CO2: 23 mmol/L (ref 22–32)
Calcium: 9.1 mg/dL (ref 8.9–10.3)
Chloride: 105 mmol/L (ref 98–111)
Creatinine, Ser: 1.24 mg/dL (ref 0.61–1.24)
GFR, Estimated: >60 mL/min (ref >60)
Glucose, Bld: 98 mg/dL (ref 70–99)
Potassium: 4.2 mmol/L (ref 3.5–5.1)
Sodium: 136 mmol/L (ref 135–145)

## 2020-11-03 LAB — PROTIME-INR
INR: 1.1 (ref 0.8–1.2)
Prothrombin Time: 13.7 seconds (ref 11.4–15.2)

## 2020-11-03 LAB — HEPARIN LEVEL (UNFRACTIONATED): Heparin Unfractionated: 0.39 IU/mL (ref 0.30–0.70)

## 2020-11-03 NOTE — Progress Notes (Signed)
PROGRESS NOTE    Troy Johnson  GGE:[id number] DOB: [birth year] DOA: 10/28/2020 PCP: [name], MD    Chief Complaint  Patient presents with  . Shortness of Breath    Brief Narrative:  70 year old male with medical history of hypertension presents with shortness of breath.  Patient states that last Friday he started having shortness of breath initially had exertional dyspnea then developed orthopnea for past 2 nights.  Initial troponin was 656, potassium 5.9, chest x-ray showed cardiomegaly and pulmonary vascular congestion.  EKG showed ST depression T wave inversion V5 V6.  Cardiology was consulted, patient received 1 dose of Lasix with improvement in symptoms.  Heparin drip was started in the ED.  Patient went for cardiac catheterization which showed multivessel disease.  cardiothoracic surgery was consulted for CABG. Cardiac MRI ordered for evaluation of viability. PCCM consulted for assistance in pre op evaluation and care as pt will be high risk.  Pt seen and examined, no new complaints at this time. Pt denies any chest pain or sob. Appears comfortable.    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 MRI showed viability and he is scheduled for CABG ON Tuesday.  - PCCM consulted for assistance in pre op evaluation and care.   Acute decompensated CHF:  Resolved He appears compensated at this time. He denies any sob or chest pain.  Continue with digoxin, cozaar and spironolactone added to his regimen.  Continue with strict intake and output.  Patient started on Farxiga.  His creatinine imroved to 1.2.    Acute coronary syndrome:  - elevated troponin s secondary to demand ischemia.   - continue with IV heparin and aspirin.  -no chest pain or sob.    Hypertension:  Well controlled.    Malnutrition of moderate degree.  Nutrition will be consulted.    Hyperlipidemia:  Continue with lipitor 80 mg daily.  ldL is 204.    Stage 2 CKD:  Creatinine at baseline at 1.3   Mild anemia of chronic disease.  Hemoglobin around 11 and stable.    Leukopenia:  Unclear etiology.     DVT prophylaxis: HEPARIN.  Code Status: FULL CODE.  Family Communication: none at bedside.  Disposition:   Status is: Inpatient  Remains inpatient appropriate because:Ongoing diagnostic testing needed not appropriate for outpatient work up and IV treatments appropriate due to intensity of illness or inability to take PO   Dispo: The patient is from: Home              Anticipated d/c is to: Home              Patient currently is not medically stable to d/c.   Difficult to place patient No       Consultants:   CARDIOLOGY  Cardiothoracic surgery.   PCCM.    Procedures: cardiac MRI     Antimicrobials: none.    Subjective: Alert and comfortable, no chest pain or sob.   Objective: Vitals:   11/02/20 1300 11/02/20 2106 11/03/20 0426 11/03/20 0827  BP: (!) 91/56 100/64 110/81 118/80  Pulse: 63  67 70  Resp: 18 16 11 16   Temp: 97.7 F (36.5 C) 98 F (36.7 C) 98.3 F (36.8  C) 97.7 F (36.5 C)  TempSrc: Oral Oral Oral Oral  SpO2: 96% 100% 100% 100%  Weight:   69.1 kg   Height:        Intake/Output Summary (Last 24 hours) at 11/03/2020 1133 Last data filed at 11/03/2020 0800 Gross per 24 hour  Intake 366.5 ml  Output 450 ml  Net -83.5 ml   Filed Weights   10/31/20 0512 11/01/20 0500 11/03/20 0426  Weight: 68.5 kg 69.4 kg 69.1 kg    Examination:  General exam: Alert and comfortable not in any kind of distress Respiratory system: Clear to auscultation bilaterally, no wheezing or rhonchi Cardiovascular system: S1-S2 heard, regular rate rhythm, no JVD no  pedal edema Gastrointestinal system: Abdomen is soft nontender nondistended bowel sounds normal Central nervous system: Alert and oriented, grossly nonfocal Extremities: No pedal edema.  Skin: No rashes seen.  psychiatry: Mood is appropriate    Data Reviewed: I have personally reviewed following labs and imaging studies  CBC: Recent Labs  Lab 10/28/20 1433 10/29/20 0251 10/30/20 0248 10/31/20 0429 11/01/20 0242 11/02/20 0205 11/03/20 0124  WBC 4.7   < > 4.6 4.9 4.2 3.8* 3.5*  NEUTROABS 2.3  --   --   --   --   --   --   HGB 13.1   < > 12.4* 12.0* 11.5* 11.8* 11.5*  HCT 39.0   < > 36.1* 35.0* 33.3* 34.6* 34.1*  MCV 97.0   < > 95.3 95.6 95.4 95.6 96.3  PLT 272   < > 275 246 228 224 225   < > = values in this interval not displayed.    Basic Metabolic Panel: Recent Labs  Lab 10/30/20 0248 10/31/20 0429 11/01/20 0242 11/02/20 0205 11/03/20 0124  NA 138 138 136 137 136  K 3.7 4.0 3.9 4.0 4.2  CL 105 106 106 104 105  CO2 23 24 23 24 23   GLUCOSE 99 92 85 92 98  BUN 26* 25* 24* 20 25*  CREATININE 1.32* 1.29* 1.36* 1.32* 1.24  CALCIUM 9.2 9.2 9.1 9.4 9.1    GFR: Estimated Creatinine Clearance: 55 mL/min (by C-G formula based on SCr of 1.24 mg/dL).  Liver Function Tests: No results for input(s): AST, ALT, ALKPHOS, BILITOT, PROT, ALBUMIN in the last 168 hours.  CBG: No results for input(s): GLUCAP in the last 168 hours.   Recent Results (from the past 240 hour(s))  Resp Panel by RT-PCR (Flu A&B, Covid) Nasopharyngeal Swab     Status: None   Collection Time: 10/28/20  2:50 PM   Specimen: Nasopharyngeal Swab; Nasopharyngeal(NP) swabs in vial transport medium  Result Value Ref Range Status   SARS Coronavirus 2 by RT PCR NEGATIVE NEGATIVE Final    Comment: (NOTE) SARS-CoV-2 target nucleic acids are NOT DETECTED.  The SARS-CoV-2 RNA is generally detectable in upper respiratory specimens during the acute phase of infection. The lowest concentration of SARS-CoV-2  viral copies this assay can detect is 138 copies/mL. A negative result does not preclude SARS-Cov-2 infection and should not be used as the sole basis for treatment or other patient management decisions. A negative result may occur with  improper specimen collection/handling, submission of specimen other than nasopharyngeal swab, presence of viral mutation(s) within the areas targeted by this assay, and inadequate number of viral copies(<138 copies/mL). A negative result must be combined with clinical observations, patient history, and epidemiological information. The expected result is Negative.  Fact Sheet for Patients:  12/28/20  Fact Sheet for Healthcare  Providers:  SeriousBroker.it  This test is no t yet approved or cleared by the [place] and  has been authorized for detection and/or diagnosis of SARS-CoV-2 by FDA under an Emergency Use Authorization (EUA). This EUA will remain  in effect (meaning this test can be used) for the duration of the COVID-19 declaration under Section 564(b)(1) of the Act, 21 U.S.C.section 360bbb-3(b)(1), unless the authorization is terminated  or revoked sooner.       Influenza A by PCR NEGATIVE NEGATIVE Final   Influenza B by PCR NEGATIVE NEGATIVE Final    Comment: (NOTE) The Xpert Xpress SARS-CoV-2/FLU/RSV plus assay is intended as an aid in the diagnosis of influenza from Nasopharyngeal swab specimens and should not be used as a sole basis for treatment. Nasal washings and aspirates are unacceptable for Xpert Xpress SARS-CoV-2/FLU/RSV testing.  Fact Sheet for Patients: BloggerCourse.com  Fact Sheet for Healthcare Providers: SeriousBroker.it  This test is not yet approved or cleared by the [place] FDA and has been authorized for detection and/or diagnosis of SARS-CoV-2 by FDA under an Emergency Use Authorization  (EUA). This EUA will remain in effect (meaning this test can be used) for the duration of the COVID-19 declaration under Section 564(b)(1) of the Act, 21 U.S.C. section 360bbb-3(b)(1), unless the authorization is terminated or revoked.  Performed at [facility], [address] [place], [place] [address]   Surgical pcr screen     Status: None   Collection Time: 10/29/20  5:19 PM   Specimen: Nasal Mucosa; Nasal Swab  Result Value Ref Range Status   MRSA, PCR NEGATIVE NEGATIVE Final   Staphylococcus aureus NEGATIVE NEGATIVE Final    Comment: (NOTE) The Xpert SA Assay (FDA approved for NASAL specimens in patients 39 years of age and older), is one component of a comprehensive surveillance program. It is not intended to diagnose infection nor to guide or monitor treatment. Performed at [facility], [address] [place], [place] [address]          Radiology Studies: No results found.      Scheduled Meds: . (feeding supplement) PROSource Plus  30 mL Oral BID BM  . aspirin EC  81 mg Oral Daily  . atorvastatin  80 mg Oral Daily  . dapagliflozin propanediol  10 mg Oral Daily  . digoxin  0.125 mg Oral Daily  . feeding supplement  237 mL Oral Q24H  . ivabradine  5 mg Oral BID WC  . losartan  12.5 mg Oral Daily  . multivitamin with minerals  1 tablet Oral Daily  . sodium chloride flush  3 mL Intravenous Q12H  . sodium chloride flush  3 mL Intravenous Q12H  . spironolactone  12.5 mg Oral Daily   Continuous Infusions: . sodium chloride    . sodium chloride    . heparin 1,150 Units/hr (11/02/20 1251)     LOS: 6 days        Kathlen Mody, MD [facility]   To contact the attending provider between 7A-7P or the covering provider during after hours 7P-7A, please log into the web site [email or web address] and access using universal [place] password for that web site. If you do not have the password, please call the hospital operator.  11/03/2020,  11:33 AM

## 2020-11-03 NOTE — Progress Notes (Signed)
ANTICOAGULATION CONSULT NOTE - Follow Up Consult  Pharmacy Consult for Heparin Indication: LV apical thrombus  Allergies  Allergen Reactions  . Penicillins Other (See Comments)    childhood    Patient Measurements: Height: 6\' 1"  (185.4 cm) Weight: 69.1 kg (152 lb 6.4 oz) IBW/kg (Calculated) : 79.9 kg Heparin Dosing Weight: 77 kg  Vital Signs: Temp: 98.3 F (36.8 C) (05/08 0426) Temp Source: Oral (05/08 0426) BP: 110/81 (05/08 0426) Pulse Rate: 67 (05/08 0426)  Labs: Recent Labs    11/01/20 0242 11/02/20 0205 11/03/20 0124  HGB 11.5* 11.8* 11.5*  HCT 33.3* 34.6* 34.1*  PLT 228 224 225  HEPARINUNFRC 0.65 0.50 0.39  CREATININE 1.36* 1.32*  --     Estimated Creatinine Clearance: 51.6 mL/min (A) (by C-G formula based on SCr of 1.32 mg/dL (H)).   Medical History: Past Medical History:  Diagnosis Date  . Anemia   . Asthma    childhood  . Blood in stool   . Childhood asthma   . GI bleed   . Kidney laceration 1999  . Seizures [facility])    Assessment: 70 yo male presented with SOB and found to have mvCAD and an apical thrombus. PTA the patient is not on anticoagulation. The patient is tenatively scheduled for a CABG on 5/10. Pharmacy is consulted to dose heparin.   Heparin level is therapeutic at 0.39 while running at 1150 units/hr. Per the RN, there have been no issues with the infusion and the patient is without signs or symptoms of bleeding. CBC is stable.    Goal of Therapy:  Heparin level 0.3-0.7 units/ml Monitor platelets by anticoagulation protocol: Yes   Plan:  -Continue heparin IV at 1150 units/hr -Obtain a daily heparin level and CBC -Monitor for signs and symptoms of bleeding  7/10, PharmD, RPh  PGY-1 Pharmacy Resident 11/03/2020 7:52 AM  Please check [email or web address] for unit-specific pharmacy phone numbers.

## 2020-11-04 ENCOUNTER — Inpatient Hospital Stay (HOSPITAL_COMMUNITY): Payer: Medicare Other

## 2020-11-04 DIAGNOSIS — I25110 Atherosclerotic heart disease of native coronary artery with unstable angina pectoris: Secondary | ICD-10-CM | POA: Diagnosis not present

## 2020-11-04 DIAGNOSIS — R0602 Shortness of breath: Secondary | ICD-10-CM | POA: Diagnosis not present

## 2020-11-04 DIAGNOSIS — E440 Moderate protein-calorie malnutrition: Secondary | ICD-10-CM | POA: Diagnosis not present

## 2020-11-04 DIAGNOSIS — I5021 Acute systolic (congestive) heart failure: Secondary | ICD-10-CM | POA: Diagnosis not present

## 2020-11-04 LAB — BLOOD GAS, ARTERIAL
Acid-base deficit: 0.4 mmol/L (ref 0.0–2.0)
Bicarbonate: 23.6 mmol/L (ref 20.0–28.0)
Drawn by: 51185
FIO2: 21
O2 Saturation: 95.6 %
Patient temperature: 37
pCO2 arterial: 38.2 mmHg (ref 32.0–48.0)
pH, Arterial: 7.408 (ref 7.350–7.450)
pO2, Arterial: 81.9 mmHg — ABNORMAL LOW (ref 83.0–108.0)

## 2020-11-04 LAB — PULMONARY FUNCTION TEST
DL/VA % pred: 61 %
DL/VA: 2.45 ml/min/mmHg/L
DLCO unc % pred: 53 %
DLCO unc: 15.4 ml/min/mmHg
FEF 25-75 Post: 1.67 L/sec
FEF 25-75 Pre: 1.23 L/sec
FEF2575-%Change-Post: 35 %
FEF2575-%Pred-Post: 59 %
FEF2575-%Pred-Pre: 43 %
FEV1-%Change-Post: 39 %
FEV1-%Pred-Post: 87 %
FEV1-%Pred-Pre: 62 %
FEV1-Post: 2.86 L
FEV1-Pre: 2.05 L
FEV1FVC-%Change-Post: 41 %
FEV1FVC-%Pred-Pre: 59 %
FEV6-%Change-Post: 2 %
FEV6-%Pred-Post: 105 %
FEV6-%Pred-Pre: 103 %
FEV6-Post: 4.36 L
FEV6-Pre: 4.27 L
FEV6FVC-%Change-Post: 3 %
FEV6FVC-%Pred-Post: 102 %
FEV6FVC-%Pred-Pre: 98 %
FVC-%Change-Post: -1 %
FVC-%Pred-Post: 103 %
FVC-%Pred-Pre: 104 %
FVC-Post: 4.45 L
FVC-Pre: 4.51 L
Post FEV1/FVC ratio: 64 %
Post FEV6/FVC ratio: 98 %
Pre FEV1/FVC ratio: 45 %
Pre FEV6/FVC Ratio: 95 %
RV % pred: 74 %
RV: 1.93 L
TLC % pred: 86 %
TLC: 6.58 L

## 2020-11-04 LAB — BASIC METABOLIC PANEL
Anion gap: 7 (ref 5–15)
BUN: 26 mg/dL — ABNORMAL HIGH (ref 8–23)
CO2: 24 mmol/L (ref 22–32)
Calcium: 9.4 mg/dL (ref 8.9–10.3)
Chloride: 106 mmol/L (ref 98–111)
Creatinine, Ser: 1.2 mg/dL (ref 0.61–1.24)
GFR, Estimated: >60 mL/min (ref >60)
Glucose, Bld: 96 mg/dL (ref 70–99)
Potassium: 4.2 mmol/L (ref 3.5–5.1)
Sodium: 137 mmol/L (ref 135–145)

## 2020-11-04 LAB — HEMOGLOBIN A1C
Hgb A1c MFr Bld: 5.9 % — ABNORMAL HIGH (ref 4.8–5.6)
Mean Plasma Glucose: 122.63 mg/dL

## 2020-11-04 LAB — CBC
HCT: 35.4 % — ABNORMAL LOW (ref 39.0–52.0)
Hemoglobin: 11.9 g/dL — ABNORMAL LOW (ref 13.0–17.0)
MCH: 32.2 pg (ref 26.0–34.0)
MCHC: 33.6 g/dL (ref 30.0–36.0)
MCV: 95.7 fL (ref 80.0–100.0)
Platelets: 209 10*3/uL (ref 150–400)
RBC: 3.7 MIL/uL — ABNORMAL LOW (ref 4.22–5.81)
RDW: 14.6 % (ref 11.5–15.5)
WBC: 3.7 10*3/uL — ABNORMAL LOW (ref 4.0–10.5)
nRBC: 0 % (ref 0.0–0.2)

## 2020-11-04 LAB — HEPARIN LEVEL (UNFRACTIONATED): Heparin Unfractionated: 0.65 IU/mL (ref 0.30–0.70)

## 2020-11-04 LAB — ABO/RH: ABO/RH(D): A POS

## 2020-11-04 LAB — PREPARE RBC (CROSSMATCH)

## 2020-11-04 LAB — APTT: aPTT: 142 seconds — ABNORMAL HIGH (ref 24–36)

## 2020-11-04 MED ORDER — TRANEXAMIC ACID (OHS) BOLUS VIA INFUSION
15.0000 mg/kg | INTRAVENOUS | Status: AC
  Administered 2020-11-05: 1026 mg via INTRAVENOUS
  Filled 2020-11-04: qty 1026

## 2020-11-04 MED ORDER — TRANEXAMIC ACID 1000 MG/10ML IV SOLN
1.5000 mg/kg/h | INTRAVENOUS | Status: AC
  Administered 2020-11-05: 1.5 mg/kg/h via INTRAVENOUS
  Filled 2020-11-04: qty 25

## 2020-11-04 MED ORDER — BISACODYL 5 MG PO TBEC
5.0000 mg | DELAYED_RELEASE_TABLET | Freq: Once | ORAL | Status: AC
  Administered 2020-11-04: 5 mg via ORAL
  Filled 2020-11-04: qty 1

## 2020-11-04 MED ORDER — SODIUM CHLORIDE 0.9 % IV SOLN
INTRAVENOUS | Status: DC
  Filled 2020-11-04: qty 30

## 2020-11-04 MED ORDER — NOREPINEPHRINE 4 MG/250ML-% IV SOLN
0.0000 ug/min | INTRAVENOUS | Status: AC
  Administered 2020-11-05: 2 ug/min via INTRAVENOUS
  Filled 2020-11-04: qty 250

## 2020-11-04 MED ORDER — MANNITOL 20 % IV SOLN
Freq: Once | INTRAVENOUS | Status: DC
  Filled 2020-11-04: qty 13

## 2020-11-04 MED ORDER — MAGNESIUM SULFATE 50 % IJ SOLN
40.0000 meq | INTRAMUSCULAR | Status: DC
  Filled 2020-11-04: qty 9.85

## 2020-11-04 MED ORDER — ALBUTEROL SULFATE (2.5 MG/3ML) 0.083% IN NEBU
2.5000 mg | INHALATION_SOLUTION | Freq: Once | RESPIRATORY_TRACT | Status: AC
  Administered 2020-11-04: 2.5 mg via RESPIRATORY_TRACT

## 2020-11-04 MED ORDER — TEMAZEPAM 15 MG PO CAPS
15.0000 mg | ORAL_CAPSULE | Freq: Once | ORAL | Status: AC | PRN
  Administered 2020-11-04: 15 mg via ORAL
  Filled 2020-11-04: qty 1

## 2020-11-04 MED ORDER — METOPROLOL TARTRATE 12.5 MG HALF TABLET
12.5000 mg | ORAL_TABLET | Freq: Once | ORAL | Status: AC
  Administered 2020-11-05: 12.5 mg via ORAL
  Filled 2020-11-04: qty 1

## 2020-11-04 MED ORDER — PHENYLEPHRINE HCL-NACL 20-0.9 MG/250ML-% IV SOLN
30.0000 ug/min | INTRAVENOUS | Status: AC
  Administered 2020-11-05: 40 ug/min via INTRAVENOUS
  Filled 2020-11-04: qty 250

## 2020-11-04 MED ORDER — POTASSIUM CHLORIDE 2 MEQ/ML IV SOLN
80.0000 meq | INTRAVENOUS | Status: DC
  Filled 2020-11-04: qty 40

## 2020-11-04 MED ORDER — INSULIN REGULAR(HUMAN) IN NACL 100-0.9 UT/100ML-% IV SOLN
INTRAVENOUS | Status: AC
  Administered 2020-11-05: 1 [IU]/h via INTRAVENOUS
  Filled 2020-11-04: qty 100

## 2020-11-04 MED ORDER — VANCOMYCIN HCL 1250 MG/250ML IV SOLN
1250.0000 mg | INTRAVENOUS | Status: AC
  Administered 2020-11-05: 1250 mg via INTRAVENOUS
  Filled 2020-11-04: qty 250

## 2020-11-04 MED ORDER — NITROGLYCERIN IN D5W 200-5 MCG/ML-% IV SOLN
2.0000 ug/min | INTRAVENOUS | Status: DC
  Filled 2020-11-04: qty 250

## 2020-11-04 MED ORDER — CHLORHEXIDINE GLUCONATE CLOTH 2 % EX PADS
6.0000 | MEDICATED_PAD | Freq: Once | CUTANEOUS | Status: AC
  Administered 2020-11-04: 6 via TOPICAL

## 2020-11-04 MED ORDER — TRANEXAMIC ACID (OHS) PUMP PRIME SOLUTION
2.0000 mg/kg | INTRAVENOUS | Status: DC
  Filled 2020-11-04: qty 1.37

## 2020-11-04 MED ORDER — MILRINONE LACTATE IN DEXTROSE 20-5 MG/100ML-% IV SOLN
0.3000 ug/kg/min | INTRAVENOUS | Status: DC
  Filled 2020-11-04: qty 100

## 2020-11-04 MED ORDER — DEXMEDETOMIDINE HCL IN NACL 400 MCG/100ML IV SOLN
0.1000 ug/kg/h | INTRAVENOUS | Status: AC
  Administered 2020-11-05: .2 ug/kg/h via INTRAVENOUS
  Filled 2020-11-04: qty 100

## 2020-11-04 MED ORDER — CHLORHEXIDINE GLUCONATE 0.12 % MT SOLN
15.0000 mL | Freq: Once | OROMUCOSAL | Status: AC
  Administered 2020-11-05: 15 mL via OROMUCOSAL
  Filled 2020-11-04: qty 15

## 2020-11-04 MED ORDER — LEVOFLOXACIN IN D5W 500 MG/100ML IV SOLN
500.0000 mg | INTRAVENOUS | Status: AC
  Administered 2020-11-05: 500 mg via INTRAVENOUS
  Filled 2020-11-04: qty 100

## 2020-11-04 MED ORDER — PLASMA-LYTE 148 IV SOLN
INTRAVENOUS | Status: DC
  Filled 2020-11-04: qty 2.5

## 2020-11-04 MED ORDER — EPINEPHRINE HCL 5 MG/250ML IV SOLN IN NS
0.0000 ug/min | INTRAVENOUS | Status: DC
  Filled 2020-11-04: qty 250

## 2020-11-04 NOTE — TOC Progression Note (Signed)
Transition of Care (TOC) - Progression Note  Heart Failure   Patient Details  Name: Troy Johnson MRN: [id number] Date of Birth: 1950-08-07  Transition of Care [facility] CM/SW Contact  Geroldine Esquivias, LCSWA Phone Number: 11/04/2020, 2:45 PM  Clinical Narrative:    CSW spoke with patient at bedside who indicated that he is doing okay and doesn't need anything at this time. CSW confirmed with the patient about his PCP and scheduling a hospital follow up upon discharge and Mr. Crisco agreed.  TOC to continue to follow for discharge plans.     Barriers to Discharge: Continued Medical Work up  Expected Discharge Plan and Services   In-house Referral: Clinical Social Work                                             Social Determinants of Health (SDOH) Interventions Food Insecurity Interventions: Intervention Not Indicated Financial Strain Interventions: Intervention Not Indicated Housing Interventions: Intervention Not Indicated Transportation Interventions: Intervention Not Indicated  Readmission Risk Interventions No flowsheet data found.  Kendalyn Cranfield, MSW, LCSWA [phone] Heart Failure Social Worker

## 2020-11-04 NOTE — Progress Notes (Signed)
ANTICOAGULATION CONSULT NOTE - Follow Up Consult  Pharmacy Consult for Heparin Indication: LV apical thrombus  Allergies  Allergen Reactions  . Penicillins Other (See Comments)    childhood    Patient Measurements: Height: 6\' 1"  (185.4 cm) Weight: 68.4 kg (150 lb 14.4 oz) IBW/kg (Calculated) : 79.9 kg Heparin Dosing Weight: 77 kg  Vital Signs: Temp: 97.7 F (36.5 C) (05/09 0644) Temp Source: Oral (05/09 1314) BP: 110/93 (05/09 1314) Pulse Rate: 71 (05/09 1314)  Labs: Recent Labs    11/02/20 0205 11/03/20 0124 11/03/20 1858 11/04/20 0145  HGB 11.8* 11.5*  --  11.9*  HCT 34.6* 34.1*  --  35.4*  PLT 224 225  --  209  APTT  --   --   --  142*  LABPROT  --   --  13.7  --   INR  --   --  1.1  --   HEPARINUNFRC 0.50 0.39  --  0.65  CREATININE 1.32* 1.24  --  1.20    Estimated Creatinine Clearance: 56.2 mL/min (by C-G formula based on SCr of 1.2 mg/dL).   Medical History: Past Medical History:  Diagnosis Date  . Anemia   . Asthma    childhood  . Blood in stool   . Childhood asthma   . GI bleed   . Kidney laceration 1999  . Seizures [facility])    Assessment: 70 yo male presented with SOB and found to have mvCAD and an apical thrombus. PTA the patient is not on anticoagulation. The patient is tenatively scheduled for a CABG on 5/10. Pharmacy is consulted to dose heparin.   Heparin level is therapeutic at 0.65 while running at 1150 units/hr. Per the RN, there have been no issues with the infusion and the patient is without signs or symptoms of bleeding. CBC is stable.    Goal of Therapy:  Heparin level 0.3-0.7 units/ml Monitor platelets by anticoagulation protocol: Yes   Plan:  -Continue heparin IV at 1150 units/hr -Obtain a daily heparin level and CBC -Monitor for signs and symptoms of bleeding  7/10, BCCP Clinical Pharmacist  11/04/2020 2:38 PM   [facility] pharmacy phone numbers are listed on [email or web address] .

## 2020-11-04 NOTE — Progress Notes (Signed)
     [address]       [place] [address]             [phone]       No events  Vitals:   11/04/20 0920 11/04/20 1314  BP:  (!) 110/93  Pulse: 71 71  Resp:  16  Temp:    SpO2:  100%   Alert NAD Sinus EWOB  70 yo male with 3v CAD, CHF with EF 15% OR tomorrow for CABG.  Keenen Roessner Keane Scrape

## 2020-11-04 NOTE — Progress Notes (Signed)
Subjective:  Feels well.  Eating breakfast this morning.  Denies chest pain, shortness of breath.  Objective:  Vital Signs in the last 24 hours: Temp:  [97.5 F (36.4 C)-98.3 F (36.8 C)] 97.7 F (36.5 C) (05/09 0644) Pulse Rate:  [61-78] 71 (05/09 0920) Resp:  [15-20] 16 (05/09 0916) BP: (94-111)/(62-76) 94/62 (05/09 0916) SpO2:  [96 %-100 %] 100 % (05/09 0644) Weight:  [68.4 kg] 68.4 kg (05/09 0644)  Intake/Output from previous day: 05/08 0701 - 05/09 0700 In: 1257 [P.O.:960; I.V.:297] Out: 1200 [Urine:1200]  Physical Exam Vitals and nursing note reviewed.  Constitutional:      General: He is not in acute distress.    Appearance: He is well-developed.  HENT:     Head: Normocephalic and atraumatic.  Eyes:     Conjunctiva/sclera: Conjunctivae normal.     Pupils: Pupils are equal, round, and reactive to light.  Neck:     Vascular: No JVD.  Cardiovascular:     Rate and Rhythm: Normal rate and regular rhythm.     Pulses: Normal pulses and intact distal pulses.     Heart sounds: No murmur heard.   Pulmonary:     Effort: Pulmonary effort is normal.     Breath sounds: Normal breath sounds. No wheezing or rales.  Abdominal:     General: Bowel sounds are normal.     Palpations: Abdomen is soft.     Tenderness: There is no rebound.  Musculoskeletal:        General: No tenderness. Normal range of motion.     Left lower leg: No edema.  Lymphadenopathy:     Cervical: No cervical adenopathy.  Skin:    General: Skin is warm and dry.  Neurological:     Mental Status: He is alert and oriented to person, place, and time.     Cranial Nerves: No cranial nerve deficit.      Lab Results: BMP Recent Labs    11/24/19 1102 10/28/20 1433 11/02/20 0205 11/03/20 0124 11/04/20 0145  NA 143   < > 137 136 137  K 5.3*   < > 4.0 4.2 4.2  CL 102   < > 104 105 106  CO2 26   < > 24 23 24   GLUCOSE 87   < > 92 98 96  BUN 16   < > 20 25* 26*  CREATININE 1.12   < > 1.32* 1.24 1.20   CALCIUM 10.4*   < > 9.4 9.1 9.4  GFRNONAA 67   < > 58* >60 >60  GFRAA 78  --   --   --   --    < > = values in this interval not displayed.    CBC Recent Labs  Lab 10/28/20 1433 10/29/20 0251 11/04/20 0145  WBC 4.7   < > 3.7*  RBC 4.02*   < > 3.70*  HGB 13.1   < > 11.9*  HCT 39.0   < > 35.4*  PLT 272   < > 209  MCV 97.0   < > 95.7  MCH 32.6   < > 32.2  MCHC 33.6   < > 33.6  RDW 15.1   < > 14.6  LYMPHSABS 1.7  --   --   MONOABS 0.5  --   --   EOSABS 0.1  --   --   BASOSABS 0.0  --   --    < > = values in this interval not displayed.  HEMOGLOBIN A1C Lab Results  Component Value Date   HGBA1C 5.9 (H) 11/04/2020   MPG 122.63 11/04/2020    Cardiac Panel (last 3 results) No results for input(s): CKTOTAL, CKMB, TROPONINI, RELINDX in the last 8760 hours.  BNP (last 3 results) Recent Labs    10/28/20 1433 11/02/20 0205  BNP 1,827.5* 1,261.6*    Lipid Panel     Component Value Date/Time   CHOL 257 (H) 10/30/2020 0248   TRIG 63 10/30/2020 0248   HDL 40 (L) 10/30/2020 0248   CHOLHDL 6.4 10/30/2020 0248   VLDL 13 10/30/2020 0248   LDLCALC 204 (H) 10/30/2020 0248     Hepatic Function Panel Recent Labs    11/24/19 1102  PROT 6.9  ALBUMIN 4.3  AST 18  ALT 20  ALKPHOS 76  BILITOT 0.3    Imaging: Chest x-ray 10/28/2020:  Cardiomegaly should be Borderline mild congestive heart failure. Trace right pleural effusion.    Cardiac Studies:  Cardiac MRI 10/30/2020: 1. Severe LV enlargement with severe global hypokinesis with regional variation. EF 14%. 2.  Small apical LV thrombus. 3.  Normal RV size with mildly decreased systolic function, EF 36%. 4.  Moderate mitral regurgitation. 5. Based on severe LV dysfunction, there was less late gadolinium enhancement than I would have expected. With the exception of the apical septal wall and the true apex, would expect the remainder of the LV myocardium to be viable.   Echocardiogram 10/29/2020: 1. Left  ventricular ejection fraction, by estimation, is <20%. The left  ventricle has severely decreased function. The left ventricle demonstrates  global hypokinesis. The left ventricular internal cavity size was mildly  dilated. Left ventricular  diastolic parameters are consistent with Grade II diastolic dysfunction  (pseudonormalization). Small LV apical thrombus seen on Definity contrast  study.  2. Right ventricular systolic function is mildly reduced. The right  ventricular size is normal. There is normal pulmonary artery systolic  pressure.  3. Left atrial size was mildly dilated.  4. Right atrial size was mildly dilated.  5. Mild thciekning of mitral valve leaflets. Moderate mitral valve  regurgitation.   Coronary angiography 10/29/2020: LM: Long vessel. Normal LAD: Prox 90% stenosis, followed by flush occlusion after Diag 1        No antegrade flow seen in LAD        Faint collaterals from RPL fill small caliber LAD        Diag 1 with prox tandem 95% stenoses LCx: Prox tandem 95% stenoses        Prox OM1 60% stenosis        Mid LCx 60% stenosis RCA: Prox 90% stenosis, followed by 100% occlusion         Bridging collaterals faintly fill mid RCA, and reconstitute distal RCA         Prox RPDA 70% stenosis, followed by 50 stenosis  PA: 39/14 mmHg, mean PAP 23 mmHg PCW: 6 mmHg  Compensated ischemic cardiomyopathy   EKG 10/28/2020: Sinus rhythm, LVH Inferolateral T wave inversion, consider ischemia  Assessment & Recommendations:  70 y.o. African Aemrican male  with h/o anemia 2018, possible seizure 11/2019, mixed hyperlipidemia, now with new diagnosis of ischemic cardiomyopathy, multivessel CAD  Multivessel CAD: Optimal revascularization will be surgical. LVEF 10% with questionable LV apical thrombus. Cardiac MRI confirms EF 14% and LV apical thrombus Except for apical septal wall and the true apex, rest of the myocardium likely viable. Appreciate CVTS input. Awaiting  CABG.   Ischemic cardiomyopathy: Acute  decompensation now resolving.  Appears euvolemic on exam with chronic systolic heart failure. LVEF 10% with possible LV apical thrombus. Continue IV heparin Continue Corlanor 5 mg twice daily, losartan 12.5 mg daily, spironolactone 12.5 mg daily. Will initiate beta-blocker after CABG. Appreciate heart failure team input.  They are recommending perioperative use of Impella 5.5.  Mixed hyperlipidemia: LDL 204.  Continue Lipitor 80 mg. Will likely need Repatha outpatient.  I wished him luck for CABG, tentatively scheduled for 11/05/2020 morning.    Elder Negus, MD Pager: [phone] Office: [phone]

## 2020-11-04 NOTE — Progress Notes (Signed)
PROGRESS NOTE    Troy Johnson  YPP:[id number] DOB: Aug 09, 1950 DOA: 10/28/2020 PCP: [name], MD    Chief Complaint  Patient presents with  . Shortness of Breath    Brief Narrative:  70 year old male with medical history of hypertension presents with shortness of breath.  Patient states that last Friday he started having shortness of breath initially had exertional dyspnea then developed orthopnea for past 2 nights.  Initial troponin was 656, potassium 5.9, chest x-ray showed cardiomegaly and pulmonary vascular congestion.  EKG showed ST depression T wave inversion V5 V6.  Cardiology was consulted, patient received 1 dose of Lasix with improvement in symptoms.  Heparin drip was started in the ED.  Patient went for cardiac catheterization which showed multivessel disease.  cardiothoracic surgery was consulted for CABG. Cardiac MRI ordered for evaluation of viability. PCCM consulted for assistance in pre op evaluation and care as pt will be high risk.  Patient seen and examined at bedside sitting comfortably in the chair no new events overnight.   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 MRI showed viability and he is scheduled for CABG ON Tuesday.  - PCCM consulted for assistance in pre op evaluation and care.   Acute decompensated CHF:  Resolved He appears compensated at this time.  Patient currently denies any chest pain shortness of breath or pedal edema. Continue with digoxin, cozaar and spironolactone added to his regimen.  Continue with strict intake and output.  Patient started on Farxiga.  Creatinine stable at 1.2   Acute coronary syndrome:  - elevated troponin s  secondary to demand ischemia.  - continue with IV heparin and aspirin.  -no chest pain or sob.    Hypertension:  Blood pressure parameters are optimal   Malnutrition of moderate degree.  Nutrition will be consulted.    Hyperlipidemia:  Continue with lipitor 80 mg daily.  LDL is 204   Stage 2 CKD:  Creatinine at baseline 1.3,  Mild anemia of chronic disease.  Hemoglobin around 11 and stable.    Leukopenia:  Unclear etiology.     DVT prophylaxis: HEPARIN.  Code Status: FULL CODE.  Family Communication: none at bedside.  Disposition:   Status is: Inpatient  Remains inpatient appropriate because:Ongoing diagnostic testing needed not appropriate for outpatient work up and IV treatments appropriate due to intensity of illness or inability to take PO   Dispo: The patient is from: Home              Anticipated d/c is to: Home              Patient currently is not medically stable to d/c.   Difficult to place patient No       Consultants:   CARDIOLOGY  Cardiothoracic surgery.   PCCM.    Procedures: cardiac MRI     Antimicrobials: none.    Subjective: Patient denies any chest pain or shortness of breath  Objective: Vitals:   11/04/20 0644 11/04/20 0916 11/04/20 0920 11/04/20 1314  BP: 111/76 94/62  (!) 110/93  Pulse: 68 78 71 71  Resp: 15 16  16   Temp: 97.7 F (36.5 C)     TempSrc: Oral   Oral  SpO2: 100%   100%  Weight: 68.4 kg     Height:        Intake/Output Summary (Last 24 hours) at 11/04/2020 1402 Last data filed at 11/04/2020 0600 Gross per 24 hour  Intake 657 ml  Output 750 ml  Net -93 ml   Filed Weights   11/01/20 0500 11/03/20 0426 11/04/20 0644  Weight: 69.4 kg 69.1 kg 68.4 kg    Examination:  General exam: Comfortable not in distress Respiratory system: Clear to auscultation bilaterally Cardiovascular system: S1-S2 heard, regular rate rhythm, no pedal edema Gastrointestinal system: Abdomen is soft nontender, bowel  sounds normal Central nervous system: Alert and oriented Extremities: No pedal edema.  Skin: No rashes seen.  psychiatry: Mood is appropriate    Data Reviewed: I have personally reviewed following labs and imaging studies  CBC: Recent Labs  Lab 10/28/20 1433 10/29/20 0251 10/31/20 0429 11/01/20 0242 11/02/20 0205 11/03/20 0124 11/04/20 0145  WBC 4.7   < > 4.9 4.2 3.8* 3.5* 3.7*  NEUTROABS 2.3  --   --   --   --   --   --   HGB 13.1   < > 12.0* 11.5* 11.8* 11.5* 11.9*  HCT 39.0   < > 35.0* 33.3* 34.6* 34.1* 35.4*  MCV 97.0   < > 95.6 95.4 95.6 96.3 95.7  PLT 272   < > 246 228 224 225 209   < > = values in this interval not displayed.    Basic Metabolic Panel: Recent Labs  Lab 10/31/20 0429 11/01/20 0242 11/02/20 0205 11/03/20 0124 11/04/20 0145  NA 138 136 137 136 137  K 4.0 3.9 4.0 4.2 4.2  CL 106 106 104 105 106  CO2 24 23 24 23 24   GLUCOSE 92 85 92 98 96  BUN 25* 24* 20 25* 26*  CREATININE 1.29* 1.36* 1.32* 1.24 1.20  CALCIUM 9.2 9.1 9.4 9.1 9.4    GFR: Estimated Creatinine Clearance: 56.2 mL/min (by C-G formula based on SCr of 1.2 mg/dL).  Liver Function Tests: No results for input(s): AST, ALT, ALKPHOS, BILITOT, PROT, ALBUMIN in the last 168 hours.  CBG: No results for input(s): GLUCAP in the last 168 hours.   Recent Results (from the past 240 hour(s))  Resp Panel by RT-PCR (Flu A&B, Covid) Nasopharyngeal Swab     Status: None   Collection Time: 10/28/20  2:50 PM   Specimen: Nasopharyngeal Swab; Nasopharyngeal(NP) swabs in vial transport medium  Result Value Ref Range Status   SARS Coronavirus 2 by RT PCR NEGATIVE NEGATIVE Final    Comment: (NOTE) SARS-CoV-2 target nucleic acids are NOT DETECTED.  The SARS-CoV-2 RNA is generally detectable in upper respiratory specimens during the acute phase of infection. The lowest concentration of SARS-CoV-2 viral copies this assay can detect is 138 copies/mL. A negative result does not preclude  SARS-Cov-2 infection and should not be used as the sole basis for treatment or other patient management decisions. A negative result may occur with  improper specimen collection/handling, submission of specimen other than nasopharyngeal swab, presence of viral mutation(s) within the areas targeted by this assay, and inadequate number of viral copies(<138 copies/mL). A negative result must be combined with clinical observations, patient history, and epidemiological information. The expected result is Negative.  Fact Sheet for Patients:  12/28/20  Fact Sheet for Healthcare Providers:  BloggerCourse.com  This test is no t yet approved or cleared by the [place] FDA and  has been authorized for detection and/or  diagnosis of SARS-CoV-2 by FDA under an Emergency Use Authorization (EUA). This EUA will remain  in effect (meaning this test can be used) for the duration of the COVID-19 declaration under Section 564(b)(1) of the Act, 21 U.S.C.section 360bbb-3(b)(1), unless the authorization is terminated  or revoked sooner.       Influenza A by PCR NEGATIVE NEGATIVE Final   Influenza B by PCR NEGATIVE NEGATIVE Final    Comment: (NOTE) The Xpert Xpress SARS-CoV-2/FLU/RSV plus assay is intended as an aid in the diagnosis of influenza from Nasopharyngeal swab specimens and should not be used as a sole basis for treatment. Nasal washings and aspirates are unacceptable for Xpert Xpress SARS-CoV-2/FLU/RSV testing.  Fact Sheet for Patients: BloggerCourse.comhttps://www.fda.gov/media/152166/download  Fact Sheet for Healthcare Providers: SeriousBroker.ithttps://www.fda.gov/media/152162/download  This test is not yet approved or cleared by the [place] FDA and has been authorized for detection and/or diagnosis of SARS-CoV-2 by FDA under an Emergency Use Authorization (EUA). This EUA will remain in effect (meaning this test can be used) for the duration of  the COVID-19 declaration under Section 564(b)(1) of the Act, 21 U.S.C. section 360bbb-3(b)(1), unless the authorization is terminated or revoked.  Performed at [facility], [address] [place], [place] [address]   Surgical pcr screen     Status: None   Collection Time: 10/29/20  5:19 PM   Specimen: Nasal Mucosa; Nasal Swab  Result Value Ref Range Status   MRSA, PCR NEGATIVE NEGATIVE Final   Staphylococcus aureus NEGATIVE NEGATIVE Final    Comment: (NOTE) The Xpert SA Assay (FDA approved for NASAL specimens in patients 70 years of age and older), is one component of a comprehensive surveillance program. It is not intended to diagnose infection nor to guide or monitor treatment. Performed at [facility], [address] [place], [place] [address]          Radiology Studies: No results found.      Scheduled Meds: . (feeding supplement) PROSource Plus  30 mL Oral BID BM  . aspirin EC  81 mg Oral Daily  . atorvastatin  80 mg Oral Daily  . dapagliflozin propanediol  10 mg Oral Daily  . digoxin  0.125 mg Oral Daily  . [START ON 11/05/2020] epinephrine  0-10 mcg/min Intravenous To OR  . feeding supplement  237 mL Oral Q24H  . [START ON 11/05/2020] heparin-papaverine-plasmalyte irrigation   Irrigation To OR  . [START ON 11/05/2020] insulin   Intravenous To OR  . ivabradine  5 mg Oral BID WC  . [START ON 11/05/2020] [place] Blood Cardioplegia vial (lidocaine/magnesium/mannitol 0.26g-4g-6.4g)   Intracoronary Once  . losartan  12.5 mg Oral Daily  . [START ON 11/05/2020] magnesium sulfate  40 mEq Other To OR  . multivitamin with minerals  1 tablet Oral Daily  . [START ON 11/05/2020] phenylephrine  30-200 mcg/min Intravenous To OR  . [START ON 11/05/2020] potassium chloride  80 mEq Other To OR  . sodium chloride flush  3 mL Intravenous Q12H  . sodium chloride flush  3 mL Intravenous Q12H  . spironolactone  12.5 mg Oral Daily  . [START ON 11/05/2020] tranexamic  acid  15 mg/kg Intravenous To OR  . [START ON 11/05/2020] tranexamic acid  2 mg/kg Intracatheter To OR   Continuous Infusions: . sodium chloride    . sodium chloride    . [START ON 11/05/2020] dexmedetomidine    . [START ON 11/05/2020] heparin 30,000 units/NS 1000 mL solution for CELLSAVER    .  heparin 1,150 Units/hr (11/04/20 1103)  . [START ON 11/05/2020] levofloxacin (LEVAQUIN) IV    . [START ON 11/05/2020] milrinone    . [START ON 11/05/2020] nitroGLYCERIN    . [START ON 11/05/2020] norepinephrine    . [START ON 11/05/2020] tranexamic acid (CYKLOKAPRON) infusion ([facility])    . [START ON 11/05/2020] vancomycin       LOS: 7 days        Kathlen Mody, MD [facility]   To contact the attending provider between 7A-7P or the covering provider during after hours 7P-7A, please log into the web site [email or web address] and access using universal [place] password for that web site. If you do not have the password, please call the hospital operator.  11/04/2020, 2:02 PM

## 2020-11-04 NOTE — Progress Notes (Addendum)
Patient ID: Troy Johnson, male   DOB: 1951-02-04, 70 y.o.   MRN: [id number]     Advanced Heart Failure Rounding Note  PCP-Cardiologist: None   Subjective:    CP free. No events over the weekend. BP/HR stable. Wt stable, overall down 6 lb. Labs ok. Scheduled for CABG tomorrow. No complaints.   Cardiac MRI:  1. Severe LV enlargement with severe global hypokinesis with regional variation. EF 14%. 2.  Small apical LV thrombus. 3.  Normal RV size with mildly decreased systolic function, EF 36%. 4.  Moderate mitral regurgitation. 5. Based on severe LV dysfunction, there was less late gadolinium enhancement than I would have expected. With the exception of the apical septal wall and the true apex, would expect the remainder of the LV myocardium to be viable.   Objective:   Weight Range: 68.4 kg Body mass index is 19.91 kg/m.   Vital Signs:   Temp:  [97.5 F (36.4 C)-98.3 F (36.8 C)] 97.7 F (36.5 C) (05/09 0644) Pulse Rate:  [61-70] 68 (05/09 0644) Resp:  [15-20] 15 (05/09 0644) BP: (94-118)/(63-80) 111/76 (05/09 0644) SpO2:  [96 %-100 %] 100 % (05/09 0644) Weight:  [68.4 kg] 68.4 kg (05/09 0644) Last BM Date: 11/03/20  Weight change: Filed Weights   11/01/20 0500 11/03/20 0426 11/04/20 0644  Weight: 69.4 kg 69.1 kg 68.4 kg    Intake/Output:   Intake/Output Summary (Last 24 hours) at 11/04/2020 0709 Last data filed at 11/04/2020 0600 Gross per 24 hour  Intake 1257 ml  Output 1200 ml  Net 57 ml      Physical Exam   PHYSICAL EXAM: General:  Well appearing. No respiratory difficulty HEENT: normal Neck: supple. no JVD. Carotids 2+ bilat; no bruits. No lymphadenopathy or thyromegaly appreciated. Cor: PMI nondisplaced. Regular rate & rhythm. No rubs, gallops or murmurs. Lungs: clear Abdomen: soft, nontender, nondistended. No hepatosplenomegaly. No bruits or masses. Good bowel sounds. Extremities: no cyanosis, clubbing, rash, edema Neuro: alert & oriented x 3,  cranial nerves grossly intact. moves all 4 extremities w/o difficulty. Affect pleasant.   Telemetry   NSR w/ occasional PVCs, 60s (personally reviewed)  Labs    CBC Recent Labs    11/03/20 0124 11/04/20 0145  WBC 3.5* 3.7*  HGB 11.5* 11.9*  HCT 34.1* 35.4*  MCV 96.3 95.7  PLT 225 209   Basic Metabolic Panel Recent Labs    67/61/95 0124 11/04/20 0145  NA 136 137  K 4.2 4.2  CL 105 106  CO2 23 24  GLUCOSE 98 96  BUN 25* 26*  CREATININE 1.24 1.20  CALCIUM 9.1 9.4   Liver Function Tests No results for input(s): AST, ALT, ALKPHOS, BILITOT, PROT, ALBUMIN in the last 72 hours. No results for input(s): LIPASE, AMYLASE in the last 72 hours. Cardiac Enzymes No results for input(s): CKTOTAL, CKMB, CKMBINDEX, TROPONINI in the last 72 hours.  BNP: BNP (last 3 results) Recent Labs    10/28/20 1433 11/02/20 0205  BNP 1,827.5* 1,261.6*    ProBNP (last 3 results) No results for input(s): PROBNP in the last 8760 hours.   D-Dimer No results for input(s): DDIMER in the last 72 hours. Hemoglobin A1C Recent Labs    11/04/20 0145  HGBA1C 5.9*   Fasting Lipid Panel No results for input(s): CHOL, HDL, LDLCALC, TRIG, CHOLHDL, LDLDIRECT in the last 72 hours. Thyroid Function Tests No results for input(s): TSH, T4TOTAL, T3FREE, THYROIDAB in the last 72 hours.  Invalid input(s): FREET3  Other results:  Imaging    No results found.   Medications:     Scheduled Medications: . (feeding supplement) PROSource Plus  30 mL Oral BID BM  . aspirin EC  81 mg Oral Daily  . atorvastatin  80 mg Oral Daily  . dapagliflozin propanediol  10 mg Oral Daily  . digoxin  0.125 mg Oral Daily  . feeding supplement  237 mL Oral Q24H  . ivabradine  5 mg Oral BID WC  . losartan  12.5 mg Oral Daily  . multivitamin with minerals  1 tablet Oral Daily  . sodium chloride flush  3 mL Intravenous Q12H  . sodium chloride flush  3 mL Intravenous Q12H  . spironolactone  12.5 mg Oral Daily     Infusions: . sodium chloride    . sodium chloride    . heparin 1,150 Units/hr (11/03/20 1520)    PRN Medications: sodium chloride, sodium chloride, acetaminophen, ipratropium-albuterol, ondansetron (ZOFRAN) IV, sodium chloride flush, sodium chloride flush   Assessment/Plan   1. CAD: No chest pain, presented with dyspnea.  NSTEMI with HS-TnI elevated to peak 746.  Cath showed severe multivessel CAD with reasonable targets for CABG.  However, EF < 20%.  Cardiac MRI suggested extensive viability (except for apical septal wall and true apex).  Think he is a CABG candidate but will be high risk.  - ASA 81 + atorvastatin 80 mg daily.  - High risk CABG, for tomorrow with Dr. Cliffton Asters.  Would consider Impella 5.5 placement for post-operative period.  2. Acute systolic CHF: Ischemic cardiomyopathy.  Echo this admission with EF <20%, the septum and apex were akinetic, RV function looked low normal, moderate MR.  Cardiac MRI showed EF 14% with scar in the apical septal wall and apex, mildly decreased RV systolic function. Suspect significant viability as above.  He is not volume overloaded on exam and RHC showed normal filling pressures.  CI preserved on RHC at 2.5.  Denies dyspnea.  - Continue Corlanor 5 mg bid, HR 60s.  - Continue digoxin 0.125 daily.  - Continue losartan 12.5 mg daily, would not increase pre-CABG.  - Continue spironolactone 12.5 mg daily.  - Continue dapagliflozin 10 mg daily for a little gentle diuresis.  - Would hope for improvement in function with revascularization.  3. LV thrombus: Small, noted on echo and cMRI.  - Heparin gtt for now, warfarin after surgery.  4. Mitral regurgitation: Likely functional.  Moderate.  5. Hyperlipidemia: Very high LDL.   - Atorvastatin 80 daily.  - May need Repatha in future.   Length of Stay: [address] PA-C  11/04/2020, 7:09 AM  Advanced Heart Failure Team Pager [phone] (M-F; 7a - 5p)  Please contact [facility]  for night-coverage after hours (5p -7a ) and weekends on [email or web address]  Patient seen with PA, agree with the above note.   No complaints today, had quiet weekend.  No dyspnea walking down hall.  Creatinine stable.   He looks ready for CABG tomorrow.  Will be high risk given severely decreased LV systolic function.  Will follow closely post-op.   Troy Johnson 11/04/2020 8:08 AM

## 2020-11-04 NOTE — Progress Notes (Signed)
CARDIAC REHAB PHASE I   Pt up in chair on phone this morning. Reinforced preop education. Pt states he has help from family after discharge. Denies further questions or concerns at this time. Will continue to follow throughout his hospital stay.  [id number] Reynold Bowen, RN BSN 11/04/2020 11:35 AM

## 2020-11-05 ENCOUNTER — Inpatient Hospital Stay (HOSPITAL_COMMUNITY): Payer: Medicare Other | Admitting: Certified Registered Nurse Anesthetist

## 2020-11-05 ENCOUNTER — Other Ambulatory Visit (HOSPITAL_COMMUNITY): Payer: Self-pay

## 2020-11-05 ENCOUNTER — Telehealth (HOSPITAL_COMMUNITY): Payer: Self-pay | Admitting: Pharmacy Technician

## 2020-11-05 ENCOUNTER — Inpatient Hospital Stay (HOSPITAL_COMMUNITY): Payer: Medicare Other

## 2020-11-05 ENCOUNTER — Inpatient Hospital Stay (HOSPITAL_COMMUNITY)
Admission: EM | Disposition: A | Discharge status: Home / Self Care | Payer: Self-pay | Source: Ambulatory Visit | Attending: Thoracic Surgery (Cardiothoracic Vascular Surgery)

## 2020-11-05 DIAGNOSIS — I2510 Atherosclerotic heart disease of native coronary artery without angina pectoris: Secondary | ICD-10-CM

## 2020-11-05 HISTORY — PX: TEE WITHOUT CARDIOVERSION: SHX5443

## 2020-11-05 HISTORY — PX: CORONARY ARTERY BYPASS GRAFT: SHX141

## 2020-11-05 LAB — POCT I-STAT, CHEM 8
BUN: 24 mg/dL — ABNORMAL HIGH (ref 8–23)
BUN: 27 mg/dL — ABNORMAL HIGH (ref 8–23)
BUN: 22 mg/dL (ref 8–23)
BUN: 22 mg/dL (ref 8–23)
Calcium, Ion: 1.26 mmol/L (ref 1.15–1.40)
Calcium, Ion: 1.31 mmol/L (ref 1.15–1.40)
Calcium, Ion: 1.13 mmol/L — ABNORMAL LOW (ref 1.15–1.40)
Calcium, Ion: 1.11 mmol/L — ABNORMAL LOW (ref 1.15–1.40)
Chloride: 104 mmol/L (ref 98–111)
Chloride: 103 mmol/L (ref 98–111)
Chloride: 101 mmol/L (ref 98–111)
Chloride: 103 mmol/L (ref 98–111)
Creatinine, Ser: 1.1 mg/dL (ref 0.61–1.24)
Creatinine, Ser: 1.1 mg/dL (ref 0.61–1.24)
Creatinine, Ser: 1 mg/dL (ref 0.61–1.24)
Creatinine, Ser: 1 mg/dL (ref 0.61–1.24)
Glucose, Bld: 104 mg/dL — ABNORMAL HIGH (ref 70–99)
Glucose, Bld: 106 mg/dL — ABNORMAL HIGH (ref 70–99)
Glucose, Bld: 120 mg/dL — ABNORMAL HIGH (ref 70–99)
Glucose, Bld: 132 mg/dL — ABNORMAL HIGH (ref 70–99)
HCT: 34 % — ABNORMAL LOW (ref 39.0–52.0)
HCT: 36 % — ABNORMAL LOW (ref 39.0–52.0)
HCT: 26 % — ABNORMAL LOW (ref 39.0–52.0)
HCT: 26 % — ABNORMAL LOW (ref 39.0–52.0)
Hemoglobin: 11.6 g/dL — ABNORMAL LOW (ref 13.0–17.0)
Hemoglobin: 12.2 g/dL — ABNORMAL LOW (ref 13.0–17.0)
Hemoglobin: 8.8 g/dL — ABNORMAL LOW (ref 13.0–17.0)
Hemoglobin: 8.8 g/dL — ABNORMAL LOW (ref 13.0–17.0)
Potassium: 4 mmol/L (ref 3.5–5.1)
Potassium: 4.5 mmol/L (ref 3.5–5.1)
Potassium: 5 mmol/L (ref 3.5–5.1)
Potassium: 5.5 mmol/L — ABNORMAL HIGH (ref 3.5–5.1)
Sodium: 138 mmol/L (ref 135–145)
Sodium: 137 mmol/L (ref 135–145)
Sodium: 134 mmol/L — ABNORMAL LOW (ref 135–145)
Sodium: 133 mmol/L — ABNORMAL LOW (ref 135–145)
TCO2: 28 mmol/L (ref 22–32)
TCO2: 27 mmol/L (ref 22–32)
TCO2: 26 mmol/L (ref 22–32)
TCO2: 25 mmol/L (ref 22–32)

## 2020-11-05 LAB — POCT I-STAT 7, (LYTES, BLD GAS, ICA,H+H)
Acid-Base Excess: 0 mmol/L (ref 0.0–2.0)
Acid-base deficit: 1 mmol/L (ref 0.0–2.0)
Acid-base deficit: 5 mmol/L — ABNORMAL HIGH (ref 0.0–2.0)
Acid-base deficit: 4 mmol/L — ABNORMAL HIGH (ref 0.0–2.0)
Acid-base deficit: 8 mmol/L — ABNORMAL HIGH (ref 0.0–2.0)
Bicarbonate: 26.4 mmol/L (ref 20.0–28.0)
Bicarbonate: 26.4 mmol/L (ref 20.0–28.0)
Bicarbonate: 20.2 mmol/L (ref 20.0–28.0)
Bicarbonate: 21.1 mmol/L (ref 20.0–28.0)
Bicarbonate: 17.2 mmol/L — ABNORMAL LOW (ref 20.0–28.0)
Calcium, Ion: 1.32 mmol/L (ref 1.15–1.40)
Calcium, Ion: 0.99 mmol/L — ABNORMAL LOW (ref 1.15–1.40)
Calcium, Ion: 1.22 mmol/L (ref 1.15–1.40)
Calcium, Ion: 1.23 mmol/L (ref 1.15–1.40)
Calcium, Ion: 1.17 mmol/L (ref 1.15–1.40)
HCT: 36 % — ABNORMAL LOW (ref 39.0–52.0)
HCT: 26 % — ABNORMAL LOW (ref 39.0–52.0)
HCT: 25 % — ABNORMAL LOW (ref 39.0–52.0)
HCT: 26 % — ABNORMAL LOW (ref 39.0–52.0)
HCT: 21 % — ABNORMAL LOW (ref 39.0–52.0)
Hemoglobin: 12.2 g/dL — ABNORMAL LOW (ref 13.0–17.0)
Hemoglobin: 8.8 g/dL — ABNORMAL LOW (ref 13.0–17.0)
Hemoglobin: 8.5 g/dL — ABNORMAL LOW (ref 13.0–17.0)
Hemoglobin: 8.8 g/dL — ABNORMAL LOW (ref 13.0–17.0)
Hemoglobin: 7.1 g/dL — ABNORMAL LOW (ref 13.0–17.0)
O2 Saturation: 100 %
O2 Saturation: 100 %
O2 Saturation: 99 %
O2 Saturation: 98 %
O2 Saturation: 95 %
Patient temperature: 35.4
Patient temperature: 37.1
Patient temperature: 37.3
Potassium: 4 mmol/L (ref 3.5–5.1)
Potassium: 4.9 mmol/L (ref 3.5–5.1)
Potassium: 4.2 mmol/L (ref 3.5–5.1)
Potassium: 4.8 mmol/L (ref 3.5–5.1)
Potassium: 4 mmol/L (ref 3.5–5.1)
Sodium: 138 mmol/L (ref 135–145)
Sodium: 138 mmol/L (ref 135–145)
Sodium: 139 mmol/L (ref 135–145)
Sodium: 138 mmol/L (ref 135–145)
Sodium: 138 mmol/L (ref 135–145)
TCO2: 28 mmol/L (ref 22–32)
TCO2: 28 mmol/L (ref 22–32)
TCO2: 21 mmol/L — ABNORMAL LOW (ref 22–32)
TCO2: 22 mmol/L (ref 22–32)
TCO2: 18 mmol/L — ABNORMAL LOW (ref 22–32)
pCO2 arterial: 55.9 mmHg — ABNORMAL HIGH (ref 32.0–48.0)
pCO2 arterial: 48.7 mmHg — ABNORMAL HIGH (ref 32.0–48.0)
pCO2 arterial: 35.6 mmHg (ref 32.0–48.0)
pCO2 arterial: 39.9 mmHg (ref 32.0–48.0)
pCO2 arterial: 33.2 mmHg (ref 32.0–48.0)
pH, Arterial: 7.283 — ABNORMAL LOW (ref 7.350–7.450)
pH, Arterial: 7.342 — ABNORMAL LOW (ref 7.350–7.450)
pH, Arterial: 7.355 (ref 7.350–7.450)
pH, Arterial: 7.331 — ABNORMAL LOW (ref 7.350–7.450)
pH, Arterial: 7.325 — ABNORMAL LOW (ref 7.350–7.450)
pO2, Arterial: 388 mmHg — ABNORMAL HIGH (ref 83.0–108.0)
pO2, Arterial: 429 mmHg — ABNORMAL HIGH (ref 83.0–108.0)
pO2, Arterial: 135 mmHg — ABNORMAL HIGH (ref 83.0–108.0)
pO2, Arterial: 117 mmHg — ABNORMAL HIGH (ref 83.0–108.0)
pO2, Arterial: 80 mmHg — ABNORMAL LOW (ref 83.0–108.0)

## 2020-11-05 LAB — CBC
HCT: 35.2 % — ABNORMAL LOW (ref 39.0–52.0)
HCT: 27.5 % — ABNORMAL LOW (ref 39.0–52.0)
HCT: 26.5 % — ABNORMAL LOW (ref 39.0–52.0)
Hemoglobin: 11.9 g/dL — ABNORMAL LOW (ref 13.0–17.0)
Hemoglobin: 9.1 g/dL — ABNORMAL LOW (ref 13.0–17.0)
Hemoglobin: 8.7 g/dL — ABNORMAL LOW (ref 13.0–17.0)
MCH: 32.6 pg (ref 26.0–34.0)
MCH: 32.9 pg (ref 26.0–34.0)
MCH: 32.3 pg (ref 26.0–34.0)
MCHC: 33.8 g/dL (ref 30.0–36.0)
MCHC: 33.1 g/dL (ref 30.0–36.0)
MCHC: 32.8 g/dL (ref 30.0–36.0)
MCV: 96.4 fL (ref 80.0–100.0)
MCV: 99.3 fL (ref 80.0–100.0)
MCV: 98.5 fL (ref 80.0–100.0)
Platelets: 184 10*3/uL (ref 150–400)
Platelets: 111 10*3/uL — ABNORMAL LOW (ref 150–400)
Platelets: 132 10*3/uL — ABNORMAL LOW (ref 150–400)
RBC: 3.65 MIL/uL — ABNORMAL LOW (ref 4.22–5.81)
RBC: 2.77 MIL/uL — ABNORMAL LOW (ref 4.22–5.81)
RBC: 2.69 MIL/uL — ABNORMAL LOW (ref 4.22–5.81)
RDW: 14.9 % (ref 11.5–15.5)
RDW: 14.7 % (ref 11.5–15.5)
RDW: 14.8 % (ref 11.5–15.5)
WBC: 3.9 10*3/uL — ABNORMAL LOW (ref 4.0–10.5)
WBC: 6 10*3/uL (ref 4.0–10.5)
WBC: 6.7 10*3/uL (ref 4.0–10.5)
nRBC: 0 % (ref 0.0–0.2)
nRBC: 0 % (ref 0.0–0.2)
nRBC: 0 % (ref 0.0–0.2)

## 2020-11-05 LAB — HEMOGLOBIN AND HEMATOCRIT, BLOOD
HCT: 25.8 % — ABNORMAL LOW (ref 39.0–52.0)
Hemoglobin: 8.7 g/dL — ABNORMAL LOW (ref 13.0–17.0)

## 2020-11-05 LAB — POCT I-STAT EG7
Acid-base deficit: 1 mmol/L (ref 0.0–2.0)
Bicarbonate: 25.5 mmol/L (ref 20.0–28.0)
Calcium, Ion: 1.08 mmol/L — ABNORMAL LOW (ref 1.15–1.40)
HCT: 26 % — ABNORMAL LOW (ref 39.0–52.0)
Hemoglobin: 8.8 g/dL — ABNORMAL LOW (ref 13.0–17.0)
O2 Saturation: 81 %
Potassium: 4.9 mmol/L (ref 3.5–5.1)
Sodium: 138 mmol/L (ref 135–145)
TCO2: 27 mmol/L (ref 22–32)
pCO2, Ven: 50.8 mmHg (ref 44.0–60.0)
pH, Ven: 7.309 (ref 7.250–7.430)
pO2, Ven: 50 mmHg — ABNORMAL HIGH (ref 32.0–45.0)

## 2020-11-05 LAB — COMPREHENSIVE METABOLIC PANEL
ALT: 103 U/L — ABNORMAL HIGH (ref 0–44)
AST: 79 U/L — ABNORMAL HIGH (ref 15–41)
Albumin: 3.6 g/dL (ref 3.5–5.0)
Alkaline Phosphatase: 49 U/L (ref 38–126)
Anion gap: 9 (ref 5–15)
BUN: 26 mg/dL — ABNORMAL HIGH (ref 8–23)
CO2: 25 mmol/L (ref 22–32)
Calcium: 9.7 mg/dL (ref 8.9–10.3)
Chloride: 103 mmol/L (ref 98–111)
Creatinine, Ser: 1.22 mg/dL (ref 0.61–1.24)
GFR, Estimated: >60 mL/min (ref >60)
Glucose, Bld: 89 mg/dL (ref 70–99)
Potassium: 4 mmol/L (ref 3.5–5.1)
Sodium: 137 mmol/L (ref 135–145)
Total Bilirubin: 0.3 mg/dL (ref 0.3–1.2)
Total Protein: 6.4 g/dL — ABNORMAL LOW (ref 6.5–8.1)

## 2020-11-05 LAB — BASIC METABOLIC PANEL
Anion gap: 6 (ref 5–15)
BUN: 19 mg/dL (ref 8–23)
CO2: 21 mmol/L — ABNORMAL LOW (ref 22–32)
Calcium: 8.5 mg/dL — ABNORMAL LOW (ref 8.9–10.3)
Chloride: 106 mmol/L (ref 98–111)
Creatinine, Ser: 1.08 mg/dL (ref 0.61–1.24)
GFR, Estimated: >60 mL/min (ref >60)
Glucose, Bld: 113 mg/dL — ABNORMAL HIGH (ref 70–99)
Potassium: 4.9 mmol/L (ref 3.5–5.1)
Sodium: 133 mmol/L — ABNORMAL LOW (ref 135–145)

## 2020-11-05 LAB — HEPARIN LEVEL (UNFRACTIONATED): Heparin Unfractionated: 0.65 IU/mL (ref 0.30–0.70)

## 2020-11-05 LAB — GLUCOSE, CAPILLARY
Glucose-Capillary: 101 mg/dL — ABNORMAL HIGH (ref 70–99)
Glucose-Capillary: 104 mg/dL — ABNORMAL HIGH (ref 70–99)
Glucose-Capillary: 107 mg/dL — ABNORMAL HIGH (ref 70–99)
Glucose-Capillary: 110 mg/dL — ABNORMAL HIGH (ref 70–99)
Glucose-Capillary: 111 mg/dL — ABNORMAL HIGH (ref 70–99)
Glucose-Capillary: 114 mg/dL — ABNORMAL HIGH (ref 70–99)
Glucose-Capillary: 115 mg/dL — ABNORMAL HIGH (ref 70–99)

## 2020-11-05 LAB — APTT: aPTT: 48 seconds — ABNORMAL HIGH (ref 24–36)

## 2020-11-05 LAB — PLATELET COUNT: Platelets: 149 10*3/uL — ABNORMAL LOW (ref 150–400)

## 2020-11-05 LAB — MAGNESIUM: Magnesium: 3.1 mg/dL — ABNORMAL HIGH (ref 1.7–2.4)

## 2020-11-05 LAB — PROTIME-INR
INR: 1.3 — ABNORMAL HIGH (ref 0.8–1.2)
Prothrombin Time: 16.3 seconds — ABNORMAL HIGH (ref 11.4–15.2)

## 2020-11-05 SURGERY — CORONARY ARTERY BYPASS GRAFTING (CABG)
Anesthesia: General | Site: Chest

## 2020-11-05 MED ORDER — SODIUM CHLORIDE 0.9% FLUSH
10.0000 mL | Freq: Two times a day (BID) | INTRAVENOUS | Status: DC
  Administered 2020-11-05 – 2020-11-10 (×10): 10 mL

## 2020-11-05 MED ORDER — SODIUM CHLORIDE (PF) 0.9 % IJ SOLN
OROMUCOSAL | Status: DC | PRN
  Administered 2020-11-05 (×3): 4 mL via TOPICAL

## 2020-11-05 MED ORDER — SODIUM CHLORIDE 0.9% FLUSH
3.0000 mL | Freq: Two times a day (BID) | INTRAVENOUS | Status: DC
  Administered 2020-11-06 – 2020-11-13 (×13): 3 mL via INTRAVENOUS

## 2020-11-05 MED ORDER — LACTATED RINGERS IV SOLN: INTRAVENOUS | Status: DC

## 2020-11-05 MED ORDER — PANTOPRAZOLE SODIUM 40 MG PO TBEC
40.0000 mg | DELAYED_RELEASE_TABLET | Freq: Every day | ORAL | Status: DC
  Administered 2020-11-07 – 2020-11-14 (×8): 40 mg via ORAL
  Filled 2020-11-05 (×8): qty 1

## 2020-11-05 MED ORDER — VASOPRESSIN 20 UNITS/100 ML INFUSION FOR SHOCK
0.0000 [IU]/min | INTRAVENOUS | Status: DC
  Filled 2020-11-05: qty 100

## 2020-11-05 MED ORDER — 0.9 % SODIUM CHLORIDE (POUR BTL) OPTIME
TOPICAL | Status: DC | PRN
  Administered 2020-11-05: 4000 mL

## 2020-11-05 MED ORDER — NITROGLYCERIN IN D5W 200-5 MCG/ML-% IV SOLN: 0.0000 ug/min | INTRAVENOUS | Status: DC

## 2020-11-05 MED ORDER — HEPARIN SODIUM (PORCINE) 1000 UNIT/ML IJ SOLN
INTRAMUSCULAR | Status: DC | PRN
  Administered 2020-11-05: 24000 [IU] via INTRAVENOUS

## 2020-11-05 MED ORDER — LACTATED RINGERS IV SOLN: 500.0000 mL | Freq: Once | INTRAVENOUS | Status: DC | PRN

## 2020-11-05 MED ORDER — PROTAMINE SULFATE 10 MG/ML IV SOLN
INTRAVENOUS | Status: DC | PRN
  Administered 2020-11-05: 240 mg via INTRAVENOUS

## 2020-11-05 MED ORDER — ALBUMIN HUMAN 5 % IV SOLN
250.0000 mL | INTRAVENOUS | Status: AC | PRN
  Administered 2020-11-05 (×2): 12.5 g via INTRAVENOUS

## 2020-11-05 MED ORDER — PHENYLEPHRINE 40 MCG/ML (10ML) SYRINGE FOR IV PUSH (FOR BLOOD PRESSURE SUPPORT)
PREFILLED_SYRINGE | INTRAVENOUS | Status: DC | PRN
  Administered 2020-11-05: 40 ug via INTRAVENOUS

## 2020-11-05 MED ORDER — DOCUSATE SODIUM 100 MG PO CAPS
200.0000 mg | ORAL_CAPSULE | Freq: Every day | ORAL | Status: DC
  Administered 2020-11-06 – 2020-11-13 (×4): 200 mg via ORAL
  Filled 2020-11-05 (×9): qty 2

## 2020-11-05 MED ORDER — ACETAMINOPHEN 160 MG/5ML PO SOLN: 650.0000 mg | Freq: Once | ORAL | Status: AC

## 2020-11-05 MED ORDER — MIDAZOLAM HCL 5 MG/5ML IJ SOLN
INTRAMUSCULAR | Status: DC | PRN
  Administered 2020-11-05: 1 mg via INTRAVENOUS
  Administered 2020-11-05: 3 mg via INTRAVENOUS
  Administered 2020-11-05 (×4): 1 mg via INTRAVENOUS
  Administered 2020-11-05: 2 mg via INTRAVENOUS

## 2020-11-05 MED ORDER — POTASSIUM CHLORIDE 10 MEQ/50ML IV SOLN: 10.0000 meq | INTRAVENOUS | Status: AC

## 2020-11-05 MED ORDER — METOPROLOL TARTRATE 12.5 MG HALF TABLET
12.5000 mg | ORAL_TABLET | Freq: Two times a day (BID) | ORAL | Status: DC
  Filled 2020-11-05 (×2): qty 1

## 2020-11-05 MED ORDER — ACETAMINOPHEN 650 MG RE SUPP
650.0000 mg | Freq: Once | RECTAL | Status: AC
  Administered 2020-11-05: 650 mg via RECTAL

## 2020-11-05 MED ORDER — ACETAMINOPHEN 500 MG PO TABS
1000.0000 mg | ORAL_TABLET | Freq: Four times a day (QID) | ORAL | Status: AC
  Administered 2020-11-06 – 2020-11-10 (×17): 1000 mg via ORAL
  Filled 2020-11-05 (×18): qty 2

## 2020-11-05 MED ORDER — LIDOCAINE 2% (20 MG/ML) 5 ML SYRINGE
INTRAMUSCULAR | Status: AC
  Filled 2020-11-05: qty 5

## 2020-11-05 MED ORDER — LACTATED RINGERS IV SOLN: INTRAVENOUS | Status: DC | PRN

## 2020-11-05 MED ORDER — METOPROLOL TARTRATE 5 MG/5ML IV SOLN
2.5000 mg | INTRAVENOUS | Status: DC | PRN
Start: 2020-11-05 — End: 2020-11-14

## 2020-11-05 MED ORDER — ETOMIDATE 2 MG/ML IV SOLN
INTRAVENOUS | Status: AC
  Filled 2020-11-05: qty 10

## 2020-11-05 MED ORDER — DOBUTAMINE IN D5W 4-5 MG/ML-% IV SOLN
1.5000 ug/kg/min | INTRAVENOUS | Status: DC
  Administered 2020-11-07: 2.5 ug/kg/min via INTRAVENOUS
  Filled 2020-11-05: qty 250

## 2020-11-05 MED ORDER — ROCURONIUM BROMIDE 10 MG/ML (PF) SYRINGE
PREFILLED_SYRINGE | INTRAVENOUS | Status: DC | PRN
  Administered 2020-11-05: 20 mg via INTRAVENOUS
  Administered 2020-11-05 (×2): 50 mg via INTRAVENOUS
  Administered 2020-11-05: 30 mg via INTRAVENOUS
  Administered 2020-11-05: 70 mg via INTRAVENOUS

## 2020-11-05 MED ORDER — MAGNESIUM SULFATE 4 GM/100ML IV SOLN
4.0000 g | Freq: Once | INTRAVENOUS | Status: AC
  Administered 2020-11-05: 4 g via INTRAVENOUS
  Filled 2020-11-05: qty 100

## 2020-11-05 MED ORDER — INSULIN ASPART 100 UNIT/ML IJ SOLN
0.0000 [IU] | INTRAMUSCULAR | Status: DC
  Administered 2020-11-06 – 2020-11-07 (×4): 2 [IU] via SUBCUTANEOUS

## 2020-11-05 MED ORDER — MORPHINE SULFATE (PF) 2 MG/ML IV SOLN
1.0000 mg | INTRAVENOUS | Status: DC | PRN
  Administered 2020-11-05: 2 mg via INTRAVENOUS
  Filled 2020-11-05: qty 1

## 2020-11-05 MED ORDER — VASOPRESSIN 20 UNITS/100 ML INFUSION FOR SHOCK: 0.0000 [IU]/min | INTRAVENOUS | Status: DC

## 2020-11-05 MED ORDER — PLASMA-LYTE 148 IV SOLN
INTRAVENOUS | Status: DC | PRN
  Administered 2020-11-05: 500 mL

## 2020-11-05 MED ORDER — BISACODYL 10 MG RE SUPP: 10.0000 mg | Freq: Every day | RECTAL | Status: DC

## 2020-11-05 MED ORDER — CHLORHEXIDINE GLUCONATE 0.12 % MT SOLN
15.0000 mL | OROMUCOSAL | Status: AC
  Administered 2020-11-05: 15 mL via OROMUCOSAL

## 2020-11-05 MED ORDER — ASPIRIN EC 325 MG PO TBEC
325.0000 mg | DELAYED_RELEASE_TABLET | Freq: Every day | ORAL | Status: DC
  Administered 2020-11-06 – 2020-11-12 (×6): 325 mg via ORAL
  Filled 2020-11-05 (×7): qty 1

## 2020-11-05 MED ORDER — FENTANYL CITRATE (PF) 250 MCG/5ML IJ SOLN
INTRAMUSCULAR | Status: AC
  Filled 2020-11-05: qty 25

## 2020-11-05 MED ORDER — PHENYLEPHRINE HCL-NACL 20-0.9 MG/250ML-% IV SOLN: 0.0000 ug/min | INTRAVENOUS | Status: DC

## 2020-11-05 MED ORDER — PROPOFOL 10 MG/ML IV BOLUS
INTRAVENOUS | Status: DC | PRN
  Administered 2020-11-05: 20 mg via INTRAVENOUS
  Administered 2020-11-05: 5 mg via INTRAVENOUS
  Administered 2020-11-05: 10 mg via INTRAVENOUS
  Administered 2020-11-05 (×2): 20 mg via INTRAVENOUS
  Administered 2020-11-05: 15 mg via INTRAVENOUS
  Administered 2020-11-05 (×2): 10 mg via INTRAVENOUS

## 2020-11-05 MED ORDER — METOPROLOL TARTRATE 25 MG/10 ML ORAL SUSPENSION: 12.5000 mg | Freq: Two times a day (BID) | ORAL | Status: DC

## 2020-11-05 MED ORDER — ROCURONIUM BROMIDE 10 MG/ML (PF) SYRINGE
PREFILLED_SYRINGE | INTRAVENOUS | Status: AC
  Filled 2020-11-05: qty 10

## 2020-11-05 MED ORDER — EPINEPHRINE HCL 5 MG/250ML IV SOLN IN NS: 0.0000 ug/min | INTRAVENOUS | Status: DC

## 2020-11-05 MED ORDER — FENTANYL CITRATE (PF) 250 MCG/5ML IJ SOLN
INTRAMUSCULAR | Status: DC | PRN
  Administered 2020-11-05: 125 ug via INTRAVENOUS
  Administered 2020-11-05 (×3): 100 ug via INTRAVENOUS
  Administered 2020-11-05: 25 ug via INTRAVENOUS
  Administered 2020-11-05: 100 ug via INTRAVENOUS
  Administered 2020-11-05: 50 ug via INTRAVENOUS

## 2020-11-05 MED ORDER — FAMOTIDINE IN NACL 20-0.9 MG/50ML-% IV SOLN
20.0000 mg | Freq: Two times a day (BID) | INTRAVENOUS | Status: AC
  Administered 2020-11-05 (×2): 20 mg via INTRAVENOUS
  Filled 2020-11-05 (×2): qty 50

## 2020-11-05 MED ORDER — PROPOFOL 10 MG/ML IV BOLUS
INTRAVENOUS | Status: AC
  Filled 2020-11-05: qty 20

## 2020-11-05 MED ORDER — METOCLOPRAMIDE HCL 5 MG/ML IJ SOLN
10.0000 mg | Freq: Four times a day (QID) | INTRAMUSCULAR | Status: AC
  Administered 2020-11-06 (×2): 10 mg via INTRAVENOUS
  Filled 2020-11-05 (×2): qty 2

## 2020-11-05 MED ORDER — ONDANSETRON HCL 4 MG/2ML IJ SOLN
4.0000 mg | Freq: Four times a day (QID) | INTRAMUSCULAR | Status: DC | PRN
  Administered 2020-11-07: 4 mg via INTRAVENOUS
  Filled 2020-11-05: qty 2

## 2020-11-05 MED ORDER — NOREPINEPHRINE 4 MG/250ML-% IV SOLN
0.0000 ug/min | INTRAVENOUS | Status: DC
  Administered 2020-11-06: 9 ug/min via INTRAVENOUS
  Administered 2020-11-06 (×2): 8 ug/min via INTRAVENOUS
  Administered 2020-11-07: 5 ug/min via INTRAVENOUS
  Administered 2020-11-07: 4 ug/min via INTRAVENOUS
  Filled 2020-11-05 (×5): qty 250

## 2020-11-05 MED ORDER — BISACODYL 5 MG PO TBEC
10.0000 mg | DELAYED_RELEASE_TABLET | Freq: Every day | ORAL | Status: DC
  Administered 2020-11-06 – 2020-11-08 (×3): 10 mg via ORAL
  Filled 2020-11-05 (×8): qty 2

## 2020-11-05 MED ORDER — MILRINONE LACTATE IN DEXTROSE 20-5 MG/100ML-% IV SOLN: 0.3000 ug/kg/min | INTRAVENOUS | Status: DC

## 2020-11-05 MED ORDER — VANCOMYCIN HCL IN DEXTROSE 1-5 GM/200ML-% IV SOLN
1000.0000 mg | Freq: Once | INTRAVENOUS | Status: AC
  Administered 2020-11-05: 1000 mg via INTRAVENOUS
  Filled 2020-11-05: qty 200

## 2020-11-05 MED ORDER — SODIUM CHLORIDE 0.9 % IV SOLN: 250.0000 mL | INTRAVENOUS | Status: DC

## 2020-11-05 MED ORDER — SODIUM CHLORIDE 0.9% FLUSH: 3.0000 mL | INTRAVENOUS | Status: DC | PRN

## 2020-11-05 MED ORDER — SODIUM CHLORIDE 0.45 % IV SOLN: INTRAVENOUS | Status: DC | PRN

## 2020-11-05 MED ORDER — ALBUMIN HUMAN 5 % IV SOLN: INTRAVENOUS | Status: DC | PRN

## 2020-11-05 MED ORDER — CHLORHEXIDINE GLUCONATE CLOTH 2 % EX PADS
6.0000 | MEDICATED_PAD | Freq: Every day | CUTANEOUS | Status: DC
  Administered 2020-11-05 – 2020-11-14 (×7): 6 via TOPICAL

## 2020-11-05 MED ORDER — MIDAZOLAM HCL 2 MG/2ML IJ SOLN: 2.0000 mg | INTRAMUSCULAR | Status: DC | PRN

## 2020-11-05 MED ORDER — DEXMEDETOMIDINE HCL IN NACL 400 MCG/100ML IV SOLN: 0.0000 ug/kg/h | INTRAVENOUS | Status: DC

## 2020-11-05 MED ORDER — LEVOFLOXACIN IN D5W 750 MG/150ML IV SOLN
750.0000 mg | INTRAVENOUS | Status: AC
  Administered 2020-11-06: 750 mg via INTRAVENOUS
  Filled 2020-11-05: qty 150

## 2020-11-05 MED ORDER — TRAMADOL HCL 50 MG PO TABS
50.0000 mg | ORAL_TABLET | ORAL | Status: DC | PRN
  Administered 2020-11-05: 100 mg via ORAL
  Administered 2020-11-06 – 2020-11-07 (×2): 50 mg via ORAL
  Administered 2020-11-07 – 2020-11-09 (×5): 100 mg via ORAL
  Administered 2020-11-09 (×2): 50 mg via ORAL
  Filled 2020-11-05: qty 2
  Filled 2020-11-05 (×2): qty 1
  Filled 2020-11-05 (×4): qty 2
  Filled 2020-11-05: qty 1
  Filled 2020-11-05 (×2): qty 2

## 2020-11-05 MED ORDER — OXYCODONE HCL 5 MG PO TABS
5.0000 mg | ORAL_TABLET | ORAL | Status: DC | PRN
  Administered 2020-11-05 – 2020-11-06 (×2): 10 mg via ORAL
  Administered 2020-11-11: 5 mg via ORAL
  Administered 2020-11-11 – 2020-11-14 (×2): 10 mg via ORAL
  Filled 2020-11-05: qty 2
  Filled 2020-11-05: qty 1
  Filled 2020-11-05 (×2): qty 2
  Filled 2020-11-05: qty 1
  Filled 2020-11-05: qty 2

## 2020-11-05 MED ORDER — LIDOCAINE 2% (20 MG/ML) 5 ML SYRINGE
INTRAMUSCULAR | Status: DC | PRN
  Administered 2020-11-05: 60 mg via INTRAVENOUS

## 2020-11-05 MED ORDER — DEXTROSE 50 % IV SOLN
0.0000 mL | INTRAVENOUS | Status: DC | PRN
Start: 2020-11-05 — End: 2020-11-05
  Filled 2020-11-05: qty 50

## 2020-11-05 MED ORDER — DOBUTAMINE IN D5W 4-5 MG/ML-% IV SOLN
2.5000 ug/kg/min | INTRAVENOUS | Status: AC
  Administered 2020-11-05: 2.5 ug/kg/min via INTRAVENOUS
  Filled 2020-11-05: qty 250

## 2020-11-05 MED ORDER — MIDAZOLAM HCL (PF) 10 MG/2ML IJ SOLN
INTRAMUSCULAR | Status: AC
  Filled 2020-11-05: qty 2

## 2020-11-05 MED ORDER — ACETAMINOPHEN 160 MG/5ML PO SOLN: 1000.0000 mg | Freq: Four times a day (QID) | ORAL | Status: AC

## 2020-11-05 MED ORDER — ASPIRIN 81 MG PO CHEW
324.0000 mg | CHEWABLE_TABLET | Freq: Every day | ORAL | Status: DC
  Administered 2020-11-11: 324 mg
  Filled 2020-11-05 (×3): qty 4

## 2020-11-05 MED ORDER — SODIUM CHLORIDE 0.9% FLUSH: 10.0000 mL | INTRAVENOUS | Status: DC | PRN

## 2020-11-05 MED ORDER — INSULIN REGULAR(HUMAN) IN NACL 100-0.9 UT/100ML-% IV SOLN: INTRAVENOUS | Status: DC

## 2020-11-05 MED ORDER — SODIUM CHLORIDE 0.9 % IV SOLN: INTRAVENOUS | Status: DC

## 2020-11-05 MED ORDER — HEPARIN SODIUM (PORCINE) 1000 UNIT/ML IJ SOLN
INTRAMUSCULAR | Status: AC
  Filled 2020-11-05: qty 1

## 2020-11-05 SURGICAL SUPPLY — 99 items
BAG DECANTER FOR FLEXI CONT (MISCELLANEOUS) ×3 IMPLANT
BLADE CLIPPER SURG (BLADE) ×3 IMPLANT
BLADE STERNUM SYSTEM 6 (BLADE) ×3 IMPLANT
BLADE SURG 11 STRL SS (BLADE) ×3 IMPLANT
BNDG ELASTIC 4X5.8 VLCR STR LF (GAUZE/BANDAGES/DRESSINGS) ×3 IMPLANT
BNDG ELASTIC 6X10 VLCR STRL LF (GAUZE/BANDAGES/DRESSINGS) ×3 IMPLANT
BNDG ELASTIC 6X5.8 VLCR STR LF (GAUZE/BANDAGES/DRESSINGS) ×3 IMPLANT
BNDG GAUZE ELAST 4 BULKY (GAUZE/BANDAGES/DRESSINGS) ×3 IMPLANT
CABLE SURGICAL S-101-97-12 (CABLE) ×3 IMPLANT
CANISTER SUCT 3000ML PPV (MISCELLANEOUS) ×3 IMPLANT
CANNULA MC2 2 STG 29/37 NON-V (CANNULA) ×2 IMPLANT
CANNULA MC2 TWO STAGE (CANNULA) ×1
CANNULA NON VENT 20FR 12 (CANNULA) ×3 IMPLANT
CATH ROBINSON RED A/P 18FR (CATHETERS) ×6 IMPLANT
CLIP RETRACTION 3.0MM CORONARY (MISCELLANEOUS) ×3 IMPLANT
CLIP VESOCCLUDE MED 24/CT (CLIP) IMPLANT
CLIP VESOCCLUDE SM WIDE 24/CT (CLIP) IMPLANT
CONN ST 1/2X1/2  BEN (MISCELLANEOUS) ×1
CONN ST 1/2X1/2 BEN (MISCELLANEOUS) ×2 IMPLANT
CONNECTOR BLAKE 2:1 CARIO BLK (MISCELLANEOUS) ×3 IMPLANT
CONTAINER PROTECT SURGISLUSH (MISCELLANEOUS) ×6 IMPLANT
DERMABOND ADVANCED (GAUZE/BANDAGES/DRESSINGS) ×2
DERMABOND ADVANCED .7 DNX12 (GAUZE/BANDAGES/DRESSINGS) ×4 IMPLANT
DRAIN CHANNEL 19F RND (DRAIN) ×9 IMPLANT
DRAIN CONNECTOR BLAKE 1:1 (MISCELLANEOUS) ×3 IMPLANT
DRAPE CARDIOVASCULAR INCISE (DRAPES) ×1
DRAPE INCISE IOBAN 66X45 STRL (DRAPES) IMPLANT
DRAPE SRG 135X102X78XABS (DRAPES) ×2 IMPLANT
DRAPE WARM FLUID 44X44 (DRAPES) ×3 IMPLANT
DRSG AQUACEL AG ADV 3.5X10 (GAUZE/BANDAGES/DRESSINGS) ×3 IMPLANT
DRSG AQUACEL AG ADV 3.5X14 (GAUZE/BANDAGES/DRESSINGS) ×3 IMPLANT
DRSG COVADERM 4X14 (GAUZE/BANDAGES/DRESSINGS) ×3 IMPLANT
ELECT BLADE 4.0 EZ CLEAN MEGAD (MISCELLANEOUS) ×3
ELECT REM PT RETURN 9FT ADLT (ELECTROSURGICAL) ×6
ELECTRODE BLDE 4.0 EZ CLN MEGD (MISCELLANEOUS) ×2 IMPLANT
ELECTRODE REM PT RTRN 9FT ADLT (ELECTROSURGICAL) ×4 IMPLANT
FELT TEFLON 1X6 (MISCELLANEOUS) ×6 IMPLANT
GAUZE SPONGE 4X4 12PLY STRL (GAUZE/BANDAGES/DRESSINGS) ×9 IMPLANT
GLOVE BIO SURGEON STRL SZ7 (GLOVE) ×6 IMPLANT
GLOVE BIOGEL M STRL SZ7.5 (GLOVE) ×6 IMPLANT
GLOVE SRG 8 PF TXTR STRL LF DI (GLOVE) ×2 IMPLANT
GLOVE SURG MICRO LTX SZ6.5 (GLOVE) ×3 IMPLANT
GLOVE SURG MICRO LTX SZ7 (GLOVE) ×6 IMPLANT
GLOVE SURG MICRO LTX SZ7.5 (GLOVE) ×6 IMPLANT
GLOVE SURG UNDER POLY LF SZ6 (GLOVE) ×6 IMPLANT
GLOVE SURG UNDER POLY LF SZ7 (GLOVE) ×3 IMPLANT
GLOVE SURG UNDER POLY LF SZ8 (GLOVE) ×1
GOWN STRL REUS W/ TWL LRG LVL3 (GOWN DISPOSABLE) ×16 IMPLANT
GOWN STRL REUS W/ TWL XL LVL3 (GOWN DISPOSABLE) ×4 IMPLANT
GOWN STRL REUS W/TWL LRG LVL3 (GOWN DISPOSABLE) ×8
GOWN STRL REUS W/TWL XL LVL3 (GOWN DISPOSABLE) ×2
HEMOSTAT POWDER SURGIFOAM 1G (HEMOSTASIS) ×9 IMPLANT
INSERT SUTURE HOLDER (MISCELLANEOUS) ×3 IMPLANT
IV ADAPTER SYR DOUBLE MALE LL (MISCELLANEOUS) ×3 IMPLANT
KIT BASIN OR (CUSTOM PROCEDURE TRAY) ×3 IMPLANT
KIT MICROPUNCTURE NIT STIFF (SHEATH) ×3 IMPLANT
KIT SUCTION CATH 14FR (SUCTIONS) ×3 IMPLANT
KIT TURNOVER KIT B (KITS) ×3 IMPLANT
KIT VASOVIEW HEMOPRO 2 VH 4000 (KITS) ×3 IMPLANT
LEAD PACING MYOCARDI (MISCELLANEOUS) ×3 IMPLANT
MARKER GRAFT CORONARY BYPASS (MISCELLANEOUS) ×9 IMPLANT
NS IRRIG 1000ML POUR BTL (IV SOLUTION) ×15 IMPLANT
PACK ACCESSORY CANNULA KIT (KITS) ×3 IMPLANT
PACK E OPEN HEART (SUTURE) ×3 IMPLANT
PACK OPEN HEART (CUSTOM PROCEDURE TRAY) ×3 IMPLANT
PAD ARMBOARD 7.5X6 YLW CONV (MISCELLANEOUS) ×6 IMPLANT
PAD ELECT DEFIB RADIOL ZOLL (MISCELLANEOUS) ×3 IMPLANT
PENCIL BUTTON HOLSTER BLD 10FT (ELECTRODE) ×3 IMPLANT
POSITIONER HEAD DONUT 9IN (MISCELLANEOUS) ×3 IMPLANT
PUNCH AORTIC ROTATE 4.0MM (MISCELLANEOUS) ×3 IMPLANT
SET CARDIOPLEGIA MPS 5001102 (MISCELLANEOUS) ×3 IMPLANT
SOL ANTI FOG 6CC (MISCELLANEOUS) ×2 IMPLANT
SOLUTION ANTI FOG 6CC (MISCELLANEOUS) ×1
STOPCOCK 4 WAY LG BORE MALE ST (IV SETS) ×3 IMPLANT
SUPPORT HEART JANKE-BARRON (MISCELLANEOUS) ×3 IMPLANT
SUT BONE WAX W31G (SUTURE) ×3 IMPLANT
SUT ETHIBOND X763 2 0 SH 1 (SUTURE) ×6 IMPLANT
SUT ETHILON 3 0 FSL (SUTURE) ×3 IMPLANT
SUT MNCRL AB 3-0 PS2 18 (SUTURE) ×6 IMPLANT
SUT MNCRL AB 4-0 PS2 18 (SUTURE) ×6 IMPLANT
SUT PDS AB 1 CTX 36 (SUTURE) ×6 IMPLANT
SUT PROLENE 4 0 SH DA (SUTURE) ×3 IMPLANT
SUT PROLENE 5 0 C 1 36 (SUTURE) ×15 IMPLANT
SUT PROLENE 7 0 BV 1 (SUTURE) ×3 IMPLANT
SUT PROLENE 7 0 BV1 MDA (SUTURE) ×6 IMPLANT
SUT STEEL 6MS V (SUTURE) ×12 IMPLANT
SUT VIC AB 2-0 CT1 27 (SUTURE) ×1
SUT VIC AB 2-0 CT1 TAPERPNT 27 (SUTURE) ×2 IMPLANT
SYSTEM SAHARA CHEST DRAIN ATS (WOUND CARE) ×3 IMPLANT
TAPE CLOTH SURG 4X10 WHT LF (GAUZE/BANDAGES/DRESSINGS) ×6 IMPLANT
TAPE PAPER 2X10 WHT MICROPORE (GAUZE/BANDAGES/DRESSINGS) ×3 IMPLANT
TOWEL GREEN STERILE (TOWEL DISPOSABLE) ×3 IMPLANT
TOWEL GREEN STERILE FF (TOWEL DISPOSABLE) ×3 IMPLANT
TRAY CATH LUMEN 1 20CM STRL (SET/KITS/TRAYS/PACK) ×3 IMPLANT
TRAY FOLEY SLVR 16FR TEMP STAT (SET/KITS/TRAYS/PACK) ×3 IMPLANT
TUBING ART PRESS 48 MALE/FEM (TUBING) ×6 IMPLANT
TUBING LAP HI FLOW INSUFFLATIO (TUBING) ×3 IMPLANT
UNDERPAD 30X36 HEAVY ABSORB (UNDERPADS AND DIAPERS) ×3 IMPLANT
WATER STERILE IRR 1000ML POUR (IV SOLUTION) ×6 IMPLANT

## 2020-11-05 NOTE — Brief Op Note (Signed)
10/28/2020 - 11/05/2020  12:25 PM  PATIENT:  Troy Johnson  70 y.o. male  PRE-OPERATIVE DIAGNOSIS:  CAD  POST-OPERATIVE DIAGNOSIS:  CAD  PROCEDURE:  Procedure(s) with comments:  CORONARY ARTERY BYPASS GRAFTING x 4 -LIMA to LAD -SVG to DIAGONAL -SVG to OM -SVG to PDA  ENDOSCOPIC HARVEST GREATER SAPHENOUS VEIN -Left Leg  POSSIBLE BALLOON PUMP  TRANSESOPHAGEAL ECHOCARDIOGRAM (TEE) (N/A)  SURGEON:  Surgeon(s) and Role:    * Lightfoot, Eliezer Lofts, MD - Primary  PHYSICIAN ASSISTANT: Lowella Dandy PA-C  ASSISTANTS: Tanda Rockers RNFA   ANESTHESIA:   general  EBL:  650 mL   BLOOD ADMINISTERED: CELLSAVER  DRAINS: Left Pleural Chest Tube, Mediastinal Chest Drains   LOCAL MEDICATIONS USED:  NONE  SPECIMEN:  No Specimen  DISPOSITION OF SPECIMEN:  N/A  COUNTS:  YES  TOURNIQUET:  * No tourniquets in log *  DICTATION: .Dragon Dictation  PLAN OF CARE: Admit to inpatient   PATIENT DISPOSITION:  ICU - intubated and hemodynamically stable.   Delay start of Pharmacological VTE agent (>24hrs) due to surgical blood loss or risk of bleeding: yes

## 2020-11-05 NOTE — Progress Notes (Signed)
ANTICOAGULATION CONSULT NOTE - Follow Up Consult  Pharmacy Consult for Heparin Indication: LV apical thrombus  Allergies  Allergen Reactions  . Penicillins Other (See Comments)    childhood    Patient Measurements: Height: 6\' 1"  (185.4 cm) Weight: 67.7 kg (149 lb 4.8 oz) IBW/kg (Calculated) : 79.9 kg Heparin Dosing Weight: 77 kg  Vital Signs: Temp: 97.6 F (36.4 C) (05/10 0534) Temp Source: Oral (05/10 0534) BP: 107/66 (05/10 0534) Pulse Rate: 51 (05/10 0725)  Labs: Recent Labs    11/03/20 0124 11/03/20 1858 11/04/20 0145 11/05/20 0516 11/05/20 0833 11/05/20 0958 11/05/20 1026 11/05/20 1058 11/05/20 1133 11/05/20 1149  HGB 11.5*  --  11.9* 11.9*   < > 12.2*   < > 8.8* 8.7* 8.8*  HCT 34.1*  --  35.4* 35.2*   < > 36.0*   < > 26.0* 25.8* 26.0*  PLT 225  --  209 184  --   --   --   --  149*  --   APTT  --   --  142*  --   --   --   --   --   --   --   LABPROT  --  13.7  --   --   --   --   --   --   --   --   INR  --  1.1  --   --   --   --   --   --   --   --   HEPARINUNFRC 0.39  --  0.65 0.65  --   --   --   --   --   --   CREATININE 1.24  --  1.20 1.22   < > 1.10  --  1.00  --  1.00   < > = values in this interval not displayed.    Estimated Creatinine Clearance: 66.8 mL/min (by C-G formula based on SCr of 1 mg/dL).   Medical History: Past Medical History:  Diagnosis Date  . Anemia   . Asthma    childhood  . Blood in stool   . Childhood asthma   . GI bleed   . Kidney laceration 1999  . Seizures [facility])    Assessment: 70 yo male presented with SOB and found to have mvCAD and an apical thrombus. PTA the patient is not on anticoagulation. The patient is tenatively scheduled for a CABG on 5/10. Pharmacy is consulted to dose heparin.   Heparin level is therapeutic at 0.65 while running at 1150 units/hr.  CBC is stable.    Heparin then turned off this AM for CABG.  Goal of Therapy:  Heparin level 0.3-0.7 units/ml Monitor platelets by anticoagulation  protocol: Yes   Plan:  -F/u p OR.  7/10, Reece Leader, BCCP Clinical Pharmacist  11/05/2020 12:44 PM   [facility] pharmacy phone numbers are listed on [email or web address]  .

## 2020-11-05 NOTE — Procedures (Signed)
Extubation Procedure Note  Patient Details:   Name: Troy Johnson DOB: [birth year] MRN: [id number]   Airway Documentation:    Vent end date: 11/05/20 Vent end time: 1746   Evaluation  O2 sats: stable throughout Complications: No apparent complications Patient did tolerate procedure well. Bilateral Breath Sounds: Clear   Yes   Pt extubated to 4L [place]. Pt able to speak, no stridor heard, positive cuff leak. Pt perfromed NIF and VC with great effort. Nif -40 and VC 1 L. 750 on incentive spirometer.   Memory Argue 11/05/2020, 5:58 PM

## 2020-11-05 NOTE — Hospital Course (Addendum)
History of Present Illness:  Patient is a 70 year old African American male with a past medical history of remote tobacco abuse, possible seizure, anemia/possibleGI bleed who presented to [facility] ED with complaints of progressive shortness of breath. Patient has had progressive shortness of breath, which began last Friday 04/29. He denies chest pain, coughing LE edema, fever. He also had orthopnea so he went to his primary care physician on 05/ 02. Initially, EMS called a code STEMI but Dr. Rosemary Holms reviewed the EKG and cancelled code STEMI. CXR showed borderline mild congestive heart failure and trace right pleural effusion and initial BNP was 1827. In addition, initial Troponin I (high sensitivity) was 656 and went up to 746. Patient did rule in a for a NSTEMI. Patient was given IV Lasix and Coreg. Patient underwent a cardiac catheterization on 10/29/2020 which showed a 90% proximal stenosis of the LAD, 95% stenosis in first Diagonal, a 95% stenosis of the ostial Circumflex to Proximal Circumflex, and an 80% proximal RCA stenosis with a 100% proximal to distal RCA stenosis, and a 70% stenosis of the RPDA. The patient was admitted for further care and cardiothoracic consultation was requested.  Hospital Course:  The patient was evaluated by Dr. Cliffton Asters for possible coronary bypass grafting.  Review of patient's imaging workup revealed the patient to have an EF of less than 20%.  There was also evidence of global hypokinesis with LV apical thrombus.  Due to this it was felt the patient would require a viability study prior to deciding if surgery was an option.  This was performed and showed evidence of viability.  Advanced heart failure team was consulted for medical optimization prior to proceeding with surgical intervention.  The patient remained chest pain free and was taken to the operating room on 11/05/2020.  He underwent CABG x 4 utilizing LIMA to LAD, SVG to PDA, SVG to OM, and SVG to Diagonal.   He also underwent endoscopic harvest of greater saphenous vein from his left leg.  He tolerated the procedure without difficulty and was taken to the SICU in stable condition.  He was extubated the evening of surgery.  Post operatively he required pressor/inotropic support with  Levophed and Dobutamine .  These were weaned off as hemodynamics allowed on 5/12 and 5/14.  His home digoxin was resumed.  His chest tubes were removed without difficulty.  He has remained hypotensive and was started on Midodrine.  He was started on Coumadin on 5/13 for his LV thrombus.  He was maintaining NSR with PVCs.  He was started on Farxiga.   He was transferred to the progressive care unit on 11/09/2020.  His BP improved and he was weaned off Midodrine with last dose on 5/16.

## 2020-11-05 NOTE — Anesthesia Procedure Notes (Signed)
Central Venous Catheter Insertion Performed by: Val Eagle, MD, anesthesiologist Start/End5/03/2021 7:19 AM, 11/05/2020 7:29 AM Patient location: Pre-op. Preanesthetic checklist: patient identified, IV checked, site marked, risks and benefits discussed, surgical consent, monitors and equipment checked, pre-op evaluation, timeout performed and anesthesia consent Hand hygiene performed  and maximum sterile barriers used  PA cath was placed.Swan type:thermodilution Procedure performed without using ultrasound guided technique. Attempts: 1 Patient tolerated the procedure well with no immediate complications.

## 2020-11-05 NOTE — Anesthesia Procedure Notes (Addendum)
Procedure Name: Intubation Date/Time: 11/05/2020 8:24 AM Performed by: Trinna Post., CRNA Pre-anesthesia Checklist: Patient identified, Emergency Drugs available, Suction available and Patient being monitored Patient Re-evaluated:Patient Re-evaluated prior to induction Oxygen Delivery Method: Circle system utilized Preoxygenation: Pre-oxygenation with 100% oxygen Induction Type: IV induction Ventilation: Mask ventilation without difficulty and Oral airway inserted - appropriate to patient size Laryngoscope Size: Sabra Heck and 3 Grade View: Grade I Tube type: Oral Tube size: 8.0 mm Number of attempts: 2 Airway Equipment and Method: Stylet and Oral airway Placement Confirmation: ETT inserted through vocal cords under direct vision,  positive ETCO2 and breath sounds checked- equal and bilateral Secured at: 23 cm Tube secured with: Tape Dental Injury: Teeth and Oropharynx as per pre-operative assessment  Comments: 1 DL with Mac 3, 1 DL Miller 3 by SRNA grade 3 view. MDA attempted with Sabra Heck 3 with success.

## 2020-11-05 NOTE — Transfer of Care (Signed)
Immediate Anesthesia Transfer of Care Note  Patient: Troy Johnson  Procedure(s) Performed: CORONARY ARTERY BYPASS GRAFTING (CABG) (N/A Chest) TRANSESOPHAGEAL ECHOCARDIOGRAM (TEE) (N/A )  Patient Location: ICU  Anesthesia Type:General  Level of Consciousness: Patient remains intubated per anesthesia plan  Airway & Oxygen Therapy: Patient remains intubated per anesthesia plan and Patient placed on Ventilator (see vital sign flow sheet for setting)  Post-op Assessment: Report given to RN and Post -op Vital signs reviewed and stable  Post vital signs: Reviewed and stable  Last Vitals:  Vitals Value Taken Time  BP 104/48 11/05/20 1354  Temp    Pulse 78 11/05/20 1354  Resp 12 11/05/20 1354  SpO2 98 % 11/05/20 1354    Last Pain:  Vitals:   11/05/20 0534  TempSrc: Oral  PainSc: 0-No pain      Patients Stated Pain Goal: 0 (11/03/20 1948)  Complications: No complications documented.

## 2020-11-05 NOTE — Telephone Encounter (Signed)
Advanced Heart Failure Patient Advocate Encounter  Prior Authorization for Marcelline Deist has been approved.    PA# [id number] Effective dates: 10/06/20 through 11/05/21  Patients co-pay is $35. Would suggest the use of co-pay card, to drop the co-pay more.  Archer Asa, CPhT

## 2020-11-05 NOTE — Progress Notes (Signed)
PROGRESS NOTE    Troy Johnson  QBH:[id number] DOB: [birth year] DOA: 10/28/2020 PCP: [name], MD    Chief Complaint  Patient presents with  . Shortness of Breath    Brief Narrative:  70 year old male with medical history of hypertension presents with shortness of breath.  Patient states that last Friday he started having shortness of breath initially had exertional dyspnea then developed orthopnea for past 2 nights.  Initial troponin was 656, potassium 5.9, chest x-ray showed cardiomegaly and pulmonary vascular congestion.  EKG showed ST depression T wave inversion V5 V6.  Cardiology was consulted, patient received 1 dose of Lasix with improvement in symptoms.  Heparin drip was started in the ED.  Patient went for cardiac catheterization which showed multivessel disease.  cardiothoracic surgery was consulted for CABG. Cardiac MRI ordered for evaluation of viability. PCCM consulted for assistance in pre op evaluation and care as pt will be high risk.  Pt scheduled for CABG today.    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 MRI showed viability and he is scheduled for CABG ON Tuesday.  - PCCM consulted for assistance in pre op evaluation and care.   Acute decompensated CHF:  Resolved He appears compensated at this time.  Patient currently denies any chest pain shortness of breath or pedal edema. Continue with digoxin, cozaar and spironolactone added to his regimen.  Continue with strict intake and output.  Patient started on Farxiga.  Creatinine stable at 1.2   Acute coronary syndrome:  - elevated troponin s secondary to demand ischemia.  - continue with IV heparin and aspirin.   -no chest pain or sob.    Hypertension:  Blood pressure parameters are optimal   Malnutrition of moderate degree.  Nutrition will be consulted.    Hyperlipidemia:  Continue with lipitor 80 mg daily.  LDL is 204   Stage 2 CKD:  Creatinine at baseline 1.3,  Mild anemia of chronic disease.  Hemoglobin around 11 and stable.    Leukopenia:  Unclear etiology.     DVT prophylaxis: HEPARIN.  Code Status: FULL CODE.  Family Communication: none at bedside.  Disposition:   Status is: Inpatient  Remains inpatient appropriate because:Ongoing diagnostic testing needed not appropriate for outpatient work up and IV treatments appropriate due to intensity of illness or inability to take PO   Dispo: The patient is from: Home              Anticipated d/c is to: Home              Patient currently is not medically stable to d/c.   Difficult to place patient No       Consultants:   CARDIOLOGY  Cardiothoracic surgery.   PCCM.    Procedures: cardiac MRI     Antimicrobials: none.    Subjective: Patient denies any chest pain or shortness of breath  Objective: Vitals:   11/05/20 0722 11/05/20 0723 11/05/20 0724 11/05/20 0725  BP:      Pulse: (!) 55 (!) 52 (!) 53 (!) 51  Resp: 16 13 19 10   Temp:      TempSrc:      SpO2: 100% 100% 100% 100%  Weight:  Height:        Intake/Output Summary (Last 24 hours) at 11/05/2020 0948 Last data filed at 11/05/2020 0102 Gross per 24 hour  Intake 545.5 ml  Output --  Net 545.5 ml   Filed Weights   11/03/20 0426 11/04/20 0644 11/05/20 0534  Weight: 69.1 kg 68.4 kg 67.7 kg    Examination:  General exam: Comfortable not in distress Respiratory system: Clear to auscultation bilaterally Cardiovascular system: S1-S2 heard, regular rate rhythm, no pedal edema Gastrointestinal system: Abdomen is soft nontender, bowel sounds normal Central nervous system: Alert and oriented Extremities: No pedal edema.  Skin: No  rashes seen.  psychiatry: Mood is appropriate    Data Reviewed: I have personally reviewed following labs and imaging studies  CBC: Recent Labs  Lab 11/01/20 0242 11/02/20 0205 11/03/20 0124 11/04/20 0145 11/05/20 0516  WBC 4.2 3.8* 3.5* 3.7* 3.9*  HGB 11.5* 11.8* 11.5* 11.9* 11.9*  HCT 33.3* 34.6* 34.1* 35.4* 35.2*  MCV 95.4 95.6 96.3 95.7 96.4  PLT 228 224 225 209 184    Basic Metabolic Panel: Recent Labs  Lab 11/01/20 0242 11/02/20 0205 11/03/20 0124 11/04/20 0145 11/05/20 0516  NA 136 137 136 137 137  K 3.9 4.0 4.2 4.2 4.0  CL 106 104 105 106 103  CO2 23 24 23 24 25   GLUCOSE 85 92 98 96 89  BUN 24* 20 25* 26* 26*  CREATININE 1.36* 1.32* 1.24 1.20 1.22  CALCIUM 9.1 9.4 9.1 9.4 9.7    GFR: Estimated Creatinine Clearance: 54.7 mL/min (by C-G formula based on SCr of 1.22 mg/dL).  Liver Function Tests: Recent Labs  Lab 11/05/20 0516  AST 79*  ALT 103*  ALKPHOS 49  BILITOT 0.3  PROT 6.4*  ALBUMIN 3.6    CBG: No results for input(s): GLUCAP in the last 168 hours.   Recent Results (from the past 240 hour(s))  Resp Panel by RT-PCR (Flu A&B, Covid) Nasopharyngeal Swab     Status: None   Collection Time: 10/28/20  2:50 PM   Specimen: Nasopharyngeal Swab; Nasopharyngeal(NP) swabs in vial transport medium  Result Value Ref Range Status   SARS Coronavirus 2 by RT PCR NEGATIVE NEGATIVE Final    Comment: (NOTE) SARS-CoV-2 target nucleic acids are NOT DETECTED.  The SARS-CoV-2 RNA is generally detectable in upper respiratory specimens during the acute phase of infection. The lowest concentration of SARS-CoV-2 viral copies this assay can detect is 138 copies/mL. A negative result does not preclude SARS-Cov-2 infection and should not be used as the sole basis for treatment or other patient management decisions. A negative result may occur with  improper specimen collection/handling, submission of specimen other than nasopharyngeal swab, presence of viral  mutation(s) within the areas targeted by this assay, and inadequate number of viral copies(<138 copies/mL). A negative result must be combined with clinical observations, patient history, and epidemiological information. The expected result is Negative.  Fact Sheet for Patients:  12/28/20  Fact Sheet for Healthcare Providers:  BloggerCourse.com  This test is no t yet approved or cleared by the [place] FDA and  has been authorized for detection and/or diagnosis of SARS-CoV-2 by FDA under an Emergency Use Authorization (EUA). This EUA will remain  in effect (meaning this test can be used) for the duration of the COVID-19 declaration under Section 564(b)(1) of the Act, 21 U.S.C.section 360bbb-3(b)(1), unless the authorization is terminated  or revoked sooner.       Influenza A by PCR NEGATIVE NEGATIVE Final  Influenza B by PCR NEGATIVE NEGATIVE Final    Comment: (NOTE) The Xpert Xpress SARS-CoV-2/FLU/RSV plus assay is intended as an aid in the diagnosis of influenza from Nasopharyngeal swab specimens and should not be used as a sole basis for treatment. Nasal washings and aspirates are unacceptable for Xpert Xpress SARS-CoV-2/FLU/RSV testing.  Fact Sheet for Patients: BloggerCourse.comhttps://www.fda.gov/media/152166/download  Fact Sheet for Healthcare Providers: SeriousBroker.ithttps://www.fda.gov/media/152162/download  This test is not yet approved or cleared by the [place] FDA and has been authorized for detection and/or diagnosis of SARS-CoV-2 by FDA under an Emergency Use Authorization (EUA). This EUA will remain in effect (meaning this test can be used) for the duration of the COVID-19 declaration under Section 564(b)(1) of the Act, 21 U.S.C. section 360bbb-3(b)(1), unless the authorization is terminated or revoked.  Performed at [facility], [address] [place], [place] [address]   Surgical pcr screen     Status: None    Collection Time: 10/29/20  5:19 PM   Specimen: Nasal Mucosa; Nasal Swab  Result Value Ref Range Status   MRSA, PCR NEGATIVE NEGATIVE Final   Staphylococcus aureus NEGATIVE NEGATIVE Final    Comment: (NOTE) The Xpert SA Assay (FDA approved for NASAL specimens in patients 70 years of age and older), is one component of a comprehensive surveillance program. It is not intended to diagnose infection nor to guide or monitor treatment. Performed at [facility], [address] [place], [place] [address]          Radiology Studies: DG Chest 2 View  Result Date: 11/04/2020 CLINICAL DATA:  Preop cardiovascular examination. EXAM: CHEST - 2 VIEW COMPARISON:  10/28/2020 FINDINGS: The mediastinal contours are within normal limits. No cardiomegaly. The lungs are clear bilaterally without evidence of focal consolidation, pleural effusion, or pneumothorax. No acute osseous abnormality. Surgical clips in the right upper quadrant. IMPRESSION: No acute cardiopulmonary process. Electronically Signed   By: Marliss Cootsylan  Suttle MD   On: 11/04/2020 16:47        Scheduled Meds: . [MAR Hold] (feeding supplement) PROSource Plus  30 mL Oral BID BM  . [MAR Hold] aspirin EC  81 mg Oral Daily  . [MAR Hold] atorvastatin  80 mg Oral Daily  . [MAR Hold] dapagliflozin propanediol  10 mg Oral Daily  . [MAR Hold] digoxin  0.125 mg Oral Daily  . epinephrine  0-10 mcg/min Intravenous To OR  . [MAR Hold] feeding supplement  237 mL Oral Q24H  . heparin-papaverine-plasmalyte irrigation   Irrigation To OR  . insulin   Intravenous To OR  . [MAR Hold] ivabradine  5 mg Oral BID WC  . [place] Blood Cardioplegia vial (lidocaine/magnesium/mannitol 0.26g-4g-6.4g)   Intracoronary Once  . [MAR Hold] losartan  12.5 mg Oral Daily  . [MAR Hold] multivitamin with minerals  1 tablet Oral Daily  . potassium chloride  80 mEq Other To OR  . [MAR Hold] sodium chloride flush  3 mL Intravenous Q12H  . [MAR Hold] sodium chloride  flush  3 mL Intravenous Q12H  . [MAR Hold] spironolactone  12.5 mg Oral Daily  . tranexamic acid  2 mg/kg Intracatheter To OR  . vasopressin  0-0.03 Units/min Intravenous To OR   Continuous Infusions: . [MAR Hold] sodium chloride    . [MAR Hold] sodium chloride    . DOBUTamine    . heparin 30,000 units/NS 1000 mL solution for CELLSAVER    . heparin Stopped (11/05/20 0557)  . milrinone    . nitroGLYCERIN    .  norepinephrine    . vancomycin       LOS: 8 days        Kathlen Mody, MD [facility]   To contact the attending provider between 7A-7P or the covering provider during after hours 7P-7A, please log into the web site [email or web address] and access using universal [place] password for that web site. If you do not have the password, please call the hospital operator.  11/05/2020, 9:48 AM

## 2020-11-05 NOTE — Telephone Encounter (Signed)
Patient Advocate Encounter   Received notification from [employer] that prior authorization for Troy Johnson is required.   PA submitted on CoverMyMeds Key  [id number] Status is pending   Will continue to follow.

## 2020-11-05 NOTE — Anesthesia Procedure Notes (Signed)
Central Venous Catheter Insertion Performed by: Oleta Mouse, MD, anesthesiologist Start/End5/03/2021 7:19 AM, 11/05/2020 7:29 AM Patient location: Pre-op. Preanesthetic checklist: patient identified, IV checked, site marked, risks and benefits discussed, surgical consent, monitors and equipment checked, pre-op evaluation, timeout performed and anesthesia consent Lidocaine 1% used for infiltration and patient sedated Hand hygiene performed  and maximum sterile barriers used  Catheter size: 9 Fr MAC introducer Procedure performed using ultrasound guided technique. Ultrasound Notes:anatomy identified, needle tip was noted to be adjacent to the nerve/plexus identified, no ultrasound evidence of intravascular and/or intraneural injection and image(s) printed for medical record Attempts: 1 Following insertion, line sutured, dressing applied and Biopatch. Post procedure assessment: blood return through all ports, free fluid flow and no air  Patient tolerated the procedure well with no immediate complications.

## 2020-11-05 NOTE — Anesthesia Preprocedure Evaluation (Signed)
Anesthesia Evaluation  Patient identified by MRN, date of birth, ID band Patient awake    Reviewed: Allergy & Precautions, NPO status , Patient's Chart, lab work & pertinent test results  History of Anesthesia Complications Negative for: history of anesthetic complications  Airway Mallampati: II  TM Distance: >3 FB Neck ROM: Full    Dental  (+) Dental Advisory Given, Missing, Teeth Intact,    Pulmonary neg shortness of breath, asthma , neg sleep apnea, neg recent URI, former smoker, neg PE Covid-19 Nucleic Acid Test Results Lab Results      Component                Value               Date                      SARSCOV2NAA              NEGATIVE            10/28/2020              breath sounds clear to auscultation       Cardiovascular hypertension, + Past MI and +CHF   Rhythm:Regular  1. Left ventricular ejection fraction, by estimation, is <20%. The left  ventricle has severely decreased function. The left ventricle demonstrates  global hypokinesis. The left ventricular internal cavity size was mildly  dilated. Left ventricular  diastolic parameters are consistent with Grade II diastolic dysfunction  (pseudonormalization). Small LV apical thrombus seen on Definity contrast  study.  2. Right ventricular systolic function is mildly reduced. The right  ventricular size is normal. There is normal pulmonary artery systolic  pressure.  3. Left atrial size was mildly dilated.  4. Right atrial size was mildly dilated.  5. Mild thciekning of mitral valve leaflets. Moderate mitral valve  regurgitation.   LM: Long vessel. Normal LAD: Prox 90% stenosis, followed by flush occlusion after Diag 1        No antegrade flow seen in LAD        Faint collaterals from RPL fill small caliber LAD        Diag 1 with prox tandem 95% stenoses LCx: Prox tandem 95% stenoses        Prox OM1 60% stenosis        Mid LCx 60% stenosis RCA: Prox  90% stenosis, followed by 100% occlusion         Bridging collaterals faintly fill mid RCA, and reconstitute distal RCA         Prox RPDA 70% stenosis, followed by 50 stenosis  PA: 39/14 mmHg, mean PAP 23 mmHg PCW: 6 mmHg  Compensated ischemic cardiomyopathy    Neuro/Psych negative neurological ROS  negative psych ROS   GI/Hepatic negative GI ROS, Neg liver ROS,   Endo/Other  negative endocrine ROS  Renal/GU Renal diseaseLab Results      Component                Value               Date                      CREATININE               1.22                11/05/2020  Musculoskeletal negative musculoskeletal ROS (+)   Abdominal   Peds  Hematology  (+) Blood dyscrasia, anemia , Lab Results      Component                Value               Date                      WBC                      3.9 (L)             11/05/2020                HGB                      11.9 (L)            11/05/2020                HCT                      35.2 (L)            11/05/2020                MCV                      96.4                11/05/2020                PLT                      184                 11/05/2020              Anesthesia Other Findings   Reproductive/Obstetrics                             Anesthesia Physical Anesthesia Plan  ASA: IV  Anesthesia Plan: General   Post-op Pain Management:    Induction: Intravenous  PONV Risk Score and Plan: 2 and Treatment may vary due to age or medical condition  Airway Management Planned: Oral ETT  Additional Equipment: Arterial line, CVP, PA Cath, TEE and Ultrasound Guidance Line Placement  Intra-op Plan:   Post-operative Plan: Post-operative intubation/ventilation  Informed Consent: I have reviewed the patients History and Physical, chart, labs and discussed the procedure including the risks, benefits and alternatives for the proposed anesthesia with the patient or authorized  representative who has indicated his/her understanding and acceptance.     Dental advisory given  Plan Discussed with: CRNA and Surgeon  Anesthesia Plan Comments:         Anesthesia Quick Evaluation

## 2020-11-05 NOTE — Anesthesia Procedure Notes (Signed)
Arterial Line Insertion Start/End5/03/2021 7:05 AM, 11/05/2020 7:10 AM Performed by: Laruth Bouchard., CRNA, CRNA  Patient location: Pre-op. Preanesthetic checklist: patient identified, IV checked, site marked, risks and benefits discussed, surgical consent, monitors and equipment checked, pre-op evaluation, timeout performed and anesthesia consent Lidocaine 1% used for infiltration and patient sedated Left, radial was placed Catheter size: 20 Fr Hand hygiene performed  and maximum sterile barriers used  Allen's test indicative of satisfactory collateral circulation Attempts: 1 Procedure performed without using ultrasound guided technique. Following insertion, dressing applied. Post procedure assessment: normal and unchanged  Patient tolerated the procedure well with no immediate complications. Additional procedure comments: Inserted by Stann Ore ,SRNA.

## 2020-11-05 NOTE — Progress Notes (Signed)
Patient ID: Troy Johnson, male   DOB: 1951-01-09, 70 y.o.   MRN: [id number]  TCTS Evening Rounds:   Hemodynamically stable  CI = 2.56 on low dose NE  Extubated.  Urine output good  CT output low  CBC    Component Value Date/Time   WBC 6.0 11/05/2020 1420   RBC 2.77 (L) 11/05/2020 1420   HGB 8.8 (L) 11/05/2020 1743   HGB 12.9 (L) 11/24/2019 1102   HCT 26.0 (L) 11/05/2020 1743   HCT 38.7 11/24/2019 1102   PLT 111 (L) 11/05/2020 1420   PLT 321 11/24/2019 1102   MCV 99.3 11/05/2020 1420   MCV 96 11/24/2019 1102   MCH 32.9 11/05/2020 1420   MCHC 33.1 11/05/2020 1420   RDW 14.7 11/05/2020 1420   RDW 14.4 11/24/2019 1102   LYMPHSABS 1.7 10/28/2020 1433   LYMPHSABS 1.6 11/24/2019 1102   MONOABS 0.5 10/28/2020 1433   EOSABS 0.1 10/28/2020 1433   EOSABS 0.2 11/24/2019 1102   BASOSABS 0.0 10/28/2020 1433   BASOSABS 0.0 11/24/2019 1102     BMET    Component Value Date/Time   NA 138 11/05/2020 1743   NA 143 11/24/2019 1102   K 4.8 11/05/2020 1743   CL 103 11/05/2020 1149   CO2 25 11/05/2020 0516   GLUCOSE 132 (H) 11/05/2020 1149   BUN 22 11/05/2020 1149   BUN 16 11/24/2019 1102   CREATININE 1.00 11/05/2020 1149   CALCIUM 9.7 11/05/2020 0516   GFRNONAA >60 11/05/2020 0516   GFRAA 78 11/24/2019 1102     A/P:  Stable postop course. Continue current plans

## 2020-11-05 NOTE — Plan of Care (Signed)
Intiated pre-op CABG    Problem: Education: Goal: Knowledge of General Education information will improve Description: Including pain rating scale, medication(s)/side effects and non-pharmacologic comfort measures Outcome: Not Progressing   Problem: Health Behavior/Discharge Planning: Goal: Ability to manage health-related needs will improve Outcome: Not Progressing   Problem: Clinical Measurements: Goal: Ability to maintain clinical measurements within normal limits will improve Outcome: Not Progressing Goal: Will remain free from infection Outcome: Not Progressing Goal: Diagnostic test results will improve Outcome: Not Progressing Goal: Respiratory complications will improve Outcome: Not Progressing Goal: Cardiovascular complication will be avoided Outcome: Not Progressing   Problem: Activity: Goal: Risk for activity intolerance will decrease Outcome: Not Progressing   Problem: Nutrition: Goal: Adequate nutrition will be maintained Outcome: Not Progressing   Problem: Coping: Goal: Level of anxiety will decrease Outcome: Not Progressing   Problem: Elimination: Goal: Will not experience complications related to bowel motility Outcome: Not Progressing Goal: Will not experience complications related to urinary retention Outcome: Not Progressing   Problem: Pain Managment: Goal: General experience of comfort will improve Outcome: Not Progressing   Problem: Safety: Goal: Ability to remain free from injury will improve Outcome: Not Progressing   Problem: Skin Integrity: Goal: Risk for impaired skin integrity will decrease Outcome: Not Progressing   Problem: Education: Goal: Ability to demonstrate management of disease process will improve Outcome: Not Progressing Goal: Ability to verbalize understanding of medication therapies will improve Outcome: Not Progressing Goal: Individualized Educational Video(s) Outcome: Not Progressing   Problem: Activity: Goal:  Capacity to carry out activities will improve Outcome: Not Progressing   Problem: Cardiac: Goal: Ability to achieve and maintain adequate cardiopulmonary perfusion will improve Outcome: Not Progressing   Problem: Education: Goal: Will demonstrate proper wound care and an understanding of methods to prevent future damage Outcome: Not Progressing Goal: Knowledge of disease or condition will improve Outcome: Not Progressing Goal: Knowledge of the prescribed therapeutic regimen will improve Outcome: Not Progressing Goal: Individualized Educational Video(s) Outcome: Not Progressing   Problem: Activity: Goal: Risk for activity intolerance will decrease Outcome: Not Progressing   Problem: Cardiac: Goal: Will achieve and/or maintain hemodynamic stability Outcome: Not Progressing   Problem: Clinical Measurements: Goal: Postoperative complications will be avoided or minimized Outcome: Not Progressing   Problem: Respiratory: Goal: Respiratory status will improve Outcome: Not Progressing   Problem: Skin Integrity: Goal: Wound healing without signs and symptoms of infection Outcome: Not Progressing Goal: Risk for impaired skin integrity will decrease Outcome: Not Progressing   Problem: Urinary Elimination: Goal: Ability to achieve and maintain adequate renal perfusion and functioning will improve Outcome: Not Progressing

## 2020-11-05 NOTE — Progress Notes (Signed)
     [address]       [place] [address]             [phone]       No events  Vitals:   11/04/20 1948 11/05/20 0534  BP: 116/76 107/66  Pulse: 65 72  Resp: 17 15  Temp: 98.3 F (36.8 C) 97.6 F (36.4 C)  SpO2: 100% 100%   Alert  Sinus EWOB  OR today for CABG 4.  Troy Johnson

## 2020-11-05 NOTE — Op Note (Signed)
[address]       [name] [address]             [phone]                                          11/05/2020 Patient:  [name] Pre-Op Dx:  3V CAD   Congestive heart failure with EF < 15%   NSTEMI      Post-op Dx:  same Procedure: CABG X 4.  LIMA LAD, RSVG D2, OM1, and PDA   Endoscopic greater saphenous vein harvest on the left   Surgeon and Role:      * Cimberly Stoffel, Eliezer Lofts, MD - Primary    * E. Barrett, PA-C - assisting  Anesthesia  general EBL:  Blood Administration: none Xclamp Time:  68 min Pump Time:   Drains: 28 F blake drain: R, L, mediastinal  Wires: none Counts: correct   Indications: 70 yo male with 3V CAD and severe LV dysfunction with an EF < 20%.  He presents mostly with heart failure symptoms, and his septum appears thinned out on echo.  Cardiac MRI showed viability along the anterior wall.  He was deem high risk, but an appropriate candidate for revascularization  Findings: Good conduit.  Good targets.  Improved motion on the anterior wall following bypass.  Dobutamine 2.5 and levophed 1  Operative Technique: All invasive lines were placed in pre-op holding.  After the risks, benefits and alternatives were thoroughly discussed, the patient was brought to the operative theatre.  Anesthesia was induced, and the patient was prepped and draped in normal sterile fashion.  An appropriate surgical pause was performed, and pre-operative antibiotics were dosed accordingly.  We began with simultaneous incisions along the left leg for harvesting of the greater saphenous vein and the chest for the sternotomy.  In regards to the sternotomy, this was carried down with bovie cautery, and the sternum was divided with a reciprocating saw.  Meticulous hemostasis was obtained.  The left internal thoracic artery was exposed and harvested in in pedicled fashion.  The patient was systemically heparinized, and the artery was divided  distally, and placed in a papaverine sponge.    The sternal elevator was removed, and a retractor was placed.  The pericardium was divided in the midline and fashioned into a cradle with pericardial stitches.   After we confirmed an appropriate ACT, the ascending aorta was cannulated in standard fashion.  The right atrial appendage was used for venous cannulation site.  Cardiopulmonary bypass was initiated, and the heart retractor was placed. The cross clamp was applied, and a dose of anterograde cardioplegia was given with good arrest of the heart.  We moved to the posterior wall of the heart, and found a good target on the PDA.  An arteriotomy was made, and the vein graft was anastomosed to it in an end to side fashion.  Next we exposed the lateral wall, and found a good target on the OM1.  An end to side anastomosis with the vein graft was then created.  Next, we exposed the anterior wall of the heart and identified a good target on D1.   An arteriotomy was created.  The vein was anastomosed in an end to side fashion.  Finally, we exposed a good target on the LAD, and fashioned an end to  side anastomosis between it and the LITA.  We began to re-warm, and a re-animation dose of cardioplegia was given.  The heart was de-aired, and the cross clamp was removed.  Meticulous hemostasis was obtained.    A partial occludding clamp was then placed on the ascending aorta, and we created an end to side anastomosis between it and the proximal vein grafts.  The proximal sites were marked with rings.  Hemostasis was obtained, and we separated from cardiopulmonary bypass without event.the heparin was reversed with protamine.  Chest tubes and wires were placed, and the sternum was re-approximated with with sternal wires.  The soft tissue and skin were re-approximated wth absorbable suture.    The patient tolerated the procedure without any immediate complications, and was transferred to the ICU in guarded  condition.  Lemont Sitzmann Keane Scrape

## 2020-11-05 NOTE — Progress Notes (Signed)
Pt transported to pre-op via monitor and RN. Pt vss. No acute distress noted. No complaints of CP overnight. Pt NPO since MN. viod on call. Hep stopped on call. Report given at bedside to CRNA. poc passed on.

## 2020-11-06 ENCOUNTER — Inpatient Hospital Stay (HOSPITAL_COMMUNITY): Payer: Medicare Other

## 2020-11-06 ENCOUNTER — Encounter (HOSPITAL_COMMUNITY): Payer: Self-pay | Admitting: Thoracic Surgery (Cardiothoracic Vascular Surgery)

## 2020-11-06 DIAGNOSIS — I5043 Acute on chronic combined systolic (congestive) and diastolic (congestive) heart failure: Secondary | ICD-10-CM | POA: Diagnosis not present

## 2020-11-06 LAB — COOXEMETRY PANEL
Carboxyhemoglobin: 0.7 % (ref 0.5–1.5)
Methemoglobin: 1.7 % — ABNORMAL HIGH (ref 0.0–1.5)
O2 Saturation: 65.8 %
Total hemoglobin: 8.1 g/dL — ABNORMAL LOW (ref 12.0–16.0)

## 2020-11-06 LAB — GLUCOSE, CAPILLARY
Glucose-Capillary: 111 mg/dL — ABNORMAL HIGH (ref 70–99)
Glucose-Capillary: 105 mg/dL — ABNORMAL HIGH (ref 70–99)
Glucose-Capillary: 127 mg/dL — ABNORMAL HIGH (ref 70–99)
Glucose-Capillary: 128 mg/dL — ABNORMAL HIGH (ref 70–99)
Glucose-Capillary: 102 mg/dL — ABNORMAL HIGH (ref 70–99)

## 2020-11-06 LAB — BASIC METABOLIC PANEL
Anion gap: 4 — ABNORMAL LOW (ref 5–15)
Anion gap: 5 (ref 5–15)
BUN: 15 mg/dL (ref 8–23)
BUN: 13 mg/dL (ref 8–23)
CO2: 22 mmol/L (ref 22–32)
CO2: 25 mmol/L (ref 22–32)
Calcium: 8.5 mg/dL — ABNORMAL LOW (ref 8.9–10.3)
Calcium: 8.8 mg/dL — ABNORMAL LOW (ref 8.9–10.3)
Chloride: 107 mmol/L (ref 98–111)
Chloride: 101 mmol/L (ref 98–111)
Creatinine, Ser: 1.08 mg/dL (ref 0.61–1.24)
Creatinine, Ser: 1.01 mg/dL (ref 0.61–1.24)
GFR, Estimated: >60 mL/min (ref >60)
GFR, Estimated: >60 mL/min (ref >60)
Glucose, Bld: 122 mg/dL — ABNORMAL HIGH (ref 70–99)
Glucose, Bld: 126 mg/dL — ABNORMAL HIGH (ref 70–99)
Potassium: 4.3 mmol/L (ref 3.5–5.1)
Potassium: 4.3 mmol/L (ref 3.5–5.1)
Sodium: 133 mmol/L — ABNORMAL LOW (ref 135–145)
Sodium: 131 mmol/L — ABNORMAL LOW (ref 135–145)

## 2020-11-06 LAB — POCT I-STAT 7, (LYTES, BLD GAS, ICA,H+H)
Acid-base deficit: 2 mmol/L (ref 0.0–2.0)
Bicarbonate: 24.2 mmol/L (ref 20.0–28.0)
Calcium, Ion: 1.34 mmol/L (ref 1.15–1.40)
HCT: 26 % — ABNORMAL LOW (ref 39.0–52.0)
Hemoglobin: 8.8 g/dL — ABNORMAL LOW (ref 13.0–17.0)
O2 Saturation: 95 %
Potassium: 4.8 mmol/L (ref 3.5–5.1)
Sodium: 137 mmol/L (ref 135–145)
TCO2: 26 mmol/L (ref 22–32)
pCO2 arterial: 44.8 mmHg (ref 32.0–48.0)
pH, Arterial: 7.341 — ABNORMAL LOW (ref 7.350–7.450)
pO2, Arterial: 79 mmHg — ABNORMAL LOW (ref 83.0–108.0)

## 2020-11-06 LAB — MAGNESIUM
Magnesium: 2.2 mg/dL (ref 1.7–2.4)
Magnesium: 1.9 mg/dL (ref 1.7–2.4)

## 2020-11-06 LAB — CBC
HCT: 25.9 % — ABNORMAL LOW (ref 39.0–52.0)
HCT: 25.3 % — ABNORMAL LOW (ref 39.0–52.0)
Hemoglobin: 8.8 g/dL — ABNORMAL LOW (ref 13.0–17.0)
Hemoglobin: 8.4 g/dL — ABNORMAL LOW (ref 13.0–17.0)
MCH: 33.2 pg (ref 26.0–34.0)
MCH: 32.3 pg (ref 26.0–34.0)
MCHC: 34 g/dL (ref 30.0–36.0)
MCHC: 33.2 g/dL (ref 30.0–36.0)
MCV: 97.7 fL (ref 80.0–100.0)
MCV: 97.3 fL (ref 80.0–100.0)
Platelets: 124 10*3/uL — ABNORMAL LOW (ref 150–400)
Platelets: 112 10*3/uL — ABNORMAL LOW (ref 150–400)
RBC: 2.65 MIL/uL — ABNORMAL LOW (ref 4.22–5.81)
RBC: 2.6 MIL/uL — ABNORMAL LOW (ref 4.22–5.81)
RDW: 14.7 % (ref 11.5–15.5)
RDW: 14.7 % (ref 11.5–15.5)
WBC: 7.3 10*3/uL (ref 4.0–10.5)
WBC: 7 10*3/uL (ref 4.0–10.5)
nRBC: 0 % (ref 0.0–0.2)
nRBC: 0 % (ref 0.0–0.2)

## 2020-11-06 MED ORDER — DIGOXIN 125 MCG PO TABS
0.1250 mg | ORAL_TABLET | Freq: Every day | ORAL | Status: DC
  Administered 2020-11-06 – 2020-11-14 (×9): 0.125 mg via ORAL
  Filled 2020-11-06 (×9): qty 1

## 2020-11-06 MED ORDER — INSULIN ASPART 100 UNIT/ML IJ SOLN: 0.0000 [IU] | INTRAMUSCULAR | Status: DC

## 2020-11-06 MED ORDER — FUROSEMIDE 10 MG/ML IJ SOLN
20.0000 mg | Freq: Once | INTRAMUSCULAR | Status: AC
  Administered 2020-11-06: 20 mg via INTRAVENOUS
  Filled 2020-11-06: qty 2

## 2020-11-06 MED ORDER — MIDODRINE HCL 5 MG PO TABS
5.0000 mg | ORAL_TABLET | Freq: Three times a day (TID) | ORAL | Status: DC
  Administered 2020-11-06: 5 mg via ORAL
  Filled 2020-11-06: qty 1

## 2020-11-06 MED FILL — Calcium Chloride Inj 10%: INTRAVENOUS | Qty: 10 | Status: AC

## 2020-11-06 MED FILL — Potassium Chloride Inj 2 mEq/ML: INTRAVENOUS | Qty: 40 | Status: AC

## 2020-11-06 MED FILL — Sodium Bicarbonate IV Soln 8.4%: INTRAVENOUS | Qty: 50 | Status: AC

## 2020-11-06 MED FILL — Sodium Chloride IV Soln 0.9%: INTRAVENOUS | Qty: 2000 | Status: AC

## 2020-11-06 MED FILL — Mannitol IV Soln 20%: INTRAVENOUS | Qty: 500 | Status: AC

## 2020-11-06 MED FILL — Lidocaine HCl Local Preservative Free (PF) Inj 2%: INTRAMUSCULAR | Qty: 15 | Status: AC

## 2020-11-06 MED FILL — Heparin Sodium (Porcine) Inj 1000 Unit/ML: INTRAMUSCULAR | Qty: 30 | Status: AC

## 2020-11-06 MED FILL — Electrolyte-R (PH 7.4) Solution: INTRAVENOUS | Qty: 4000 | Status: AC

## 2020-11-06 MED FILL — Heparin Sodium (Porcine) Inj 1000 Unit/ML: INTRAMUSCULAR | Qty: 10 | Status: AC

## 2020-11-06 NOTE — Progress Notes (Signed)
      [address]       [place] [address]             [phone]      POD # 1 CABG  BP 128/75   Pulse 97   Temp 100.04 F (37.8 C)   Resp (!) 24   Ht 6\' 1"  (1.854 m)   Wt 72.8 kg   SpO2 99%   BMI 21.17 kg/m  2l [place] 98% sat norepi at 6 mcg/min  Intake/Output Summary (Last 24 hours) at 11/06/2020 1925 Last data filed at 11/06/2020 1600 Gross per 24 hour  Intake 1697.84 ml  Output 2930 ml  Net -1232.16 ml   K= 4.3, Hct 25  Stable POD # 1  Kailana Benninger C. 01/06/2021, MD [facility] [phone]

## 2020-11-06 NOTE — Progress Notes (Signed)
Patient ID: Troy Johnson, male   DOB: 06-29-1951, 70 y.o.   MRN: [id number]     Advanced Heart Failure Rounding Note  PCP-Cardiologist: None   Subjective:    CABG (5/10): LIMA-LAD, SVG-D2, SVG-OM1, SVG-PDA.   Chest is sore this morning.   Creatinine stable 1.08, remains on dobutamine 2.5 and NE 8. Weight up about 11 lbs.   Swan numbers:  PA 15/8 (not good waveform but CXR shows Swan in [place] - not wedged) RA 5 CI 2.6  Cardiac MRI:  1. Severe LV enlargement with severe global hypokinesis with regional variation. EF 14%. 2.  Small apical LV thrombus. 3.  Normal RV size with mildly decreased systolic function, EF 36%. 4.  Moderate mitral regurgitation. 5. Based on severe LV dysfunction, there was less late gadolinium enhancement than I would have expected. With the exception of the apical septal wall and the true apex, would expect the remainder of the LV myocardium to be viable.   Objective:   Weight Range: 72.8 kg Body mass index is 21.17 kg/m.   Vital Signs:   Temp:  [96.08 F (35.6 C)-99.9 F (37.7 C)] 98.6 F (37 C) (05/11 0645) Pulse Rate:  [51-99] 90 (05/11 0645) Resp:  [10-35] 17 (05/11 0645) BP: (90-129)/(47-83) 109/67 (05/11 0500) SpO2:  [94 %-100 %] 99 % (05/11 0645) Arterial Line BP: (100-152)/(37-62) 132/50 (05/11 0645) FiO2 (%):  [40 %-50 %] 40 % (05/10 1720) Weight:  [72.8 kg] 72.8 kg (05/11 0530) Last BM Date: 11/03/20  Weight change: Filed Weights   11/04/20 0644 11/05/20 0534 11/06/20 0530  Weight: 68.4 kg 67.7 kg 72.8 kg    Intake/Output:   Intake/Output Summary (Last 24 hours) at 11/06/2020 0720 Last data filed at 11/06/2020 0600 Gross per 24 hour  Intake 4167.09 ml  Output 4360 ml  Net -192.91 ml      Physical Exam   General: NAD Neck: No JVD, no thyromegaly or thyroid nodule.  Lungs: Clear to auscultation bilaterally with normal respiratory effort. CV: s/p sternotomy.  Heart regular S1/S2, no S3/S4, no murmur.  No peripheral  edema.   Abdomen: Soft, nontender, no hepatosplenomegaly, no distention.  Skin: Intact without lesions or rashes.  Neurologic: Alert and oriented x 3.  Psych: Normal affect. Extremities: No clubbing or cyanosis.  HEENT: Normal.    Telemetry   NSR with rare PVCs rate 70s (personally reviewed)  Labs    CBC Recent Labs    11/05/20 1953 11/06/20 0419  WBC 6.7 7.3  HGB 8.7* 8.8*  HCT 26.5* 25.9*  MCV 98.5 97.7  PLT 132* 124*   Basic Metabolic Panel Recent Labs    11/91/47 1953 11/06/20 0419  NA 133* 133*  K 4.9 4.3  CL 106 107  CO2 21* 22  GLUCOSE 113* 122*  BUN 19 15  CREATININE 1.08 1.08  CALCIUM 8.5* 8.5*  MG 3.1* 2.2   Liver Function Tests Recent Labs    11/05/20 0516  AST 79*  ALT 103*  ALKPHOS 49  BILITOT 0.3  PROT 6.4*  ALBUMIN 3.6   No results for input(s): LIPASE, AMYLASE in the last 72 hours. Cardiac Enzymes No results for input(s): CKTOTAL, CKMB, CKMBINDEX, TROPONINI in the last 72 hours.  BNP: BNP (last 3 results) Recent Labs    10/28/20 1433 11/02/20 0205  BNP 1,827.5* 1,261.6*    ProBNP (last 3 results) No results for input(s): PROBNP in the last 8760 hours.   D-Dimer No results for input(s): DDIMER in the last 72  hours. Hemoglobin A1C Recent Labs    11/04/20 0145  HGBA1C 5.9*   Fasting Lipid Panel No results for input(s): CHOL, HDL, LDLCALC, TRIG, CHOLHDL, LDLDIRECT in the last 72 hours. Thyroid Function Tests No results for input(s): TSH, T4TOTAL, T3FREE, THYROIDAB in the last 72 hours.  Invalid input(s): FREET3  Other results:   Imaging    DG Chest Port 1 View  Result Date: 11/05/2020 CLINICAL DATA:  Patient status post CABG today. EXAM: PORTABLE CHEST 1 VIEW COMPARISON:  PA and lateral chest 11/04/2020. FINDINGS: Endotracheal tube is in place with the tip in good position just below the clavicular heads. NG tube side port is seen in the stomach. Right IJ approach Swan-Ganz catheter tip is in the proximal right  main pulmonary artery. Left chest tube and mediastinal drain are noted. Lucency in the lower left chest is worrisome for a small pneumothorax. Lung volumes are low with crowding of the bronchovascular structures. No consolidative process or pleural effusion. IMPRESSION: Chest tubes and lines project in good position. Lucency in the lower left chest is worrisome for a small pneumothorax. Critical Value/emergent results were called by telephone at the time of interpretation on 11/05/2020 at 3:02 pm to provider Lowella Dandy , who verbally acknowledged these results. Electronically Signed   By: Drusilla Kanner M.D.   On: 11/05/2020 15:02     Medications:     Scheduled Medications: . (feeding supplement) PROSource Plus  30 mL Oral BID BM  . acetaminophen  1,000 mg Oral Q6H   Or  . acetaminophen (TYLENOL) oral liquid 160 mg/5 mL  1,000 mg Per Tube Q6H  . aspirin EC  325 mg Oral Daily   Or  . aspirin  324 mg Per Tube Daily  . atorvastatin  80 mg Oral Daily  . bisacodyl  10 mg Oral Daily   Or  . bisacodyl  10 mg Rectal Daily  . Chlorhexidine Gluconate Cloth  6 each Topical Daily  . digoxin  0.125 mg Oral Daily  . docusate sodium  200 mg Oral Daily  . furosemide  20 mg Intravenous Once  . insulin aspart  0-24 Units Subcutaneous Q4H  . metoCLOPramide (REGLAN) injection  10 mg Intravenous Q6H  . metoprolol tartrate  12.5 mg Oral BID   Or  . metoprolol tartrate  12.5 mg Per Tube BID  . multivitamin with minerals  1 tablet Oral Daily  . [START ON 11/07/2020] pantoprazole  40 mg Oral Daily  . sodium chloride flush  10-40 mL Intracatheter Q12H  . sodium chloride flush  3 mL Intravenous Q12H    Infusions: . sodium chloride 10 mL/hr at 11/06/20 0600  . sodium chloride    . sodium chloride 20 mL/hr at 11/05/20 1400  . albumin human 12.5 g (11/05/20 1458)  . dexmedetomidine (PRECEDEX) IV infusion Stopped (11/06/20 0258)  . DOBUTamine 2.5 mcg/kg/min (11/06/20 0600)  . epinephrine    . lactated  ringers    . lactated ringers    . lactated ringers 20 mL/hr at 11/06/20 0600  . levofloxacin (LEVAQUIN) IV    . milrinone    . nitroGLYCERIN Stopped (11/05/20 1427)  . norepinephrine (LEVOPHED) Adult infusion 8 mcg/min (11/06/20 0650)  . phenylephrine (NEO-SYNEPHRINE) Adult infusion Stopped (11/05/20 1400)  . vasopressin      PRN Medications: sodium chloride, albumin human, ipratropium-albuterol, lactated ringers, metoprolol tartrate, midazolam, morphine injection, ondansetron (ZOFRAN) IV, oxyCODONE, sodium chloride flush, sodium chloride flush, traMADol   Assessment/Plan   1. CAD: No chest  pain, presented with dyspnea.  NSTEMI with HS-TnI elevated to peak 746.  Cath showed severe multivessel CAD with reasonable targets for CABG.  However, EF < 20%.  Cardiac MRI suggested extensive viability (except for apical septal wall and true apex).  Now s/p CABG x 4 on 5/10.  - ASA + atorvastatin 80 mg daily.  2. Acute systolic CHF: Ischemic cardiomyopathy.  Echo this admission with EF <20%, the septum and apex were akinetic, RV function looked low normal, moderate MR.  Cardiac MRI showed EF 14% with scar in the apical septal wall and apex, mildly decreased RV systolic function. Suspect significant viability as above.  Now s/p CABG.  Weight up 11 lbs but CVP only 5, CI 2.6 by Ernestine Conrad and good by Flowtrack as well.  He is on NE 8 and dobutamine 2.5. MAP good.  - Resume digoxin 0.125 daily.  - Wean NE today, continue dobutamine 2.5.  - Add GDMT after weaning of pressor/inotrope.  - Would hope for improvement in function with revascularization.  3. LV thrombus: Small, noted on echo and cMRI.  - Would eventually start warfarin when ok with surgery.  4. Mitral regurgitation: Likely functional.  Moderate.  5. Hyperlipidemia: Very high LDL.   - Atorvastatin 80 daily.  - May need Repatha in future.   CRITICAL CARE Performed by: Marca Ancona  Total critical care time: 35 minutes  Critical care time  was exclusive of separately billable procedures and treating other patients.  Critical care was necessary to treat or prevent imminent or life-threatening deterioration.  Critical care was time spent personally by me on the following activities: development of treatment plan with patient and/or surrogate as well as nursing, discussions with consultants, evaluation of patient's response to treatment, examination of patient, obtaining history from patient or surrogate, ordering and performing treatments and interventions, ordering and review of laboratory studies, ordering and review of radiographic studies, pulse oximetry and re-evaluation of patient's condition.   Length of Stay: 9  Marca Ancona, MD  11/06/2020, 7:20 AM  Advanced Heart Failure Team Pager [phone] (M-F; 7a - 5p)  Please contact [facility] for night-coverage after hours (5p -7a ) and weekends on [email or web address]

## 2020-11-06 NOTE — Progress Notes (Signed)
      [address]       [place] [address]             [phone]                 1 Day Post-Op Procedure(s) (LRB): CORONARY ARTERY BYPASS GRAFTING (CABG) (N/A) TRANSESOPHAGEAL ECHOCARDIOGRAM (TEE) (N/A)   Events: No events extubated _______________________________________________________________ Vitals: BP 116/72 (BP Location: Left Arm)   Pulse 83   Temp 98.6 F (37 C) (Core)   Resp 18   Ht 6\' 1"  (1.854 m)   Wt 72.8 kg   SpO2 100%   BMI 21.17 kg/m   - Neuro: alert NAD  - Cardiovascular: sinus  Drips: dob 2.5, levo 8.   PAP: (10-26)/(4-20) 15/11 CVP:  [0 mmHg-7 mmHg] 2 mmHg CO:  [3.6 L/min-5.7 L/min] 5 L/min CI:  [1.9 L/min/m2-3 L/min/m2] 2.6 L/min/m2  - Pulm: EWOB  ABG    Component Value Date/Time   PHART 7.325 (L) 11/05/2020 1855   PCO2ART 33.2 11/05/2020 1855   PO2ART 80 (L) 11/05/2020 1855   HCO3 17.2 (L) 11/05/2020 1855   TCO2 18 (L) 11/05/2020 1855   ACIDBASEDEF 8.0 (H) 11/05/2020 1855   O2SAT 65.8 11/06/2020 0815    - Abd: soft - Extremity: warm  .Intake/Output      05/10 0701 05/11 0700 05/11 0701 05/12 0700   I.V. (mL/kg) 2826 (38.8) 204.8 (2.8)   Blood 350    IV Piggyback 991.1 150   Total Intake(mL/kg) 4167.1 (57.2) 354.8 (4.9)   Urine (mL/kg/hr) 3050 (1.7)    Blood 650    Chest Tube 660    Total Output 4360    Net -192.9 +354.8           _______________________________________________________________ Labs: CBC Latest Ref Rng & Units 11/06/2020 11/05/2020 11/05/2020  WBC 4.0 - 10.5 K/uL 7.3 6.7 -  Hemoglobin 13.0 - 17.0 g/dL 01/05/2021) 8.8(E) 7.1(L)  Hematocrit 39.0 - 52.0 % 25.9(L) 26.5(L) 21.0(L)  Platelets 150 - 400 K/uL 124(L) 132(L) -   CMP Latest Ref Rng & Units 11/06/2020 11/05/2020 11/05/2020  Glucose 70 - 99 mg/dL 01/05/2021) 034(J) -  BUN 8 - 23 mg/dL 15 19 -  Creatinine 179(X - 1.24 mg/dL 5.05 6.97 -  Sodium 9.48 - 145 mmol/L 133(L) 133(L) 138  Potassium 3.5 - 5.1 mmol/L 4.3 4.9 4.0  Chloride 98 - 111 mmol/L  107 106 -  CO2 22 - 32 mmol/L 22 21(L) -  Calcium 8.9 - 10.3 mg/dL 016) 5.5(V) -  Total Protein 6.5 - 8.1 g/dL - - -  Total Bilirubin 0.3 - 1.2 mg/dL - - -  Alkaline Phos 38 - 126 U/L - - -  AST 15 - 41 U/L - - -  ALT 0 - 44 U/L - - -    CXR: PV congestion  _______________________________________________________________  Assessment and Plan: POD 1 s/p CABG  Neuro: pain controlled CV: on dob, and levo.  Wean as tolerated.  Continue asp/statin Pulm: continue pulm toilet Renal: good uop GI: continue clears while on levo Heme: stable ID: [email or web address] Endo: SSI Dispo: continue ICU care   Zania Kalisz O Raul Torrance 11/06/2020 1:15 PM

## 2020-11-07 ENCOUNTER — Inpatient Hospital Stay (HOSPITAL_COMMUNITY): Payer: Medicare Other

## 2020-11-07 DIAGNOSIS — I5021 Acute systolic (congestive) heart failure: Secondary | ICD-10-CM | POA: Diagnosis not present

## 2020-11-07 LAB — CBC
HCT: 24.2 % — ABNORMAL LOW (ref 39.0–52.0)
Hemoglobin: 8.1 g/dL — ABNORMAL LOW (ref 13.0–17.0)
MCH: 32.9 pg (ref 26.0–34.0)
MCHC: 33.5 g/dL (ref 30.0–36.0)
MCV: 98.4 fL (ref 80.0–100.0)
Platelets: 95 10*3/uL — ABNORMAL LOW (ref 150–400)
RBC: 2.46 MIL/uL — ABNORMAL LOW (ref 4.22–5.81)
RDW: 15.1 % (ref 11.5–15.5)
WBC: 7.7 10*3/uL (ref 4.0–10.5)
nRBC: 0 % (ref 0.0–0.2)

## 2020-11-07 LAB — GLUCOSE, CAPILLARY
Glucose-Capillary: 101 mg/dL — ABNORMAL HIGH (ref 70–99)
Glucose-Capillary: 101 mg/dL — ABNORMAL HIGH (ref 70–99)
Glucose-Capillary: 108 mg/dL — ABNORMAL HIGH (ref 70–99)
Glucose-Capillary: 110 mg/dL — ABNORMAL HIGH (ref 70–99)
Glucose-Capillary: 126 mg/dL — ABNORMAL HIGH (ref 70–99)
Glucose-Capillary: 129 mg/dL — ABNORMAL HIGH (ref 70–99)

## 2020-11-07 LAB — BASIC METABOLIC PANEL
Anion gap: 7 (ref 5–15)
BUN: 13 mg/dL (ref 8–23)
CO2: 26 mmol/L (ref 22–32)
Calcium: 9 mg/dL (ref 8.9–10.3)
Chloride: 101 mmol/L (ref 98–111)
Creatinine, Ser: 1.05 mg/dL (ref 0.61–1.24)
GFR, Estimated: >60 mL/min (ref >60)
Glucose, Bld: 113 mg/dL — ABNORMAL HIGH (ref 70–99)
Potassium: 3.8 mmol/L (ref 3.5–5.1)
Sodium: 134 mmol/L — ABNORMAL LOW (ref 135–145)

## 2020-11-07 LAB — COOXEMETRY PANEL
Carboxyhemoglobin: 0.9 % (ref 0.5–1.5)
Methemoglobin: 1.1 % (ref 0.0–1.5)
O2 Saturation: 64 %
Total hemoglobin: 11.8 g/dL — ABNORMAL LOW (ref 12.0–16.0)

## 2020-11-07 MED ORDER — ORAL CARE MOUTH RINSE
15.0000 mL | Freq: Two times a day (BID) | OROMUCOSAL | Status: DC
  Administered 2020-11-07 – 2020-11-14 (×13): 15 mL via OROMUCOSAL

## 2020-11-07 MED ORDER — MIDODRINE HCL 5 MG PO TABS
10.0000 mg | ORAL_TABLET | Freq: Three times a day (TID) | ORAL | Status: DC
  Administered 2020-11-07 – 2020-11-09 (×7): 10 mg via ORAL
  Filled 2020-11-07 (×7): qty 2

## 2020-11-07 MED ORDER — POTASSIUM CHLORIDE CRYS ER 20 MEQ PO TBCR
20.0000 meq | EXTENDED_RELEASE_TABLET | ORAL | Status: AC
  Administered 2020-11-07 (×3): 20 meq via ORAL
  Filled 2020-11-07 (×3): qty 1

## 2020-11-07 NOTE — Progress Notes (Signed)
Post-op day#2: MAP >70 Excellent urine output Still on norepi 5, dobutamine 2.5, hopefully can be weaned off slowly. Appreciate heart failure team input.   Elder Negus, MD Pager: [phone] Office: [phone]

## 2020-11-07 NOTE — Progress Notes (Signed)
      [address]       [place] [address]             [phone]                 2 Days Post-Op Procedure(s) (LRB): CORONARY ARTERY BYPASS GRAFTING (CABG) (N/A) TRANSESOPHAGEAL ECHOCARDIOGRAM (TEE) (N/A)   Events: No events  _______________________________________________________________ Vitals: BP 110/64   Pulse 78   Temp 99.5 F (37.5 C)   Resp (!) 22   Ht 6\' 1"  (1.854 m)   Wt 69.7 kg   SpO2 95%   BMI 20.27 kg/m   - Neuro: alert NAD  - Cardiovascular: sinus  Drips: dob 2.5, levo 5.   PAP: (7-16)/(3-12) 15/11 CVP:  [0 mmHg-6 mmHg] 6 mmHg  - Pulm: EWOB  ABG    Component Value Date/Time   PHART 7.325 (L) 11/05/2020 1855   PCO2ART 33.2 11/05/2020 1855   PO2ART 80 (L) 11/05/2020 1855   HCO3 17.2 (L) 11/05/2020 1855   TCO2 18 (L) 11/05/2020 1855   ACIDBASEDEF 8.0 (H) 11/05/2020 1855   O2SAT 64.0 11/07/2020 0400    - Abd: soft - Extremity: warm  .Intake/Output      05/11 0701 05/12 0700 05/12 0701 05/13 0700   I.V. (mL/kg) 1754.9 (25.2)    Blood     IV Piggyback 150    Total Intake(mL/kg) 1904.9 (27.3)    Urine (mL/kg/hr) 2775 (1.7)    Blood     Chest Tube 300    Total Output 3075    Net -1170.1            _______________________________________________________________ Labs: CBC Latest Ref Rng & Units 11/07/2020 11/06/2020 11/06/2020  WBC 4.0 - 10.5 K/uL 7.7 7.0 7.3  Hemoglobin 13.0 - 17.0 g/dL 8.1(L) 8.4(L) 8.8(L)  Hematocrit 39.0 - 52.0 % 24.2(L) 25.3(L) 25.9(L)  Platelets 150 - 400 K/uL 95(L) 112(L) 124(L)   CMP Latest Ref Rng & Units 11/07/2020 11/06/2020 11/06/2020  Glucose 70 - 99 mg/dL 01/06/2021) 130(Q) 657(Q)  BUN 8 - 23 mg/dL 13 13 15   Creatinine 0.61 - 1.24 mg/dL 469(G 2.95  Sodium 135 - 145 mmol/L 134(L) 131(L) 133(L)  Potassium 3.5 - 5.1 mmol/L 3.8 4.3 4.3  Chloride 98 - 111 mmol/L 101 101 107  CO2 22 - 32 mmol/L 26 25 22   Calcium 8.9 - 10.3 mg/dL 9.0 2.84) 1.32)  Total Protein 6.5 - 8.1 g/dL - - -  Total  Bilirubin 0.3 - 1.2 mg/dL - - -  Alkaline Phos 38 - 126 U/L - - -  AST 15 - 41 U/L - - -  ALT 0 - 44 U/L - - -    CXR: PV congestion  _______________________________________________________________  Assessment and Plan: POD 2 s/p CABG  Neuro: pain controlled CV: on dob, and levo.  Wean as tolerated.  Continue asp/statin Pulm: continue pulm toilet.  Will remove CTs Renal: good uop GI: on diet Heme: stable ID: afebrile Endo: SSI Dispo: continue ICU care   11/07/2020 7:42 AM

## 2020-11-07 NOTE — Progress Notes (Signed)
Patient ID: Troy Johnson, male   DOB: 16-May-1951, 70 y.o.   MRN: [id number]     Advanced Heart Failure Rounding Note  PCP-Cardiologist: None   Subjective:    CABG (5/10): LIMA-LAD, SVG-D2, SVG-OM1, SVG-PDA.   Chest is sore this morning. Good diuresis yesterday, weight down to baseline.     Creatinine stable 1.05, remains on dobutamine 2.5 and NE 5. Arterial line reads higher than Swan.   Swan numbers:  PA 15/\11 (not good waveform but CXR shows Swan in [place] - not wedged) RA 3 CI not done Co-ox 64%  Cardiac MRI:  1. Severe LV enlargement with severe global hypokinesis with regional variation. EF 14%. 2.  Small apical LV thrombus. 3.  Normal RV size with mildly decreased systolic function, EF 36%. 4.  Moderate mitral regurgitation. 5. Based on severe LV dysfunction, there was less late gadolinium enhancement than I would have expected. With the exception of the apical septal wall and the true apex, would expect the remainder of the LV myocardium to be viable.   Objective:   Weight Range: 69.7 kg Body mass index is 20.27 kg/m.   Vital Signs:   Temp:  [98.24 F (36.8 C)-100.04 F (37.8 C)] 99.5 F (37.5 C) (05/12 0600) Pulse Rate:  [78-99] 78 (05/12 0600) Resp:  [4-31] 22 (05/12 0600) BP: (85-128)/(55-82) 110/64 (05/12 0600) SpO2:  [95 %-100 %] 95 % (05/12 0600) Arterial Line BP: (98-149)/(37-57) 149/51 (05/12 0600) Weight:  [69.7 kg] 69.7 kg (05/12 0500) Last BM Date: 11/03/20  Weight change: Filed Weights   11/05/20 0534 11/06/20 0530 11/07/20 0500  Weight: 67.7 kg 72.8 kg 69.7 kg    Intake/Output:   Intake/Output Summary (Last 24 hours) at 11/07/2020 0912 Last data filed at 11/07/2020 0600 Gross per 24 hour  Intake 1782.01 ml  Output 3075 ml  Net -1292.99 ml      Physical Exam   General: NAD Neck: No JVD, no thyromegaly or thyroid nodule.  Lungs: Clear to auscultation bilaterally with normal respiratory effort. CV: Sternotomy.  Heart regular  S1/S2, no S3/S4, no murmur.  No peripheral edema.  No carotid bruit.  Normal pedal pulses.  Abdomen: Soft, nontender, no hepatosplenomegaly, no distention.  Skin: Intact without lesions or rashes.  Neurologic: Alert and oriented x 3.  Psych: Normal affect. Extremities: No clubbing or cyanosis.  HEENT: Normal.    Telemetry   NSR with rare PVCs rate 80s (personally reviewed)  Labs    CBC Recent Labs    11/06/20 1608 11/07/20 0413  WBC 7.0 7.7  HGB 8.4* 8.1*  HCT 25.3* 24.2*  MCV 97.3 98.4  PLT 112* 95*   Basic Metabolic Panel Recent Labs    40/98/11 0419 11/06/20 1608 11/07/20 0413  NA 133* 131* 134*  K 4.3 4.3 3.8  CL 107 101 101  CO2 22 25 26   GLUCOSE 122* 126* 113*  BUN 15 13 13   CREATININE 1.08 1.01 1.05  CALCIUM 8.5* 8.8* 9.0  MG 2.2 1.9  --    Liver Function Tests Recent Labs    11/05/20 0516  AST 79*  ALT 103*  ALKPHOS 49  BILITOT 0.3  PROT 6.4*  ALBUMIN 3.6   No results for input(s): LIPASE, AMYLASE in the last 72 hours. Cardiac Enzymes No results for input(s): CKTOTAL, CKMB, CKMBINDEX, TROPONINI in the last 72 hours.  BNP: BNP (last 3 results) Recent Labs    10/28/20 1433 11/02/20 0205  BNP 1,827.5* 1,261.6*    ProBNP (last 3 results)  No results for input(s): PROBNP in the last 8760 hours.   D-Dimer No results for input(s): DDIMER in the last 72 hours. Hemoglobin A1C No results for input(s): HGBA1C in the last 72 hours. Fasting Lipid Panel No results for input(s): CHOL, HDL, LDLCALC, TRIG, CHOLHDL, LDLDIRECT in the last 72 hours. Thyroid Function Tests No results for input(s): TSH, T4TOTAL, T3FREE, THYROIDAB in the last 72 hours.  Invalid input(s): FREET3  Other results:   Imaging    DG Chest Port 1 View  Result Date: 11/07/2020 CLINICAL DATA:  Chest tube in place EXAM: PORTABLE CHEST 1 VIEW COMPARISON:  Nov 06, 2020 FINDINGS: Swan-Ganz catheter tip is in the right main pulmonary artery. Chest tube remains on the left.  Mediastinal drains present. There is an apparent skin fold on the left laterally. There is no appreciable pneumothorax. There is a small right pleural effusion with equivocal left pleural effusion. There is bibasilar atelectasis. No appreciable consolidation. There is cardiomegaly with pulmonary vascularity within normal limits. No adenopathy. There is calcification in the aortic arch in each carotid artery region. IMPRESSION: Tube and catheter positions as described. Apparent skin fold on the left. No pneumothorax felt to be present. Cardiomegaly is stable. Small right pleural effusion with equivocal left pleural effusion. Bibasilar atelectasis. Aortic Atherosclerosis (ICD10-I70.0). There is bilateral carotid artery calcification. Electronically Signed   By: Bretta Bang III M.D.   On: 11/07/2020 08:09     Medications:     Scheduled Medications: . (feeding supplement) PROSource Plus  30 mL Oral BID BM  . acetaminophen  1,000 mg Oral Q6H   Or  . acetaminophen (TYLENOL) oral liquid 160 mg/5 mL  1,000 mg Per Tube Q6H  . aspirin EC  325 mg Oral Daily   Or  . aspirin  324 mg Per Tube Daily  . atorvastatin  80 mg Oral Daily  . bisacodyl  10 mg Oral Daily   Or  . bisacodyl  10 mg Rectal Daily  . Chlorhexidine Gluconate Cloth  6 each Topical Daily  . digoxin  0.125 mg Oral Daily  . docusate sodium  200 mg Oral Daily  . insulin aspart  0-24 Units Subcutaneous Q4H  . metoprolol tartrate  12.5 mg Oral BID   Or  . metoprolol tartrate  12.5 mg Per Tube BID  . midodrine  10 mg Oral TID WC  . multivitamin with minerals  1 tablet Oral Daily  . pantoprazole  40 mg Oral Daily  . potassium chloride  20 mEq Oral Q4H  . sodium chloride flush  10-40 mL Intracatheter Q12H  . sodium chloride flush  3 mL Intravenous Q12H    Infusions: . sodium chloride 20 mL/hr at 11/07/20 0600  . sodium chloride    . sodium chloride 20 mL/hr at 11/05/20 1400  . DOBUTamine 2.5 mcg/kg/min (11/07/20 0600)  .  lactated ringers    . lactated ringers    . lactated ringers 20 mL/hr at 11/07/20 0600  . norepinephrine (LEVOPHED) Adult infusion 5 mcg/min (11/07/20 0600)  . vasopressin      PRN Medications: sodium chloride, ipratropium-albuterol, lactated ringers, metoprolol tartrate, midazolam, morphine injection, ondansetron (ZOFRAN) IV, oxyCODONE, sodium chloride flush, sodium chloride flush, traMADol   Assessment/Plan   1. CAD: No chest pain, presented with dyspnea.  NSTEMI with HS-TnI elevated to peak 746.  Cath showed severe multivessel CAD with reasonable targets for CABG.  However, EF < 20%.  Cardiac MRI suggested extensive viability (except for apical septal wall and true  apex).  Now s/p CABG x 4 on 5/10.  - ASA + atorvastatin 80 mg daily.  2. Acute systolic CHF: Ischemic cardiomyopathy.  Echo this admission with EF <20%, the septum and apex were akinetic, RV function looked low normal, moderate MR.  Cardiac MRI showed EF 14% with scar in the apical septal wall and apex, mildly decreased RV systolic function. Suspect significant viability as above.  Now s/p CABG.   He is on NE 5 and dobutamine 2.5. Good MAP by arterial line, lower by cuff.  Good arterial waveform, suspect accurate. CVP 3.  - With low CVP and weight at baseline, hold off on diuretics today, probably start po Lasix tomorrow.  - Continue digoxin 0.125 daily.  - Continue dobutamine 2.5.  - Wean NE, would go by arterial line pressure.  OK to use midodrine, hope to stop in future.  - Add GDMT after weaning of pressor/inotrope.  - Would hope for improvement in function with revascularization.  3. LV thrombus: Small, noted on echo and cMRI.  - Would eventually start warfarin, timing per Dr. Cliffton Asters.  4. Mitral regurgitation: Likely functional.  Moderate.  5. Hyperlipidemia: Very high LDL.   - Atorvastatin 80 daily.  - May need Repatha in future.   CRITICAL CARE Performed by: Marca Ancona  Total critical care time: 35  minutes  Critical care time was exclusive of separately billable procedures and treating other patients.  Critical care was necessary to treat or prevent imminent or life-threatening deterioration.  Critical care was time spent personally by me on the following activities: development of treatment plan with patient and/or surrogate as well as nursing, discussions with consultants, evaluation of patient's response to treatment, examination of patient, obtaining history from patient or surrogate, ordering and performing treatments and interventions, ordering and review of laboratory studies, ordering and review of radiographic studies, pulse oximetry and re-evaluation of patient's condition.   Length of Stay: 10  Marca Ancona, MD  11/07/2020, 9:12 AM  Advanced Heart Failure Team Pager [phone] (M-F; 7a - 5p)  Please contact [facility] for night-coverage after hours (5p -7a ) and weekends on [email or web address]

## 2020-11-08 DIAGNOSIS — I5023 Acute on chronic systolic (congestive) heart failure: Secondary | ICD-10-CM | POA: Diagnosis not present

## 2020-11-08 LAB — TYPE AND SCREEN
ABO/RH(D): A POS
Antibody Screen: NEGATIVE
Unit division: 0
Unit division: 0

## 2020-11-08 LAB — COOXEMETRY PANEL
Carboxyhemoglobin: 1.3 % (ref 0.5–1.5)
Methemoglobin: 1 % (ref 0.0–1.5)
O2 Saturation: 64.6 %
Total hemoglobin: 7.9 g/dL — ABNORMAL LOW (ref 12.0–16.0)

## 2020-11-08 LAB — CBC
HCT: 23.1 % — ABNORMAL LOW (ref 39.0–52.0)
Hemoglobin: 7.6 g/dL — ABNORMAL LOW (ref 13.0–17.0)
MCH: 32.8 pg (ref 26.0–34.0)
MCHC: 32.9 g/dL (ref 30.0–36.0)
MCV: 99.6 fL (ref 80.0–100.0)
Platelets: 89 10*3/uL — ABNORMAL LOW (ref 150–400)
RBC: 2.32 MIL/uL — ABNORMAL LOW (ref 4.22–5.81)
RDW: 15.3 % (ref 11.5–15.5)
WBC: 6.5 10*3/uL (ref 4.0–10.5)
nRBC: 0 % (ref 0.0–0.2)

## 2020-11-08 LAB — GLUCOSE, CAPILLARY
Glucose-Capillary: 109 mg/dL — ABNORMAL HIGH (ref 70–99)
Glucose-Capillary: 115 mg/dL — ABNORMAL HIGH (ref 70–99)
Glucose-Capillary: 92 mg/dL (ref 70–99)
Glucose-Capillary: 94 mg/dL (ref 70–99)
Glucose-Capillary: 98 mg/dL (ref 70–99)
Glucose-Capillary: 99 mg/dL (ref 70–99)

## 2020-11-08 LAB — BPAM RBC
Blood Product Expiration Date: 202205282359
Blood Product Expiration Date: 202205282359
Unit Type and Rh: 6200
Unit Type and Rh: 6200

## 2020-11-08 LAB — BASIC METABOLIC PANEL
Anion gap: 5 (ref 5–15)
BUN: 14 mg/dL (ref 8–23)
CO2: 27 mmol/L (ref 22–32)
Calcium: 8.8 mg/dL — ABNORMAL LOW (ref 8.9–10.3)
Chloride: 102 mmol/L (ref 98–111)
Creatinine, Ser: 1.1 mg/dL (ref 0.61–1.24)
GFR, Estimated: >60 mL/min (ref >60)
Glucose, Bld: 95 mg/dL (ref 70–99)
Potassium: 4.2 mmol/L (ref 3.5–5.1)
Sodium: 134 mmol/L — ABNORMAL LOW (ref 135–145)

## 2020-11-08 LAB — HEPARIN LEVEL (UNFRACTIONATED): Heparin Unfractionated: <0.1 IU/mL — ABNORMAL LOW (ref 0.30–0.70)

## 2020-11-08 MED ORDER — HEPARIN (PORCINE) 25000 UT/250ML-% IV SOLN
900.0000 [IU]/h | INTRAVENOUS | Status: DC
  Administered 2020-11-08: 700 [IU]/h via INTRAVENOUS
  Filled 2020-11-08: qty 250

## 2020-11-08 MED ORDER — WARFARIN - PHARMACIST DOSING INPATIENT: Freq: Every day | Status: DC

## 2020-11-08 MED ORDER — WARFARIN SODIUM 5 MG PO TABS
5.0000 mg | ORAL_TABLET | Freq: Once | ORAL | Status: AC
  Administered 2020-11-08: 5 mg via ORAL
  Filled 2020-11-08: qty 1

## 2020-11-08 MED ORDER — ENSURE ENLIVE PO LIQD
237.0000 mL | Freq: Two times a day (BID) | ORAL | Status: DC
  Administered 2020-11-09 – 2020-11-13 (×8): 237 mL via ORAL

## 2020-11-08 MED ORDER — ENSURE ENLIVE PO LIQD
237.0000 mL | Freq: Three times a day (TID) | ORAL | Status: DC
  Administered 2020-11-08: 237 mL via ORAL

## 2020-11-08 MED ORDER — INSULIN ASPART 100 UNIT/ML IJ SOLN
0.0000 [IU] | Freq: Three times a day (TID) | INTRAMUSCULAR | Status: DC
  Administered 2020-11-10: 2 [IU] via SUBCUTANEOUS

## 2020-11-08 NOTE — Progress Notes (Signed)
ANTICOAGULATION CONSULT NOTE - Follow Up Consult  Pharmacy Consult for Heparin Indication: LV apical thrombus  Allergies  Allergen Reactions  . Penicillins Other (See Comments)    childhood    Patient Measurements: Height: 6\' 1"  (185.4 cm) Weight: 69.9 kg (154 lb) IBW/kg (Calculated) : 79.9 kg Heparin Dosing Weight: 77 kg  Vital Signs: Temp: 97.88 F (36.6 C) (05/13 0800) Temp Source: Core (05/13 0800) BP: 86/64 (05/13 0800) Pulse Rate: 89 (05/13 0800)  Labs: Recent Labs    11/05/20 1420 11/05/20 1743 11/06/20 1608 11/07/20 0413 11/08/20 0418  HGB 9.1*  8.5*   < > 8.4* 8.1* 7.6*  HCT 27.5*  25.0*   < > 25.3* 24.2* 23.1*  PLT 111*   < > 112* 95* 89*  APTT 48*  --   --   --   --   LABPROT 16.3*  --   --   --   --   INR 1.3*  --   --   --   --   CREATININE  --    < > 1.01 1.05 1.10   < > = values in this interval not displayed.    Estimated Creatinine Clearance: 62.7 mL/min (by C-G formula based on SCr of 1.1 mg/dL).   Medical History: Past Medical History:  Diagnosis Date  . Anemia   . Asthma    childhood  . Blood in stool   . Childhood asthma   . GI bleed   . Kidney laceration 1999  . Seizures [facility])    Assessment: 70 yo male presented with SOB and found to have mvCAD and an apical thrombus. PTA the patient is not on anticoagulation. 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 CT output down, CBC stable - pharmacy asked to start IV heparin and warfarin. Pt previously therapeutic on 1150 units/h of heparin - given recent OSH will target lower end of heparin goal and defer bolus and start drip rate lower.  Goal of Therapy:  Heparin level 0.3-0.5 units/ml Monitor platelets by anticoagulation protocol: Yes   Plan:  -Heparin 700 units/h - no bolus -Warfarin 5mg  PO x1 tonight -Daily INR -Heparin level in 6h   78, PharmD, [place], [facility] Clinical Pharmacist [phone] Please check AMION for all [facility] Pharmacy numbers 11/08/2020

## 2020-11-08 NOTE — Progress Notes (Addendum)
Patient ID: Troy Johnson, male   DOB: [birth year], 70 y.o.   MRN: [id number]     Advanced Heart Failure Rounding Note  PCP-Cardiologist: None   Subjective:    CABG (5/10): LIMA-LAD, SVG-D2, SVG-OM1, SVG-PDA.   Off NE. Remains on DBA 2.5. Co-ox 65%  Diuretics held yesterday w/ low CVP. Wt up 1 lb but still close to pre-op wt. CVP still low today 2-3. Dizzy w/ standing. SBPs low 100s-110. On midodrine 10 tid.   SCr stable 1.10   Denies CP. No dyspnea.   Swan numbers:  PA 18/14 (16) RA 2-3  CI 2.5 Co-ox 65%  Cardiac MRI:  1. Severe LV enlargement with severe global hypokinesis with regional variation. EF 14%. 2.  Small apical LV thrombus. 3.  Normal RV size with mildly decreased systolic function, EF 36%. 4.  Moderate mitral regurgitation. 5. Based on severe LV dysfunction, there was less late gadolinium enhancement than I would have expected. With the exception of the apical septal wall and the true apex, would expect the remainder of the LV myocardium to be viable.   Objective:   Weight Range: 69.9 kg Body mass index is 20.32 kg/m.   Vital Signs:   Temp:  [98.06 F (36.7 C)-99.14 F (37.3 C)] 98.96 F (37.2 C) (05/13 0600) Pulse Rate:  [65-107] 99 (05/13 0600) Resp:  [11-26] 23 (05/13 0600) BP: (80-138)/(45-110) 93/59 (05/13 0600) SpO2:  [90 %-100 %] 97 % (05/13 0600) Arterial Line BP: (104-150)/(42-62) 120/48 (05/13 0600) Weight:  [69.9 kg] 69.9 kg (05/13 0500) Last BM Date: 11/03/20  Weight change: Filed Weights   11/06/20 0530 11/07/20 0500 11/08/20 0500  Weight: 72.8 kg 69.7 kg 69.9 kg    Intake/Output:   Intake/Output Summary (Last 24 hours) at 11/08/2020 5573 Last data filed at 11/08/2020 0600 Gross per 24 hour  Intake 1345.97 ml  Output 1825 ml  Net -479.03 ml      Physical Exam   PHYSICAL EXAM: CVP 2-3 General:  Mildly fatigued appearing. Sitting in chair. No respiratory difficulty HEENT: normal Neck: supple. no JVD. + Rt IJ swan  Carotids 2+ bilat; no bruits. No lymphadenopathy or thyromegaly appreciated. Cor: PMI nondisplaced. Regular rate & rhythm. No rubs, gallops or murmurs. + CTs + sternal dressing  Lungs: clear Abdomen: soft, nontender, nondistended. No hepatosplenomegaly. No bruits or masses. Good bowel sounds. Extremities: no cyanosis, clubbing, rash, edema Neuro: alert & oriented x 3, cranial nerves grossly intact. moves all 4 extremities w/o difficulty. Affect pleasant.   Telemetry   NSR with rare PVCs rate 90s (personally reviewed)  Labs    CBC Recent Labs    11/06/20 1608 11/07/20 0413  WBC 7.0 7.7  HGB 8.4* 8.1*  HCT 25.3* 24.2*  MCV 97.3 98.4  PLT 112* 95*   Basic Metabolic Panel Recent Labs    22/02/54 0419 11/06/20 1608 11/07/20 0413 11/08/20 0418  NA 133* 131* 134* 134*  K 4.3 4.3 3.8 4.2  CL 107 101 101 102  CO2 22 25 26 27   GLUCOSE 122* 126* 113* 95  BUN 15 13 13 14   CREATININE 1.08 1.01 1.05 1.10  CALCIUM 8.5* 8.8* 9.0 8.8*  MG 2.2 1.9  --   --    Liver Function Tests No results for input(s): AST, ALT, ALKPHOS, BILITOT, PROT, ALBUMIN in the last 72 hours. No results for input(s): LIPASE, AMYLASE in the last 72 hours. Cardiac Enzymes No results for input(s): CKTOTAL, CKMB, CKMBINDEX, TROPONINI in the last 72 hours.  BNP:  BNP (last 3 results) Recent Labs    10/28/20 1433 11/02/20 0205  BNP 1,827.5* 1,261.6*    ProBNP (last 3 results) No results for input(s): PROBNP in the last 8760 hours.   D-Dimer No results for input(s): DDIMER in the last 72 hours. Hemoglobin A1C No results for input(s): HGBA1C in the last 72 hours. Fasting Lipid Panel No results for input(s): CHOL, HDL, LDLCALC, TRIG, CHOLHDL, LDLDIRECT in the last 72 hours. Thyroid Function Tests No results for input(s): TSH, T4TOTAL, T3FREE, THYROIDAB in the last 72 hours.  Invalid input(s): FREET3  Other results:   Imaging    No results found.   Medications:     Scheduled  Medications: . (feeding supplement) PROSource Plus  30 mL Oral BID BM  . acetaminophen  1,000 mg Oral Q6H   Or  . acetaminophen (TYLENOL) oral liquid 160 mg/5 mL  1,000 mg Per Tube Q6H  . aspirin EC  325 mg Oral Daily   Or  . aspirin  324 mg Per Tube Daily  . atorvastatin  80 mg Oral Daily  . bisacodyl  10 mg Oral Daily   Or  . bisacodyl  10 mg Rectal Daily  . Chlorhexidine Gluconate Cloth  6 each Topical Daily  . digoxin  0.125 mg Oral Daily  . docusate sodium  200 mg Oral Daily  . insulin aspart  0-24 Units Subcutaneous Q4H  . mouth rinse  15 mL Mouth Rinse BID  . midodrine  10 mg Oral TID WC  . multivitamin with minerals  1 tablet Oral Daily  . pantoprazole  40 mg Oral Daily  . sodium chloride flush  10-40 mL Intracatheter Q12H  . sodium chloride flush  3 mL Intravenous Q12H    Infusions: . sodium chloride Stopped (11/07/20 1010)  . sodium chloride    . sodium chloride 10 mL/hr at 11/07/20 1645  . DOBUTamine 2.5 mcg/kg/min (11/08/20 0600)  . lactated ringers    . lactated ringers    . lactated ringers 20 mL/hr at 11/08/20 0600  . norepinephrine (LEVOPHED) Adult infusion Stopped (11/07/20 1616)  . vasopressin      PRN Medications: sodium chloride, ipratropium-albuterol, lactated ringers, metoprolol tartrate, midazolam, morphine injection, ondansetron (ZOFRAN) IV, oxyCODONE, sodium chloride flush, sodium chloride flush, traMADol   Assessment/Plan   1. CAD: No chest pain, presented with dyspnea.  NSTEMI with HS-TnI elevated to peak 746.  Cath showed severe multivessel CAD with reasonable targets for CABG.  However, EF < 20%.  Cardiac MRI suggested extensive viability (except for apical septal wall and true apex).  Now s/p CABG x 4 on 5/10.  - ASA + atorvastatin 80 mg daily.  2. Acute systolic CHF: Ischemic cardiomyopathy.  Echo this admission with EF <20%, the septum and apex were akinetic, RV function looked low normal, moderate MR.  Cardiac MRI showed EF 14% with scar in  the apical septal wall and apex, mildly decreased RV systolic function. Suspect significant viability as above.  Now s/p CABG.  Now off NE. On Dobutamine 2.5. Co-ox 65%. CVP remains low 2-3 and back to pre-op wt - With low CVP and weight at baseline, continue to hold diuretics today, probably start po Lasix tomorrow.  - Continue digoxin 0.125 daily.  - Stop DBA today and follow co-ox  - Continue midodrine for BP support, hope to stop in future.  - Add GDMT after weaning of pressor/inotrope.  - Would hope for improvement in function with revascularization.  3. LV thrombus: Small, noted  on echo and cMRI.  - Would eventually start warfarin, timing per Dr. Cliffton Asters.  4. Mitral regurgitation: Likely functional.  Moderate.  5. Hyperlipidemia: Very high LDL.   - Atorvastatin 80 daily.  - May need Repatha in future.  6. Thrombocytopenia: Post-op, stable.  Has not been on heparin.   Length of Stay: [address], PA-C  11/08/2020, 7:12 AM  Advanced Heart Failure Team Pager [phone] (M-F; 7a - 5p)  Please contact [facility] for night-coverage after hours (5p -7a ) and weekends on [email or web address]  Patient seen with PA, agree with the above note.   Co-ox 65%, CVP remains low at 2-3 and weight near pre-op.  He feels better today.  Off NE, remains on dobutamine 2.5.   General: NAD Neck: No JVD, no thyromegaly or thyroid nodule.  Lungs: Clear to auscultation bilaterally with normal respiratory effort. CV: Nondisplaced PMI.  Heart regular S1/S2, no S3/S4, no murmur.  No peripheral edema.   Abdomen: Soft, nontender, no hepatosplenomegaly, no distention.  Skin: Intact without lesions or rashes.  Neurologic: Alert and oriented x 3.  Psych: Normal affect. Extremities: No clubbing or cyanosis.  HEENT: Normal.   Decrease dobutamine to 1.5 today, hopefully off tomorrow. Can remove Swan, leave introducer to follow CVP and co-ox.   No Lasix today, may need po soon.   Continue digoxin 0.125  and midodrine, hope to wean off midodrine soon.   Pre-op LV thrombus => will ask surgery when we can restart anticoagulation (suspect today).   Ambulate.   Marca Ancona 11/08/2020 8:12 AM

## 2020-11-08 NOTE — Progress Notes (Signed)
TCTS Evening Rounds  POD #3 s/p CABG De-lined today; remains on Dobut and hep gtt PE: BP 106/69   Pulse 98   Temp (!) 97.5 F (36.4 C)   Resp 11   Ht 6\' 1"  (1.854 m)   Wt 69.9 kg   SpO2 95%   BMI 20.32 kg/m  Alert/cooperative Mildly tachy CTA Incision c/d/i   Intake/Output Summary (Last 24 hours) at 11/08/2020 1812 Last data filed at 11/08/2020 1800 Gross per 24 hour  Intake 981.13 ml  Output 1360 ml  Net -378.87 ml   A/p: Continue slow Dobut wean Diuresis Troy Johnson Z. 11/10/2020, MD [phone]

## 2020-11-08 NOTE — Progress Notes (Addendum)
      [address]       [place] [address]             [phone]                 3 Days Post-Op Procedure(s) (LRB): CORONARY ARTERY BYPASS GRAFTING (CABG) (N/A) TRANSESOPHAGEAL ECHOCARDIOGRAM (TEE) (N/A)   Events: No events  _______________________________________________________________ Vitals: BP 110/69   Pulse 96   Temp (!) 97.5 F (36.4 C)   Resp 11   Ht 6\' 1"  (1.854 m)   Wt 69.9 kg   SpO2 94%   BMI 20.32 kg/m   - Neuro: alert NAD  - Cardiovascular: sinus  Drips: dob 2.5, levo 5.   PAP: (11-26)/(5-23) 16/13 CVP:  [0 mmHg-47 mmHg] 2 mmHg CO:  [4.8 L/min-6.3 L/min] 4.8 L/min CI:  [2.5 L/min/m2-3.3 L/min/m2] 2.5 L/min/m2  - Pulm: EWOB  ABG    Component Value Date/Time   PHART 7.325 (L) 11/05/2020 1855   PCO2ART 33.2 11/05/2020 1855   PO2ART 80 (L) 11/05/2020 1855   HCO3 17.2 (L) 11/05/2020 1855   TCO2 18 (L) 11/05/2020 1855   ACIDBASEDEF 8.0 (H) 11/05/2020 1855   O2SAT 64.6 11/08/2020 0418    - Abd: soft - Extremity: warm  .Intake/Output      05/12 0701 05/13 0700 05/13 0701 05/14 0700   P.O. 500 120   I.V. (mL/kg) 978.5 (14) 92.3 (1.3)   IV Piggyback     Total Intake(mL/kg) 1478.5 (21.2) 212.3 (3)   Urine (mL/kg/hr) 1575 (0.9) 0 (0)   Stool  0   Chest Tube 250 40   Total Output 1825 40   Net -346.5 +172.3        Urine Occurrence  1 x   Stool Occurrence  1 x      _______________________________________________________________ Labs: CBC Latest Ref Rng & Units 11/08/2020 11/07/2020 11/06/2020  WBC 4.0 - 10.5 K/uL 6.5 7.7 7.0  Hemoglobin 13.0 - 17.0 g/dL 7.6(L) 8.1(L) 8.4(L)  Hematocrit 39.0 - 52.0 % 23.1(L) 24.2(L) 25.3(L)  Platelets 150 - 400 K/uL 89(L) 95(L) 112(L)   CMP Latest Ref Rng & Units 11/08/2020 11/07/2020 11/06/2020  Glucose 70 - 99 mg/dL 95 01/06/2021) 370(W)  BUN 8 - 23 mg/dL 14 13 13   Creatinine 0.61 - 1.24 mg/dL 888(B 1.69  Sodium 135 - 145 mmol/L 134(L) 134(L) 131(L)  Potassium 3.5 - 5.1 mmol/L 4.2 3.8  4.3  Chloride 98 - 111 mmol/L 102 101 101  CO2 22 - 32 mmol/L 27 26 25   Calcium 8.9 - 10.3 mg/dL 4.50) 9.0 3.88)  Total Protein 6.5 - 8.1 g/dL - - -  Total Bilirubin 0.3 - 1.2 mg/dL - - -  Alkaline Phos 38 - 126 U/L - - -  AST 15 - 41 U/L - - -  ALT 0 - 44 U/L - - -    CXR: PV congestion  _______________________________________________________________  Assessment and Plan: POD 3 s/p CABG overall doing well  Neuro: pain controlled CV: on dob.  Will remove swan and a line today.  Wean dob as tolerated  Continue asp/statin.  Starting eliquis for LV thrombus Pulm: continue pulm toilet.  Will remove CTs Renal: good uop GI: on diet Heme: stable ID: [email or web address] Endo: SSI Dispo: continue ICU care   Troy Johnson 11/08/2020 2:03 PM

## 2020-11-08 NOTE — Progress Notes (Signed)
ANTICOAGULATION CONSULT NOTE - Follow Up Consult  Pharmacy Consult for Heparin Indication: LV apical thrombus  Allergies  Allergen Reactions  . Penicillins Other (See Comments)    childhood    Patient Measurements: Height: 6\' 1"  (185.4 cm) Weight: 69.9 kg (154 lb) IBW/kg (Calculated) : 79.9 kg Heparin Dosing Weight: 77 kg  Vital Signs: Temp: 97.5 F (36.4 C) (05/13 1100) Temp Source: Core (05/13 0800) BP: 106/69 (05/13 1800) Pulse Rate: 98 (05/13 1800)  Labs: Recent Labs    11/06/20 1608 11/07/20 0413 11/08/20 0418 11/08/20 1617  HGB 8.4* 8.1* 7.6*  --   HCT 25.3* 24.2* 23.1*  --   PLT 112* 95* 89*  --   HEPARINUNFRC  --   --   --  <0.10*  CREATININE 1.01 1.05 1.10  --     Estimated Creatinine Clearance: 62.7 mL/min (by C-G formula based on SCr of 1.1 mg/dL).   Medical History: Past Medical History:  Diagnosis Date  . Anemia   . Asthma    childhood  . Blood in stool   . Childhood asthma   . GI bleed   . Kidney laceration 1999  . Seizures [facility])    Assessment: 70 yo male presented with SOB and found to have mvCAD and an apical thrombus. PTA the patient is not on anticoagulation. 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 CT output down, CBC stable - pharmacy asked to start IV heparin and warfarin. Pt previously therapeutic on 1150 units/h of heparin - given recent OSH will target lower end of heparin goal and defer bolus and start drip rate lower. Heparin drip 700 uts/hr HL 0.1 undetectable  No bleeding noted, Chest tubes removed  Goal of Therapy:  Heparin level 0.3-0.5 units/ml Monitor platelets by anticoagulation protocol: Yes   Plan:  -Increase heparin drip 800 units/h  - daily Heparin level, CBC and Protime    78 Pharm.D. CPP, BCPS Clinical Pharmacist [phone] 11/08/2020 6:29 PM    Please check AMION for all [facility] Pharmacy numbers 11/08/2020

## 2020-11-08 NOTE — Progress Notes (Signed)
Nutrition Follow Up  DOCUMENTATION CODES:   Non-severe (moderate) malnutrition in context of chronic illness  INTERVENTION:    Ensure Enlive po BID, each supplement provides 350 kcal and 20 grams of protein  MVI daily   NUTRITION DIAGNOSIS:   Moderate Malnutrition related to chronic illness as evidenced by moderate fat depletion,severe muscle depletion.  GOAL:   Patient will meet greater than or equal to 90% of their needs  MONITOR:   PO intake,Supplement acceptance,Weight trends,I & O's  REASON FOR ASSESSMENT:   Consult Assessment of nutrition requirement/status  ASSESSMENT:   70 yo male with a PMH of HTN who presents with acute systolic CHF, CAD, HLD, and LV thrombus. Plan for CABG on 5/10.   5/10- s/p CABG x4  Pt discussed during ICU rounds and with RN.   Diet advanced to regular this morning. Last two meal completions charted as 75% and 50% (on clear liquid diet). Patient eager to eat lunch. Discussed the importance of protein intake to promote post op healing.   Repeat NFPE shows moderate fat depletion and severe muscle depletion.   Admission weight: 70.9 kg  Current weight: 69.9 kg   UOP: 1575 ml x 24 hrs Chest tubes: 250 ml x 24 hrs    Drips: dobutrex Medications: dulcolax, colace, SS novolog Labs: Na 134 (L) CBG 98-129  Flowsheet Row Most Recent Value  Orbital Region Moderate depletion  Upper Arm Region Mild depletion  Thoracic and Lumbar Region Unable to assess  Buccal Region Moderate depletion  Temple Region Severe depletion  Clavicle Bone Region Severe depletion  Clavicle and Acromion Bone Region Severe depletion  Scapular Bone Region Unable to assess  Dorsal Hand Mild depletion  Patellar Region Severe depletion  Anterior Thigh Region Severe depletion  Posterior Calf Region Moderate depletion  Edema (RD Assessment) Mild  Hair Reviewed  Eyes Reviewed  Mouth Reviewed  Skin Reviewed  Nails Reviewed     Diet Order:   Diet Order             Diet regular Room service appropriate? Yes; Fluid consistency: Thin  Diet effective now                EDUCATION NEEDS:   Education needs have been addressed  Skin:  Skin Assessment: Skin Integrity Issues: Skin Integrity Issues:: Incisions Incisions: chest, legs  Last BM:  5/8  Height:  Ht Readings from Last 1 Encounters:  10/28/20 6\' 1"  (1.854 m)   Weight:  Wt Readings from Last 1 Encounters:  11/08/20 69.9 kg   Ideal Body Weight:  83.6 kg  BMI:  Body mass index is 20.32 kg/m.  Estimated Nutritional Needs:   Kcal:  2000-2200 kcal   Protein:  100-120 grams   Fluid:  >/= 2 L/day  11/10/20 RD, LDN Clinical Nutrition Pager listed in AMION

## 2020-11-08 NOTE — Evaluation (Signed)
Physical Therapy Evaluation Patient Details Name: Troy Johnson MRN: [id number] DOB: 07-Mar-1951 Today's Date: 11/08/2020   History of Present Illness  70 yo admitted 5/2 with SOB with acute CHF and NSTEMI. Cath 5/3. CABG x 4 5/10. PMhx: HTN, asthma, seizure  Clinical Impression  Pt very pleasant and moving well. Pt educated for sternal precautions with handout provided. Pt without dizziness or SOB during session and able to walk to bathroom for BM. Pt with decreased activity tolerance, transfers, independence and adherence to precautions who will benefit from acute therapy to maximize mobility and safety prior to D/C. Pt reports family are planning to come and assist him at D/C but will need to be able to access bedroom on 2nd floor.   Pre gait 106/70 (80) Post gait 132/73 (88) HR 97-104     Follow Up Recommendations No PT follow up;Supervision - Intermittent    Equipment Recommendations  3in1 (PT);Rolling Mertens with 5" wheels    Recommendations for Other Services       Precautions / Restrictions Precautions Precautions: Sternal Precaution Booklet Issued: Yes (comment)      Mobility  Bed Mobility Overal bed mobility: Needs Assistance Bed Mobility: Rolling;Sidelying to Sit Rolling: Supervision Sidelying to sit: Supervision       General bed mobility comments: cues for sequence, technique and precautions    Transfers Overall transfer level: Needs assistance   Transfers: Sit to/from Stand Sit to Stand: Supervision         General transfer comment: cues for hand placement and backing fully to surface  Ambulation/Gait Ambulation/Gait assistance: Supervision Gait Distance (Feet): 140 Feet Assistive device: Rolling Seipp (2 wheeled) Gait Pattern/deviations: Step-through pattern;Decreased stride length   Gait velocity interpretation: 1.31 - 2.62 ft/sec, indicative of limited community ambulator General Gait Details: cues for proximity to [employer]    Modified Rankin (Stroke Patients Only)       Balance Overall balance assessment: Mild deficits observed, not formally tested                                           Pertinent Vitals/Pain Pain Assessment: No/denies pain    Home Living Family/patient expects to be discharged to:: Private residence Living Arrangements: Alone Available Help at Discharge: Family;Available 24 hours/day Type of Home: House Home Access: Level entry     Home Layout: Two level;Bed/bath upstairs Home Equipment: None      Prior Function Level of Independence: Independent         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        Extremity/Trunk Assessment   Upper Extremity Assessment Upper Extremity Assessment: Overall WFL for tasks assessed    Lower Extremity Assessment Lower Extremity Assessment: Generalized weakness    Cervical / Trunk Assessment Cervical / Trunk Assessment: Normal  Communication   Communication: No difficulties  Cognition Arousal/Alertness: Awake/alert Behavior During Therapy: WFL for tasks assessed/performed Overall Cognitive Status: Within Functional Limits for tasks assessed                                        General Comments      Exercises General Exercises - Lower Extremity Long Arc Quad: AROM;Both;10  reps;Seated Hip Flexion/Marching: AROM;10 reps;Seated;Both   Assessment/Plan    PT Assessment Patient needs continued PT services  PT Problem List Decreased activity tolerance;Decreased balance;Decreased mobility;Decreased knowledge of use of DME;Decreased knowledge of precautions       PT Treatment Interventions DME instruction;Functional mobility training;Patient/family education;Gait training;Therapeutic activities;Stair training;Therapeutic exercise    PT Goals (Current goals can be found in the Care Plan section)  Acute Rehab PT Goals Patient Stated  Goal: return home PT Goal Formulation: With patient Time For Goal Achievement: 11/22/20 Potential to Achieve Goals: Good    Frequency Min 3X/week   Barriers to discharge Decreased caregiver support      Co-evaluation               AM-PAC PT "6 Clicks" Mobility  Outcome Measure Help needed turning from your back to your side while in a flat bed without using bedrails?: A Little Help needed moving from lying on your back to sitting on the side of a flat bed without using bedrails?: A Little Help needed moving to and from a bed to a chair (including a wheelchair)?: A Little Help needed standing up from a chair using your arms (e.g., wheelchair or bedside chair)?: A Little Help needed to walk in hospital room?: A Little Help needed climbing 3-5 steps with a railing? : A Lot 6 Click Score: 17    End of Session   Activity Tolerance: Patient tolerated treatment well Patient left: in chair;with call bell/phone within reach Nurse Communication: Mobility status;Precautions PT Visit Diagnosis: Other abnormalities of gait and mobility (R26.89)    Time: 1157-2620 PT Time Calculation (min) (ACUTE ONLY): 25 min   Charges:   PT Evaluation $PT Eval Moderate Complexity: 1 Mod PT Treatments $Gait Training: 8-22 mins        Mouna Yager P, PT Acute Rehabilitation Services Pager: [phone] Office: [phone]   Enedina Finner Brandyn Thien 11/08/2020, 3:35 PM

## 2020-11-09 ENCOUNTER — Inpatient Hospital Stay (HOSPITAL_COMMUNITY): Payer: Medicare Other

## 2020-11-09 DIAGNOSIS — I5021 Acute systolic (congestive) heart failure: Secondary | ICD-10-CM | POA: Diagnosis not present

## 2020-11-09 LAB — HEPARIN LEVEL (UNFRACTIONATED)
Heparin Unfractionated: 0.17 IU/mL — ABNORMAL LOW (ref 0.30–0.70)
Heparin Unfractionated: <0.1 IU/mL — ABNORMAL LOW (ref 0.30–0.70)
Heparin Unfractionated: 0.3 IU/mL (ref 0.30–0.70)

## 2020-11-09 LAB — GLUCOSE, CAPILLARY
Glucose-Capillary: 95 mg/dL (ref 70–99)
Glucose-Capillary: 138 mg/dL — ABNORMAL HIGH (ref 70–99)
Glucose-Capillary: 93 mg/dL (ref 70–99)
Glucose-Capillary: 92 mg/dL (ref 70–99)
Glucose-Capillary: 98 mg/dL (ref 70–99)

## 2020-11-09 LAB — CBC
HCT: 24 % — ABNORMAL LOW (ref 39.0–52.0)
Hemoglobin: 8 g/dL — ABNORMAL LOW (ref 13.0–17.0)
MCH: 32.8 pg (ref 26.0–34.0)
MCHC: 33.3 g/dL (ref 30.0–36.0)
MCV: 98.4 fL (ref 80.0–100.0)
Platelets: 124 10*3/uL — ABNORMAL LOW (ref 150–400)
RBC: 2.44 MIL/uL — ABNORMAL LOW (ref 4.22–5.81)
RDW: 15 % (ref 11.5–15.5)
WBC: 6.2 10*3/uL (ref 4.0–10.5)
nRBC: 0 % (ref 0.0–0.2)

## 2020-11-09 LAB — BASIC METABOLIC PANEL
Anion gap: 10 (ref 5–15)
BUN: 14 mg/dL (ref 8–23)
CO2: 26 mmol/L (ref 22–32)
Calcium: 8.5 mg/dL — ABNORMAL LOW (ref 8.9–10.3)
Chloride: 99 mmol/L (ref 98–111)
Creatinine, Ser: 1.11 mg/dL (ref 0.61–1.24)
GFR, Estimated: >60 mL/min (ref >60)
Glucose, Bld: 105 mg/dL — ABNORMAL HIGH (ref 70–99)
Potassium: 3.6 mmol/L (ref 3.5–5.1)
Sodium: 135 mmol/L (ref 135–145)

## 2020-11-09 LAB — PROTIME-INR
INR: 1.1 (ref 0.8–1.2)
Prothrombin Time: 13.8 seconds (ref 11.4–15.2)

## 2020-11-09 LAB — COOXEMETRY PANEL
Carboxyhemoglobin: 1.2 % (ref 0.5–1.5)
Methemoglobin: 0.9 % (ref 0.0–1.5)
O2 Saturation: 55.2 %
Total hemoglobin: 8.1 g/dL — ABNORMAL LOW (ref 12.0–16.0)

## 2020-11-09 MED ORDER — MIDODRINE HCL 5 MG PO TABS
5.0000 mg | ORAL_TABLET | Freq: Three times a day (TID) | ORAL | Status: DC
  Administered 2020-11-09 – 2020-11-10 (×4): 5 mg via ORAL
  Filled 2020-11-09 (×4): qty 1

## 2020-11-09 MED ORDER — WARFARIN SODIUM 5 MG PO TABS: 5.0000 mg | ORAL_TABLET | Freq: Once | ORAL | Status: DC

## 2020-11-09 MED ORDER — HEPARIN (PORCINE) 25000 UT/250ML-% IV SOLN
1100.0000 [IU]/h | INTRAVENOUS | Status: DC
  Administered 2020-11-09: 1000 [IU]/h via INTRAVENOUS
  Administered 2020-11-10: 900 [IU]/h via INTRAVENOUS
  Administered 2020-11-12: 1000 [IU]/h via INTRAVENOUS
  Administered 2020-11-13: 1200 [IU]/h via INTRAVENOUS
  Administered 2020-11-14: 1100 [IU]/h via INTRAVENOUS
  Filled 2020-11-09 (×5): qty 250

## 2020-11-09 MED ORDER — POTASSIUM CHLORIDE CRYS ER 20 MEQ PO TBCR
20.0000 meq | EXTENDED_RELEASE_TABLET | ORAL | Status: AC
  Administered 2020-11-09 (×3): 20 meq via ORAL
  Filled 2020-11-09 (×3): qty 1

## 2020-11-09 NOTE — Progress Notes (Signed)
ANTICOAGULATION CONSULT NOTE - Follow Up Consult  Pharmacy Consult for Heparin Indication: LV apical thrombus  Allergies  Allergen Reactions  . Penicillins Other (See Comments)    childhood    Patient Measurements: Height: 6\' 1"  (185.4 cm) Weight: 68.4 kg (150 lb 12.7 oz) IBW/kg (Calculated) : 79.9 kg Heparin Dosing Weight: 77 kg  Vital Signs: Temp: 98.6 F (37 C) (05/14 1900) Temp Source: Oral (05/14 1900) BP: 116/72 (05/14 1900) Pulse Rate: 105 (05/14 1900)  Labs: Recent Labs    11/07/20 0413 11/08/20 0418 11/08/20 1617 11/09/20 0602 11/09/20 1300 11/09/20 2200  HGB 8.1* 7.6*  --  8.0*  --   --   HCT 24.2* 23.1*  --  24.0*  --   --   PLT 95* 89*  --  124*  --   --   LABPROT  --   --   --  13.8  --   --   INR  --   --   --  1.1  --   --   HEPARINUNFRC  --   --    < > 0.17* <0.10* 0.30  CREATININE 1.05 1.10  --  1.11  --   --    < > = values in this interval not displayed.    Estimated Creatinine Clearance: 60.8 mL/min (by C-G formula based on SCr of 1.11 mg/dL).   Medical History: Past Medical History:  Diagnosis Date  . Anemia   . Asthma    childhood  . Blood in stool   . Childhood asthma   . GI bleed   . Kidney laceration 1999  . Seizures [facility])    Assessment: 70 yo male presented with SOB and found to have mvCAD and an apical thrombus. PTA the patient is not on anticoagulation. 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 CT output down, CBC stable - pharmacy asked to start IV heparin and warfarin. Pt previously therapeutic on 1150 units/h of heparin - given recent OSH will target lower end of heparin goal and defer bolus and start drip rate lower.  HL went from undetectable this am to 0.3 - therapeutic this afternoon with small rate increase 900>1000 units/hr. No bleeding or infusion issues per discussion with nursing. Hgb down slightly to 8, pltc 124. INR down 1.1 today. To avoid increase too much over nite with decrease heparin drip slightly   Goal of Therapy:  Heparin  level 0.3-0.5 units/ml Monitor platelets by anticoagulation protocol: Yes   Plan:  Decrease  heparin infusion to 950 units/hr Monitor daily HL, INR, CBC, s/sx bleeding    78 Pharm.D. CPP, BCPS Clinical Pharmacist [phone] 11/09/2020 11:03 PM    Please check [email or web address] for unit-specific pharmacy phone numbers.

## 2020-11-09 NOTE — Progress Notes (Signed)
Patient ID: Troy Johnson, male   DOB: 1950-09-26, 70 y.o.   MRN: [id number]     Advanced Heart Failure Rounding Note  PCP-Cardiologist: None   Subjective:    CABG (5/10): LIMA-LAD, SVG-D2, SVG-OM1, SVG-PDA.   Off NE. DBA decreased yesterday 2.5 -> 1.5. Co-ox 65%-> 55%  Diuretics on hold. Weight down to baseline  CVP 2-3  SCr stable 1.11   Denies CP or SOB. IJ line bugging him    Cardiac MRI:  1. Severe LV enlargement with severe global hypokinesis with regional variation. EF 14%. 2.  Small apical LV thrombus. 3.  Normal RV size with mildly decreased systolic function, EF 36%. 4.  Moderate mitral regurgitation. 5. Based on severe LV dysfunction, there was less late gadolinium enhancement than I would have expected. With the exception of the apical septal wall and the true apex, would expect the remainder of the LV myocardium to be viable.   Objective:   Weight Range: 68.4 kg Body mass index is 19.89 kg/m.   Vital Signs:   Temp:  [97.5 F (36.4 C)-99 F (37.2 C)] 98.8 F (37.1 C) (05/14 0746) Pulse Rate:  [96-110] 104 (05/14 1000) Resp:  [9-27] 25 (05/14 1000) BP: (95-126)/(46-95) 126/95 (05/14 1000) SpO2:  [81 %-99 %] 92 % (05/14 1000) Weight:  [68.4 kg] 68.4 kg (05/14 0500) Last BM Date: 11/09/20 (Per patient.)  Weight change: Filed Weights   11/07/20 0500 11/08/20 0500 11/09/20 0500  Weight: 69.7 kg 69.9 kg 68.4 kg    Intake/Output:   Intake/Output Summary (Last 24 hours) at 11/09/2020 1047 Last data filed at 11/09/2020 1000 Gross per 24 hour  Intake 556.21 ml  Output 0 ml  Net 556.21 ml      Physical Exam   General:  Well appearing. No resp difficulty HEENT: normal Neck: supple. RIJ line  Carotids 2+ bilat; no bruits. No lymphadenopathy or thryomegaly appreciated. Cor: Sternal dressing ok . Regular rate & rhythm. No rubs, gallops or murmurs. Lungs: clear Abdomen: soft, nontender, nondistended. No hepatosplenomegaly. No bruits or masses. Good  bowel sounds. Extremities: no cyanosis, clubbing, rash, edema Neuro: alert & orientedx3, cranial nerves grossly intact. moves all 4 extremities w/o difficulty. Affect pleasant  Telemetry   NSR 90-100 Personally reviewed  Labs    CBC Recent Labs    11/08/20 0418 11/09/20 0602  WBC 6.5 6.2  HGB 7.6* 8.0*  HCT 23.1* 24.0*  MCV 99.6 98.4  PLT 89* 124*   Basic Metabolic Panel Recent Labs    72/09/47 1608 11/07/20 0413 11/08/20 0418 11/09/20 0602  NA 131*   < > 134* 135  K 4.3   < > 4.2 3.6  CL 101   < > 102 99  CO2 25   < > 27 26  GLUCOSE 126*   < > 95 105*  BUN 13   < > 14 14  CREATININE 1.01   < > 1.10 1.11  CALCIUM 8.8*   < > 8.8* 8.5*  MG 1.9  --   --   --    < > = values in this interval not displayed.   Liver Function Tests No results for input(s): AST, ALT, ALKPHOS, BILITOT, PROT, ALBUMIN in the last 72 hours. No results for input(s): LIPASE, AMYLASE in the last 72 hours. Cardiac Enzymes No results for input(s): CKTOTAL, CKMB, CKMBINDEX, TROPONINI in the last 72 hours.  BNP: BNP (last 3 results) Recent Labs    10/28/20 1433 11/02/20 0205  BNP 1,827.5* 1,261.6*  ProBNP (last 3 results) No results for input(s): PROBNP in the last 8760 hours.   D-Dimer No results for input(s): DDIMER in the last 72 hours. Hemoglobin A1C No results for input(s): HGBA1C in the last 72 hours. Fasting Lipid Panel No results for input(s): CHOL, HDL, LDLCALC, TRIG, CHOLHDL, LDLDIRECT in the last 72 hours. Thyroid Function Tests No results for input(s): TSH, T4TOTAL, T3FREE, THYROIDAB in the last 72 hours.  Invalid input(s): FREET3  Other results:   Imaging    DG Chest Port 1 View  Result Date: 11/09/2020 CLINICAL DATA:  Status post CABG EXAM: PORTABLE CHEST 1 VIEW COMPARISON:  11/07/2020 FINDINGS: Status post median sternotomy and CABG procedure. Right IJ Cordis tip projects over the SVC. There is been removal of the Swan-Ganz catheter. Trace bilateral pleural  effusions with veil like opacification over the lung bases. Subsegmental atelectasis identified within the left base. IMPRESSION: 1. No complication after removal of Swan-Ganz catheter, mediastinal drain and left chest tube. 2. Left base atelectasis. 3. Trace pleural effusions. Electronically Signed   By: Signa Kell M.D.   On: 11/09/2020 08:29     Medications:     Scheduled Medications: . acetaminophen  1,000 mg Oral Q6H   Or  . acetaminophen (TYLENOL) oral liquid 160 mg/5 mL  1,000 mg Per Tube Q6H  . aspirin EC  325 mg Oral Daily   Or  . aspirin  324 mg Per Tube Daily  . atorvastatin  80 mg Oral Daily  . bisacodyl  10 mg Oral Daily   Or  . bisacodyl  10 mg Rectal Daily  . Chlorhexidine Gluconate Cloth  6 each Topical Daily  . digoxin  0.125 mg Oral Daily  . docusate sodium  200 mg Oral Daily  . feeding supplement  237 mL Oral BID BM  . insulin aspart  0-24 Units Subcutaneous TID AC & HS  . mouth rinse  15 mL Mouth Rinse BID  . midodrine  10 mg Oral TID WC  . multivitamin with minerals  1 tablet Oral Daily  . pantoprazole  40 mg Oral Daily  . potassium chloride  20 mEq Oral Q4H  . sodium chloride flush  10-40 mL Intracatheter Q12H  . sodium chloride flush  3 mL Intravenous Q12H  . Warfarin - Pharmacist Dosing Inpatient   Does not apply q1600    Infusions: . DOBUTamine 1.5 mcg/kg/min (11/09/20 1000)  . heparin 900 Units/hr (11/09/20 1000)  . lactated ringers      PRN Medications: ipratropium-albuterol, lactated ringers, metoprolol tartrate, morphine injection, ondansetron (ZOFRAN) IV, oxyCODONE, traMADol   Assessment/Plan   1. CAD: No chest pain, presented with dyspnea.  NSTEMI with HS-TnI elevated to peak 746.  Cath showed severe multivessel CAD with reasonable targets for CABG.  However, EF < 20%.  Cardiac MRI suggested extensive viability (except for apical septal wall and true apex).  Now s/p CABG x 4 on 5/10.  - ASA + atorvastatin 80 mg daily.  - stable 2.  Acute systolic CHF: Ischemic cardiomyopathy.  Echo this admission with EF <20%, the septum and apex were akinetic, RV function looked low normal, moderate MR.  Cardiac MRI showed EF 14% with scar in the apical septal wall and apex, mildly decreased RV systolic function. Suspect significant viability as above.  Now s/p CABG.  Now off NE. On Dobutamine 1.5. Co-ox 55%. CVP remains low 2-3 and back to pre-op wt - With low CVP and weight at baseline, continue to hold diuretics today, probably  start po Lasix tomorrow.  - Continue digoxin 0.125 daily.  - Will stop DBA today and follow co-ox  - Wean midodrine to 5 bid - Add GDMT after weaning of pressor/inotrope.  - Would hope for improvement in function with revascularization.  3. LV thrombus: Small, noted on echo and cMRI.  - on heparin/warfarin per TCTS INR 1.1 4. Mitral regurgitation: Likely functional.  Moderate.  5. Hyperlipidemia: Very high LDL.   - Atorvastatin 80 daily.  - May need Repatha in future.  6. Thrombocytopenia: Post-op, improving/ PLTs 124k  Ok for floor.   Length of Stay: 12  Arvilla Meres, MD  11/09/2020, 10:47 AM  Advanced Heart Failure Team Pager [phone] (M-F; 7a - 5p)  Please contact [facility] for night-coverage after hours (5p -7a ) and weekends on [email or web address]

## 2020-11-09 NOTE — Progress Notes (Signed)
4 Days Post-Op Procedure(s) (LRB): CORONARY ARTERY BYPASS GRAFTING (CABG) (N/A) TRANSESOPHAGEAL ECHOCARDIOGRAM (TEE) (N/A) Subjective: No complaints  Objective: Vital signs in last 24 hours: Temp:  [97.5 F (36.4 C)-99 F (37.2 C)] 98.8 F (37.1 C) (05/14 0746) Pulse Rate:  [96-110] 102 (05/14 0800) Cardiac Rhythm: Normal sinus rhythm;Sinus tachycardia (05/14 0800) Resp:  [9-27] 12 (05/14 0800) BP: (95-126)/(46-81) 99/54 (05/14 0800) SpO2:  [81 %-99 %] 99 % (05/14 0800) Weight:  [68.4 kg] 68.4 kg (05/14 0500)  Hemodynamic parameters for last 24 hours: CVP:  [0 mmHg-5 mmHg] 5 mmHg  Intake/Output from previous day: 05/13 0701 - 05/14 0700 In: 558.6 [P.O.:300; I.V.:258.6] Out: 40 [Chest Tube:40] Intake/Output this shift: Total I/O In: 150.5 [P.O.:120; I.V.:30.5] Out: -   General appearance: alert and cooperative Neurologic: intact Heart: regular rate and rhythm, S1, S2 normal, no murmur, click, rub or gallop Lungs: clear to auscultation bilaterally Abdomen: soft, non-tender; bowel sounds normal; no masses,  no organomegaly Extremities: extremities normal, atraumatic, no cyanosis or edema Wound: c/d/i  Lab Results: Recent Labs    11/08/20 0418 11/09/20 0602  WBC 6.5 6.2  HGB 7.6* 8.0*  HCT 23.1* 24.0*  PLT 89* 124*   BMET:  Recent Labs    11/08/20 0418 11/09/20 0602  NA 134* 135  K 4.2 3.6  CL 102 99  CO2 27 26  GLUCOSE 95 105*  BUN 14 14  CREATININE 1.10 1.11  CALCIUM 8.8* 8.5*    PT/INR:  Recent Labs    11/09/20 0602  LABPROT 13.8  INR 1.1   ABG    Component Value Date/Time   PHART 7.325 (L) 11/05/2020 1855   HCO3 17.2 (L) 11/05/2020 1855   TCO2 18 (L) 11/05/2020 1855   ACIDBASEDEF 8.0 (H) 11/05/2020 1855   O2SAT 55.2 11/09/2020 0602   CBG (last 3)  Recent Labs    11/08/20 2326 11/09/20 0608 11/09/20 0747  GLUCAP 92 95 138*    Assessment/Plan: S/P Procedure(s) (LRB): CORONARY ARTERY BYPASS GRAFTING (CABG) (N/A) TRANSESOPHAGEAL  ECHOCARDIOGRAM (TEE) (N/A) Mobilize Diuresis Plan for transfer to step-down: see transfer orders   LOS: 12 days    Linden Dolin 11/09/2020

## 2020-11-09 NOTE — Plan of Care (Signed)
  Problem: Education: Goal: Knowledge of General Education information will improve Description: Including pain rating scale, medication(s)/side effects and non-pharmacologic comfort measures Outcome: Progressing   Problem: Health Behavior/Discharge Planning: Goal: Ability to manage health-related needs will improve Outcome: Progressing   Problem: Clinical Measurements: Goal: Ability to maintain clinical measurements within normal limits will improve Outcome: Progressing Goal: Will remain free from infection Outcome: Progressing Goal: Diagnostic test results will improve Outcome: Progressing Goal: Respiratory complications will improve Outcome: Progressing Goal: Cardiovascular complication will be avoided Outcome: Progressing   Problem: Activity: Goal: Risk for activity intolerance will decrease Outcome: Progressing   Problem: Nutrition: Goal: Adequate nutrition will be maintained Outcome: Progressing   Problem: Coping: Goal: Level of anxiety will decrease Outcome: Progressing   Problem: Elimination: Goal: Will not experience complications related to bowel motility Outcome: Progressing Goal: Will not experience complications related to urinary retention Outcome: Progressing   Problem: Pain Managment: Goal: General experience of comfort will improve Outcome: Progressing   Problem: Safety: Goal: Ability to remain free from injury will improve Outcome: Progressing   Problem: Skin Integrity: Goal: Risk for impaired skin integrity will decrease Outcome: Progressing   Problem: Education: Goal: Ability to demonstrate management of disease process will improve Outcome: Progressing Goal: Ability to verbalize understanding of medication therapies will improve Outcome: Progressing Goal: Individualized Educational Video(s) Outcome: Progressing   Problem: Activity: Goal: Capacity to carry out activities will improve Outcome: Progressing   Problem: Cardiac: Goal:  Ability to achieve and maintain adequate cardiopulmonary perfusion will improve Outcome: Progressing   Problem: Education: Goal: Will demonstrate proper wound care and an understanding of methods to prevent future damage Outcome: Progressing Goal: Knowledge of disease or condition will improve Outcome: Progressing Goal: Knowledge of the prescribed therapeutic regimen will improve Outcome: Progressing Goal: Individualized Educational Video(s) Outcome: Progressing   Problem: Activity: Goal: Risk for activity intolerance will decrease Outcome: Progressing   Problem: Cardiac: Goal: Will achieve and/or maintain hemodynamic stability Outcome: Progressing   Problem: Clinical Measurements: Goal: Postoperative complications will be avoided or minimized Outcome: Progressing   Problem: Respiratory: Goal: Respiratory status will improve Outcome: Progressing   Problem: Skin Integrity: Goal: Wound healing without signs and symptoms of infection Outcome: Progressing Goal: Risk for impaired skin integrity will decrease Outcome: Progressing   Problem: Urinary Elimination: Goal: Ability to achieve and maintain adequate renal perfusion and functioning will improve Outcome: Progressing   

## 2020-11-09 NOTE — Progress Notes (Signed)
ANTICOAGULATION CONSULT NOTE - Follow Up Consult  Pharmacy Consult for Heparin Indication: LV apical thrombus  Allergies  Allergen Reactions  . Penicillins Other (See Comments)    childhood    Patient Measurements: Height: 6\' 1"  (185.4 cm) Weight: 68.4 kg (150 lb 12.7 oz) IBW/kg (Calculated) : 79.9 kg Heparin Dosing Weight: 77 kg  Vital Signs: Temp: 98.3 F (36.8 C) (05/14 1127) Temp Source: Oral (05/14 1127) BP: 127/77 (05/14 1411) Pulse Rate: 95 (05/14 1411)  Labs: Recent Labs    11/07/20 0413 11/08/20 0418 11/08/20 1617 11/09/20 0602 11/09/20 1300  HGB 8.1* 7.6*  --  8.0*  --   HCT 24.2* 23.1*  --  24.0*  --   PLT 95* 89*  --  124*  --   LABPROT  --   --   --  13.8  --   INR  --   --   --  1.1  --   HEPARINUNFRC  --   --  <0.10* 0.17* <0.10*  CREATININE 1.05 1.10  --  1.11  --     Estimated Creatinine Clearance: 60.8 mL/min (by C-G formula based on SCr of 1.11 mg/dL).   Medical History: Past Medical History:  Diagnosis Date  . Anemia   . Asthma    childhood  . Blood in stool   . Childhood asthma   . GI bleed   . Kidney laceration 1999  . 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 PTA the patient is not on anticoagulation. 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 CT output down, CBC stable - pharmacy asked to start IV heparin and warfarin. Pt previously therapeutic on 1150 units/h of heparin - given recent OSH will target lower end of heparin goal and defer bolus and start drip rate lower.  HL undetectable again on 900 units/hr. No bleeding or infusion issues per discussion with nursing. Hgb down slightly to 8, pltc 124. INR down 1.1 today.   Goal of Therapy:  Heparin level 0.3-0.5 units/ml Monitor platelets by anticoagulation protocol: Yes   Plan:  Warfarin 5 mg PO x1 again today Increase heparin infusion to 1000 units/hr Monitor daily HL, INR, CBC, s/sx bleeding  78, PharmD, BCPS PGY2  Cardiology Pharmacy Resident Phone: [phone] 11/09/2020  3:15 PM  Please check [email or web address] for unit-specific pharmacy phone numbers.

## 2020-11-09 NOTE — Progress Notes (Signed)
ANTICOAGULATION CONSULT NOTE - Follow Up Consult  Pharmacy Consult for heparin Indication: LV apical thrombus  Labs: Recent Labs    11/06/20 1608 11/07/20 0413 11/08/20 0418 11/08/20 1617 11/09/20 0602  HGB 8.4* 8.1* 7.6*  --   --   HCT 25.3* 24.2* 23.1*  --   --   PLT 112* 95* 89*  --   --   LABPROT  --   --   --   --  13.8  INR  --   --   --   --  1.1  HEPARINUNFRC  --   --   --  <0.10* 0.17*  CREATININE 1.01 1.05 1.10  --   --     Assessment: 70yo male remains subtherapeutic on heparin after rate change; no gtt issues or signs of bleeding per RN.  Goal of Therapy:  Heparin level ~0.3 units/ml   Plan:  Will increase heparin gtt conservatively to 900 units/hr and check level in 6 hours.    Vernard Gambles, PharmD, BCPS  11/09/2020,6:47 AM

## 2020-11-10 DIAGNOSIS — I5021 Acute systolic (congestive) heart failure: Secondary | ICD-10-CM | POA: Diagnosis not present

## 2020-11-10 LAB — BASIC METABOLIC PANEL
Anion gap: 7 (ref 5–15)
BUN: 10 mg/dL (ref 8–23)
CO2: 25 mmol/L (ref 22–32)
Calcium: 8.6 mg/dL — ABNORMAL LOW (ref 8.9–10.3)
Chloride: 104 mmol/L (ref 98–111)
Creatinine, Ser: 1.08 mg/dL (ref 0.61–1.24)
GFR, Estimated: >60 mL/min (ref >60)
Glucose, Bld: 94 mg/dL (ref 70–99)
Potassium: 3.9 mmol/L (ref 3.5–5.1)
Sodium: 136 mmol/L (ref 135–145)

## 2020-11-10 LAB — CBC
HCT: 24.1 % — ABNORMAL LOW (ref 39.0–52.0)
Hemoglobin: 8.1 g/dL — ABNORMAL LOW (ref 13.0–17.0)
MCH: 32.8 pg (ref 26.0–34.0)
MCHC: 33.6 g/dL (ref 30.0–36.0)
MCV: 97.6 fL (ref 80.0–100.0)
Platelets: 144 10*3/uL — ABNORMAL LOW (ref 150–400)
RBC: 2.47 MIL/uL — ABNORMAL LOW (ref 4.22–5.81)
RDW: 15 % (ref 11.5–15.5)
WBC: 6.4 10*3/uL (ref 4.0–10.5)
nRBC: 0 % (ref 0.0–0.2)

## 2020-11-10 LAB — COOXEMETRY PANEL
Carboxyhemoglobin: 1.4 % (ref 0.5–1.5)
Methemoglobin: 1 % (ref 0.0–1.5)
O2 Saturation: 70.2 %
Total hemoglobin: 8.2 g/dL — ABNORMAL LOW (ref 12.0–16.0)

## 2020-11-10 LAB — PROTIME-INR
INR: 1 (ref 0.8–1.2)
Prothrombin Time: 13.6 seconds (ref 11.4–15.2)

## 2020-11-10 LAB — GLUCOSE, CAPILLARY
Glucose-Capillary: 102 mg/dL — ABNORMAL HIGH (ref 70–99)
Glucose-Capillary: 125 mg/dL — ABNORMAL HIGH (ref 70–99)
Glucose-Capillary: 102 mg/dL — ABNORMAL HIGH (ref 70–99)
Glucose-Capillary: 102 mg/dL — ABNORMAL HIGH (ref 70–99)

## 2020-11-10 LAB — HEPARIN LEVEL (UNFRACTIONATED)
Heparin Unfractionated: 0.58 IU/mL (ref 0.30–0.70)
Heparin Unfractionated: <0.1 IU/mL — ABNORMAL LOW (ref 0.30–0.70)
Heparin Unfractionated: 0.19 IU/mL — ABNORMAL LOW (ref 0.30–0.70)

## 2020-11-10 MED ORDER — POTASSIUM CHLORIDE CRYS ER 20 MEQ PO TBCR
40.0000 meq | EXTENDED_RELEASE_TABLET | Freq: Once | ORAL | Status: AC
  Administered 2020-11-10: 40 meq via ORAL
  Filled 2020-11-10: qty 2

## 2020-11-10 MED ORDER — DAPAGLIFLOZIN PROPANEDIOL 10 MG PO TABS
10.0000 mg | ORAL_TABLET | Freq: Every day | ORAL | Status: DC
  Administered 2020-11-10 – 2020-11-14 (×5): 10 mg via ORAL
  Filled 2020-11-10 (×5): qty 1

## 2020-11-10 MED ORDER — MIDODRINE HCL 5 MG PO TABS
2.5000 mg | ORAL_TABLET | Freq: Three times a day (TID) | ORAL | Status: DC
  Administered 2020-11-10: 2.5 mg via ORAL
  Filled 2020-11-10: qty 1

## 2020-11-10 MED ORDER — WARFARIN SODIUM 5 MG PO TABS
5.0000 mg | ORAL_TABLET | Freq: Once | ORAL | Status: AC
  Administered 2020-11-10: 5 mg via ORAL
  Filled 2020-11-10: qty 1

## 2020-11-10 NOTE — Progress Notes (Signed)
ANTICOAGULATION CONSULT NOTE - Follow Up Consult  Pharmacy Consult for Heparin Indication: LV apical thrombus  Allergies  Allergen Reactions  . Penicillins Other (See Comments)    childhood    Patient Measurements: Height: 6\' 1"  (185.4 cm) Weight: 67.7 kg (149 lb 4 oz) IBW/kg (Calculated) : 79.9 kg Heparin Dosing Weight: 77 kg  Vital Signs: Temp: 98 F (36.7 C) (05/15 0716) Temp Source: Oral (05/15 0716) BP: 120/84 (05/15 0716) Pulse Rate: 101 (05/15 0716)  Labs: Recent Labs    11/08/20 0418 11/08/20 1617 11/09/20 0602 11/09/20 1300 11/09/20 2200 11/10/20 0500  HGB 7.6*  --  8.0*  --   --  8.1*  HCT 23.1*  --  24.0*  --   --  24.1*  PLT 89*  --  124*  --   --  144*  LABPROT  --   --  13.8  --   --  13.6  INR  --   --  1.1  --   --  1.0  HEPARINUNFRC  --    < > 0.17* <0.10* 0.30 0.58  CREATININE 1.10  --  1.11  --   --  1.08   < > = values in this interval not displayed.    Estimated Creatinine Clearance: 61.8 mL/min (by C-G formula based on SCr of 1.08 mg/dL).   Medical History: Past Medical History:  Diagnosis Date  . Anemia   . Asthma    childhood  . Blood in stool   . Childhood asthma   . GI bleed   . Kidney laceration 1999  . 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 PTA the patient is not on anticoagulation. 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 CT output down, CBC stable - pharmacy asked to start IV heparin and warfarin. Pt previously therapeutic on 1150 units/h of heparin - given recent OSH will target lower end of heparin goal and defer bolus and start drip rate lower.  HL now supratherapeutic after decreasing infusion rate to 950 units/hr. INR 1.0. CBC stable with Hgb 8.1, pltc 144. No bleeding or infusion issues per discussion with nursing.  Goal of Therapy:  Heparin level 0.3-0.5 units/ml Monitor platelets by anticoagulation protocol: Yes   Plan:  Warfarin 5 mg x1 tonight Decrease heparin  infusion to 850 units/hr Check 6hr HL Monitor daily HL, INR, CBC, s/sx bleeding  78, PharmD, BCPS PGY2 Cardiology Pharmacy Resident Phone: [phone] 11/10/2020  7:34 AM  Please check [email or web address] for unit-specific pharmacy phone numbers.

## 2020-11-10 NOTE — Progress Notes (Signed)
Patient ID: Troy Johnson, male   DOB: [birth year], 70 y.o.   MRN: [id number]     Advanced Heart Failure Rounding Note  PCP-Cardiologist: None   Subjective:    CABG (5/10): LIMA-LAD, SVG-D2, SVG-OM1, SVG-PDA.   DBA stopped yesterday. CO-ox 70% CVP 4-5   Denies SOB, orthopnea or PND. Ambulating halls.    Cardiac MRI:  1. Severe LV enlargement with severe global hypokinesis with regional variation. EF 14%. 2.  Small apical LV thrombus. 3.  Normal RV size with mildly decreased systolic function, EF 36%. 4.  Moderate mitral regurgitation. 5. Based on severe LV dysfunction, there was less late gadolinium enhancement than I would have expected. With the exception of the apical septal wall and the true apex, would expect the remainder of the LV myocardium to be viable.   Objective:   Weight Range: 67.7 kg Body mass index is 19.69 kg/m.   Vital Signs:   Temp:  [98 F (36.7 C)-98.6 F (37 C)] 98.6 F (37 C) (05/15 1140) Pulse Rate:  [95-105] 101 (05/15 0716) Resp:  [13-22] 16 (05/15 0716) BP: (103-133)/(59-89) 103/89 (05/15 1140) SpO2:  [92 %-98 %] 98 % (05/15 0716) Weight:  [67.7 kg] 67.7 kg (05/15 0500) Last BM Date: 11/09/20  Weight change: Filed Weights   11/09/20 0500 11/10/20 0300 11/10/20 0500  Weight: 68.4 kg 67.7 kg 67.7 kg    Intake/Output:   Intake/Output Summary (Last 24 hours) at 11/10/2020 1223 Last data filed at 11/10/2020 0750 Gross per 24 hour  Intake 384.66 ml  Output 1500 ml  Net -1115.34 ml      Physical Exam   General:  Well appearing. No resp difficulty HEENT: normal Neck: supple. no JVD. Carotids 2+ bilat; no bruits. No lymphadenopathy or thryomegaly appreciated. Cor: Sternal wound ok . Regular rate & rhythm. No rubs, gallops or murmurs. Lungs: clear Abdomen: soft, nontender, nondistended. No hepatosplenomegaly. No bruits or masses. Good bowel sounds. Extremities: no cyanosis, clubbing, rash, edema Neuro: alert & orientedx3, cranial  nerves grossly intact. moves all 4 extremities w/o difficulty. Affect pleasant   Telemetry   NSR 90-100 Personally reviewed  Labs    CBC Recent Labs    11/09/20 0602 11/10/20 0500  WBC 6.2 6.4  HGB 8.0* 8.1*  HCT 24.0* 24.1*  MCV 98.4 97.6  PLT 124* 144*   Basic Metabolic Panel Recent Labs    40/81/44 0602 11/10/20 0500  NA 135 136  K 3.6 3.9  CL 99 104  CO2 26 25  GLUCOSE 105* 94  BUN 14 10  CREATININE 1.11 1.08  CALCIUM 8.5* 8.6*   Liver Function Tests No results for input(s): AST, ALT, ALKPHOS, BILITOT, PROT, ALBUMIN in the last 72 hours. No results for input(s): LIPASE, AMYLASE in the last 72 hours. Cardiac Enzymes No results for input(s): CKTOTAL, CKMB, CKMBINDEX, TROPONINI in the last 72 hours.  BNP: BNP (last 3 results) Recent Labs    10/28/20 1433 11/02/20 0205  BNP 1,827.5* 1,261.6*    ProBNP (last 3 results) No results for input(s): PROBNP in the last 8760 hours.   D-Dimer No results for input(s): DDIMER in the last 72 hours. Hemoglobin A1C No results for input(s): HGBA1C in the last 72 hours. Fasting Lipid Panel No results for input(s): CHOL, HDL, LDLCALC, TRIG, CHOLHDL, LDLDIRECT in the last 72 hours. Thyroid Function Tests No results for input(s): TSH, T4TOTAL, T3FREE, THYROIDAB in the last 72 hours.  Invalid input(s): FREET3  Other results:   Imaging    No  results found.   Medications:     Scheduled Medications: . acetaminophen  1,000 mg Oral Q6H   Or  . acetaminophen (TYLENOL) oral liquid 160 mg/5 mL  1,000 mg Per Tube Q6H  . aspirin EC  325 mg Oral Daily   Or  . aspirin  324 mg Per Tube Daily  . atorvastatin  80 mg Oral Daily  . bisacodyl  10 mg Oral Daily   Or  . bisacodyl  10 mg Rectal Daily  . Chlorhexidine Gluconate Cloth  6 each Topical Daily  . digoxin  0.125 mg Oral Daily  . docusate sodium  200 mg Oral Daily  . feeding supplement  237 mL Oral BID BM  . insulin aspart  0-24 Units Subcutaneous TID AC & HS   . mouth rinse  15 mL Mouth Rinse BID  . midodrine  5 mg Oral TID WC  . multivitamin with minerals  1 tablet Oral Daily  . pantoprazole  40 mg Oral Daily  . sodium chloride flush  10-40 mL Intracatheter Q12H  . sodium chloride flush  3 mL Intravenous Q12H  . warfarin  5 mg Oral ONCE-1600  . Warfarin - Pharmacist Dosing Inpatient   Does not apply q1600    Infusions: . heparin 850 Units/hr (11/10/20 0734)  . lactated ringers      PRN Medications: ipratropium-albuterol, lactated ringers, metoprolol tartrate, ondansetron (ZOFRAN) IV, oxyCODONE, traMADol   Assessment/Plan   1. CAD: No chest pain, presented with dyspnea.  NSTEMI with HS-TnI elevated to peak 746.  Cath showed severe multivessel CAD with reasonable targets for CABG.  However, EF < 20%.  Cardiac MRI suggested extensive viability (except for apical septal wall and true apex).  Now s/p CABG x 4 on 5/10.  - ASA + atorvastatin 80 mg daily.  - stable. No s/s angina  2. Acute systolic CHF: Ischemic cardiomyopathy.  Echo this admission with EF <20%, the septum and apex were akinetic, RV function looked low normal, moderate MR.  Cardiac MRI showed EF 14% with scar in the apical septal wall and apex, mildly decreased RV systolic function. Suspect significant viability as above.  Now s/p CABG.  Now off NE. Off DBA. Co-ox 70% CVP 4-5 - Continue digoxin 0.125 daily.  - Wean midodrine to 2.5 bid - Add GDMT after weaning of pressor/inotrope.  - Will add [place] today - Would hope for improvement in function with revascularization.  3. LV thrombus: Small, noted on echo and cMRI.  - on heparin/warfarin per TCTS INR 1.0 4. Mitral regurgitation: Likely functional.  Moderate.  5. Hyperlipidemia: Very high LDL.   - Atorvastatin 80 daily.  - May need Repatha in future.  6. Thrombocytopenia: Post-op, improving/ PLTs 124k    Length of Stay: 13  Arvilla Meres, MD  11/10/2020, 12:23 PM  Advanced Heart Failure Team Pager [phone] (M-F;  7a - 5p)  Please contact [facility] for night-coverage after hours (5p -7a ) and weekends on [email or web address]

## 2020-11-10 NOTE — Progress Notes (Signed)
ANTICOAGULATION CONSULT NOTE - Follow Up Consult  Pharmacy Consult for Heparin Indication: LV apical thrombus   Labs: Recent Labs    11/08/20 0418 11/08/20 1617 11/09/20 0602 11/09/20 1300 11/10/20 0500 11/10/20 1442 11/10/20 2231  HGB 7.6*  --  8.0*  --  8.1*  --   --   HCT 23.1*  --  24.0*  --  24.1*  --   --   PLT 89*  --  124*  --  144*  --   --   LABPROT  --   --  13.8  --  13.6  --   --   INR  --   --  1.1  --  1.0  --   --   HEPARINUNFRC  --    < > 0.17*   < > 0.58 <0.10* 0.19*  CREATININE 1.10  --  1.11  --  1.08  --   --    < > = values in this interval not displayed.    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 PTA the patient is not on anticoagulation. 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 CT output down, CBC stable - pharmacy asked to start IV heparin and warfarin. Pt previously therapeutic on 1150 units/h of heparin - given recent OSH will target lower end of heparin goal and defer bolus and start drip rate lower.  HL is 0.19 units/ml.  Rate was increased earlier to 900 units/hr and coumadin has been resumed  Goal of Therapy:  Heparin level 0.3-0.5 units/ml Monitor platelets by anticoagulation protocol: Yes   Plan:  Continue heparin infusion at 900 units/hr  Daily heparin level and CBC Monitor closely for s/s of bleeding   Thanks for allowing pharmacy to be a part of this patient's care.  Talbert Cage, PharmD Clinical Pharmacist

## 2020-11-10 NOTE — Progress Notes (Signed)
Mobility Specialist - Progress Note   11/10/20 1058  Mobility  Activity Refused mobility   Pt states he just finished washing and wants to rest as he has been ambulating independently. Will f/u as able.   Mamie Levers Mobility Specialist Mobility Specialist Phone: [phone]

## 2020-11-10 NOTE — Progress Notes (Signed)
ANTICOAGULATION CONSULT NOTE - Follow Up Consult  Pharmacy Consult for Heparin Indication: LV apical thrombus  Allergies  Allergen Reactions  . Penicillins Other (See Comments)    childhood    Patient Measurements: Height: 6\' 1"  (185.4 cm) Weight: 67.7 kg (149 lb 4 oz) IBW/kg (Calculated) : 79.9 kg Heparin Dosing Weight: 77 kg  Vital Signs: Temp: 98.6 F (37 C) (05/15 1140) Temp Source: Oral (05/15 1140) BP: 103/89 (05/15 1140) Pulse Rate: 102 (05/15 1140)  Labs: Recent Labs    11/08/20 0418 11/08/20 1617 11/09/20 0602 11/09/20 1300 11/09/20 2200 11/10/20 0500 11/10/20 1442  HGB 7.6*  --  8.0*  --   --  8.1*  --   HCT 23.1*  --  24.0*  --   --  24.1*  --   PLT 89*  --  124*  --   --  144*  --   LABPROT  --   --  13.8  --   --  13.6  --   INR  --   --  1.1  --   --  1.0  --   HEPARINUNFRC  --    < > 0.17*   < > 0.30 0.58 <0.10*  CREATININE 1.10  --  1.11  --   --  1.08  --    < > = values in this interval not displayed.    Estimated Creatinine Clearance: 61.8 mL/min (by C-G formula based on SCr of 1.08 mg/dL).   Medical History: Past Medical History:  Diagnosis Date  . Anemia   . Asthma    childhood  . Blood in stool   . Childhood asthma   . GI bleed   . Kidney laceration 1999  . 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 PTA the patient is not on anticoagulation. 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 CT output down, CBC stable - pharmacy asked to start IV heparin and warfarin. Pt previously therapeutic on 1150 units/h of heparin - given recent OSH will target lower end of heparin goal and defer bolus and start drip rate lower.  HL is now undetectable on 850 units/hr. Per RN, no issues with infusion. No overt s/s of bleeding noted.   Goal of Therapy:  Heparin level 0.3-0.5 units/ml Monitor platelets by anticoagulation protocol: Yes   Plan:  Increase heparin infusion to 900 units/hr  F/u 6 hr  HL Monitor closely for s/s of bleeding   78, PharmD., BCPS, BCCCP Clinical Pharmacist Please refer to [facility] for unit-specific pharmacist    Please check [email or web address] for unit-specific pharmacy phone numbers.

## 2020-11-10 NOTE — Progress Notes (Signed)
5 Days Post-Op Procedure(s) (LRB): CORONARY ARTERY BYPASS GRAFTING (CABG) (N/A) TRANSESOPHAGEAL ECHOCARDIOGRAM (TEE) (N/A) Subjective: No complaints  Objective: Vital signs in last 24 hours: Temp:  [98 F (36.7 C)-98.6 F (37 C)] 98 F (36.7 C) (05/15 0716) Pulse Rate:  [95-105] 101 (05/15 0716) Cardiac Rhythm: Normal sinus rhythm (05/15 0710) Resp:  [13-22] 16 (05/15 0716) BP: (116-133)/(59-84) 120/84 (05/15 0716) SpO2:  [92 %-98 %] 98 % (05/15 0716) Weight:  [67.7 kg] 67.7 kg (05/15 0500)  Hemodynamic parameters for last 24 hours: CVP:  [3 mmHg-5 mmHg] 5 mmHg  Intake/Output from previous day: 05/14 0701 - 05/15 0700 In: 346.7 [P.O.:140; I.V.:206.7] Out: 1500 [Urine:1500] Intake/Output this shift: Total I/O In: 240 [P.O.:240] Out: 0   General appearance: alert and cooperative Neurologic: intact Heart: regular rate and rhythm, S1, S2 normal, no murmur, click, rub or gallop Wound: c/d/i  Lab Results: Recent Labs    11/09/20 0602 11/10/20 0500  WBC 6.2 6.4  HGB 8.0* 8.1*  HCT 24.0* 24.1*  PLT 124* 144*   BMET:  Recent Labs    11/09/20 0602 11/10/20 0500  NA 135 136  K 3.6 3.9  CL 99 104  CO2 26 25  GLUCOSE 105* 94  BUN 14 10  CREATININE 1.11 1.08  CALCIUM 8.5* 8.6*    PT/INR:  Recent Labs    11/10/20 0500  LABPROT 13.6  INR 1.0   ABG    Component Value Date/Time   PHART 7.325 (L) 11/05/2020 1855   HCO3 17.2 (L) 11/05/2020 1855   TCO2 18 (L) 11/05/2020 1855   ACIDBASEDEF 8.0 (H) 11/05/2020 1855   O2SAT 70.2 11/10/2020 0600   CBG (last 3)  Recent Labs    11/09/20 1553 11/09/20 2108 11/10/20 0628  GLUCAP 92 98 102*    Assessment/Plan: S/P Procedure(s) (LRB): CORONARY ARTERY BYPASS GRAFTING (CABG) (N/A) TRANSESOPHAGEAL ECHOCARDIOGRAM (TEE) (N/A) Mobilize Diuresis d/c central line  Anticipate discharge soon   LOS: 13 days    Troy Johnson 11/10/2020

## 2020-11-11 DIAGNOSIS — Z951 Presence of aortocoronary bypass graft: Secondary | ICD-10-CM

## 2020-11-11 DIAGNOSIS — I5021 Acute systolic (congestive) heart failure: Secondary | ICD-10-CM | POA: Diagnosis not present

## 2020-11-11 LAB — BASIC METABOLIC PANEL
Anion gap: 12 (ref 5–15)
BUN: 12 mg/dL (ref 8–23)
CO2: 21 mmol/L — ABNORMAL LOW (ref 22–32)
Calcium: 9.2 mg/dL (ref 8.9–10.3)
Chloride: 103 mmol/L (ref 98–111)
Creatinine, Ser: 1.23 mg/dL (ref 0.61–1.24)
GFR, Estimated: >60 mL/min (ref >60)
Glucose, Bld: 140 mg/dL — ABNORMAL HIGH (ref 70–99)
Potassium: 4.2 mmol/L (ref 3.5–5.1)
Sodium: 136 mmol/L (ref 135–145)

## 2020-11-11 LAB — HEPARIN LEVEL (UNFRACTIONATED): Heparin Unfractionated: 0.14 IU/mL — ABNORMAL LOW (ref 0.30–0.70)

## 2020-11-11 LAB — GLUCOSE, CAPILLARY
Glucose-Capillary: 81 mg/dL (ref 70–99)
Glucose-Capillary: 89 mg/dL (ref 70–99)
Glucose-Capillary: 92 mg/dL (ref 70–99)

## 2020-11-11 LAB — CBC
HCT: 27.1 % — ABNORMAL LOW (ref 39.0–52.0)
Hemoglobin: 9.2 g/dL — ABNORMAL LOW (ref 13.0–17.0)
MCH: 32.5 pg (ref 26.0–34.0)
MCHC: 33.9 g/dL (ref 30.0–36.0)
MCV: 95.8 fL (ref 80.0–100.0)
Platelets: 214 10*3/uL (ref 150–400)
RBC: 2.83 MIL/uL — ABNORMAL LOW (ref 4.22–5.81)
RDW: 14.6 % (ref 11.5–15.5)
WBC: 6.4 10*3/uL (ref 4.0–10.5)
nRBC: 0 % (ref 0.0–0.2)

## 2020-11-11 LAB — MAGNESIUM: Magnesium: 1.8 mg/dL (ref 1.7–2.4)

## 2020-11-11 LAB — PROTIME-INR
INR: 1.1 (ref 0.8–1.2)
Prothrombin Time: 13.7 seconds (ref 11.4–15.2)

## 2020-11-11 LAB — DIGOXIN LEVEL: Digoxin Level: 0.6 ng/mL — ABNORMAL LOW (ref 0.8–2.0)

## 2020-11-11 MED ORDER — SPIRONOLACTONE 12.5 MG HALF TABLET
12.5000 mg | ORAL_TABLET | Freq: Every day | ORAL | Status: DC
  Administered 2020-11-11: 12.5 mg via ORAL
  Filled 2020-11-11: qty 1

## 2020-11-11 MED ORDER — MAGNESIUM SULFATE 2 GM/50ML IV SOLN
2.0000 g | Freq: Once | INTRAVENOUS | Status: AC
  Administered 2020-11-11: 2 g via INTRAVENOUS
  Filled 2020-11-11: qty 50

## 2020-11-11 MED ORDER — WARFARIN SODIUM 7.5 MG PO TABS
7.5000 mg | ORAL_TABLET | Freq: Once | ORAL | Status: AC
  Administered 2020-11-11: 7.5 mg via ORAL
  Filled 2020-11-11: qty 1

## 2020-11-11 NOTE — Progress Notes (Addendum)
Patient ID: Troy Johnson, male   DOB: Oct 23, 1950, 70 y.o.   MRN: [id number]     Advanced Heart Failure Rounding Note  PCP-Cardiologist: None   Subjective:    CABG (5/10): LIMA-LAD, SVG-D2, SVG-OM1, SVG-PDA.   11/10/20 Farxiga added. Midodrine was cut back 2.5 mg tid.   Complains of chest soreness.    Objective:   Weight Range: 65.9 kg Body mass index is 19.17 kg/m.   Vital Signs:   Temp:  [98 F (36.7 C)-99.5 F (37.5 C)] 98.9 F (37.2 C) (05/16 0439) Pulse Rate:  [93-102] 99 (05/16 0439) Resp:  [14-20] 17 (05/16 0439) BP: (103-123)/(69-90) 122/80 (05/16 0439) SpO2:  [96 %-100 %] 98 % (05/16 0439) Weight:  [65.9 kg] 65.9 kg (05/16 0439) Last BM Date: 11/09/20  Weight change: Filed Weights   11/10/20 0300 11/10/20 0500 11/11/20 0439  Weight: 67.7 kg 67.7 kg 65.9 kg    Intake/Output:   Intake/Output Summary (Last 24 hours) at 11/11/2020 0714 Last data filed at 11/11/2020 0359 Gross per 24 hour  Intake 1231.7 ml  Output 540 ml  Net 691.7 ml      Physical Exam  General:  Sitting in the chair.  No resp difficulty HEENT: normal Neck: supple. no JVD. Carotids 2+ bilat; no bruits. No lymphadenopathy or thryomegaly appreciated. Cor: PMI nondisplaced. Regular rate & rhythm. No rubs, gallops or murmurs.Sternal incision approximated.  Lungs: clear Abdomen: soft, nontender, nondistended. No hepatosplenomegaly. No bruits or masses. Good bowel sounds. Extremities: no cyanosis, clubbing, rash, edema Neuro: alert & orientedx3, cranial nerves grossly intact. moves all 4 extremities w/o difficulty. Affect pleasant  Telemetry  SR -ST with PVCs brief NSVT. 90-110s   Labs    CBC Recent Labs    11/09/20 0602 11/10/20 0500  WBC 6.2 6.4  HGB 8.0* 8.1*  HCT 24.0* 24.1*  MCV 98.4 97.6  PLT 124* 144*   Basic Metabolic Panel Recent Labs    37/48/27 0602 11/10/20 0500  NA 135 136  K 3.6 3.9  CL 99 104  CO2 26 25  GLUCOSE 105* 94  BUN 14 10  CREATININE 1.11 1.08   CALCIUM 8.5* 8.6*   Liver Function Tests No results for input(s): AST, ALT, ALKPHOS, BILITOT, PROT, ALBUMIN in the last 72 hours. No results for input(s): LIPASE, AMYLASE in the last 72 hours. Cardiac Enzymes No results for input(s): CKTOTAL, CKMB, CKMBINDEX, TROPONINI in the last 72 hours.  BNP: BNP (last 3 results) Recent Labs    10/28/20 1433 11/02/20 0205  BNP 1,827.5* 1,261.6*    ProBNP (last 3 results) No results for input(s): PROBNP in the last 8760 hours.   D-Dimer No results for input(s): DDIMER in the last 72 hours. Hemoglobin A1C No results for input(s): HGBA1C in the last 72 hours. Fasting Lipid Panel No results for input(s): CHOL, HDL, LDLCALC, TRIG, CHOLHDL, LDLDIRECT in the last 72 hours. Thyroid Function Tests No results for input(s): TSH, T4TOTAL, T3FREE, THYROIDAB in the last 72 hours.  Invalid input(s): FREET3  Other results:   Imaging    No results found.   Medications:     Scheduled Medications: . aspirin EC  325 mg Oral Daily   Or  . aspirin  324 mg Per Tube Daily  . atorvastatin  80 mg Oral Daily  . bisacodyl  10 mg Oral Daily   Or  . bisacodyl  10 mg Rectal Daily  . Chlorhexidine Gluconate Cloth  6 each Topical Daily  . dapagliflozin propanediol  10 mg Oral  Daily  . digoxin  0.125 mg Oral Daily  . docusate sodium  200 mg Oral Daily  . feeding supplement  237 mL Oral BID BM  . insulin aspart  0-24 Units Subcutaneous TID AC & HS  . mouth rinse  15 mL Mouth Rinse BID  . midodrine  2.5 mg Oral TID WC  . multivitamin with minerals  1 tablet Oral Daily  . pantoprazole  40 mg Oral Daily  . sodium chloride flush  10-40 mL Intracatheter Q12H  . sodium chloride flush  3 mL Intravenous Q12H  . Warfarin - Pharmacist Dosing Inpatient   Does not apply q1600    Infusions: . heparin 900 Units/hr (11/11/20 0359)  . lactated ringers      PRN Medications: ipratropium-albuterol, lactated ringers, metoprolol tartrate, ondansetron (ZOFRAN)  IV, oxyCODONE, traMADol   Assessment/Plan   1. CAD: No chest pain, presented with dyspnea.  NSTEMI with HS-TnI elevated to peak 746.  Cath showed severe multivessel CAD with reasonable targets for CABG.  However, EF < 20%.  Cardiac MRI suggested extensive viability (except for apical septal wall and true apex).  Now s/p CABG x 4 on 5/10.  - ASA + atorvastatin 80 mg daily.  - Continue farxiga. 10 mg daily  2. Acute systolic CHF: Ischemic cardiomyopathy.  Echo this admission with EF <20%, the septum and apex were akinetic, RV function looked low normal, moderate MR.  Cardiac MRI showed EF 14% with scar in the apical septal wall and apex, mildly decreased RV systolic function. Suspect significant viability as above.  Now s/p CABG.  Now off NE. Off DBA.  - Continue digoxin 0.125 daily, dig level today   - Add spironolactone 12.5 mg daily.   - Stop midodrine  - Continue Farxiga 10 mg dialy.  - Would hope for improvement in function with revascularization.  3. LV thrombus: Small, noted on echo and cMRI.  - on heparin/warfarin per TCTS INR 1.0 4. Mitral regurgitation: Likely functional.  Moderate.  5. Hyperlipidemia: Very high LDL.   - Atorvastatin 80 daily.  - May need Repatha in future.  6. Thrombocytopenia: Post-op, improved. No cbc today  7. PVCs/NSVT Check K and Mag. Consider coreg if BP remains stable.   Stop midodrine. Add spironolactone 12.5 mg daily.  Labs pending.   Length of Stay: 14  Tonye Becket, NP  11/11/2020, 7:14 AM  Patient seen with NP, agree with the above note.   No complaints today but did not walk yesterday.  BP stable, now on Farxiga, digoxin, and midodrine (titatring down).   No dyspnea.   General: NAD Neck: No JVD, no thyromegaly or thyroid nodule.  Lungs: Clear to auscultation bilaterally with normal respiratory effort. CV: Nondisplaced PMI.  Heart regular S1/S2, no S3/S4, no murmur.  No peripheral edema.   Abdomen: Soft, nontender, no hepatosplenomegaly, no  distention.  Skin: Intact without lesions or rashes.  Neurologic: Alert and oriented x 3.  Psych: Normal affect. Extremities: No clubbing or cyanosis.  HEENT: Normal.   Volume status looks ok.  Think we can hold off on Lasix today .  Continue Farxiga and digoxin, stop midodrine today and add spironolactone 12.5 daily.   Needs to walk around the halls today.   Marca Ancona 11/11/2020 8:05 AM  Advanced Heart Failure Team Pager [phone] (M-F; 7a - 5p)  Please contact [facility] for night-coverage after hours (5p -7a ) and weekends on [email or web address]

## 2020-11-11 NOTE — Progress Notes (Signed)
RN called PA and reported that pt's BP is 89/62 and pt was feeling like he had "low energy" when ambulating with therapist. PA did not want to add meds at this time and is coming to bedside. RN will continue to monitor.

## 2020-11-11 NOTE — Progress Notes (Signed)
ANTICOAGULATION CONSULT NOTE - Follow Up Consult  Pharmacy Consult for Heparin + warfarin Indication: LV apical thrombus  Allergies  Allergen Reactions  . Penicillins Other (See Comments)    childhood    Patient Measurements: Height: 6\' 1"  (185.4 cm) Weight: 65.9 kg (145 lb 4.5 oz) IBW/kg (Calculated) : 79.9 kg Heparin Dosing Weight: 77 kg  Vital Signs: Temp: 98.6 F (37 C) (05/16 0818) Temp Source: Oral (05/16 0818) BP: 91/63 (05/16 0818) Pulse Rate: 109 (05/16 0943)  Labs: Recent Labs    11/09/20 0602 11/09/20 1300 11/10/20 0500 11/10/20 1442 11/10/20 2231 11/11/20 0825 11/11/20 0830 11/11/20 0948  HGB 8.0*  --  8.1*  --   --   --  9.2*  --   HCT 24.0*  --  24.1*  --   --   --  27.1*  --   PLT 124*  --  144*  --   --   --  214  --   LABPROT 13.8  --  13.6  --   --   --   --  13.7  INR 1.1  --  1.0  --   --   --   --  1.1  HEPARINUNFRC 0.17*   < > 0.58 <0.10* 0.19* 0.14*  --   --   CREATININE 1.11  --  1.08  --   --   --  1.23  --    < > = values in this interval not displayed.    Estimated Creatinine Clearance: 52.8 mL/min (by C-G formula based on SCr of 1.23 mg/dL).   Medical History: Past Medical History:  Diagnosis Date  . Anemia   . Asthma    childhood  . Blood in stool   . Childhood asthma   . GI bleed   . Kidney laceration 1999  . 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 PTA the patient is not on anticoagulation. 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 CT output down, CBC stable - pharmacy asked to start IV heparin and warfarin. Pt previously therapeutic on 1150 units/h of heparin - given recent OSH will target lower end of heparin goal and defer bolus and start drip rate lower.  Heparin level subtherapeutic at 0.14, INR remains subtherapeutic as expected at this point. CBC stable.  Goal of Therapy:  Heparin level 0.3-0.5 units/ml Monitor platelets by anticoagulation protocol: Yes   Plan:   Warfarin 7.5 mg x1 tonight Increase heparin to 1000 units/h Daily heparin level, CBC, INR  78, PharmD, BCPS, [facility] Clinical Pharmacist [phone] Please check AMION for all [facility] Pharmacy numbers 11/11/2020

## 2020-11-11 NOTE — Anesthesia Postprocedure Evaluation (Addendum)
Anesthesia Post Note  Patient: Troy Johnson  Procedure(s) Performed: CORONARY ARTERY BYPASS GRAFTING (CABG) (N/A Chest) TRANSESOPHAGEAL ECHOCARDIOGRAM (TEE) (N/A )     Patient location during evaluation: SICU Anesthesia Type: General Level of consciousness: sedated Pain management: pain level controlled Vital Signs Assessment: post-procedure vital signs reviewed and stable Respiratory status: patient remains intubated per anesthesia plan Cardiovascular status: stable Postop Assessment: no apparent nausea or vomiting Anesthetic complications: no   No complications documented.  Last Vitals:  Vitals:   11/11/20 0943 11/11/20 1138  BP:  92/69  Pulse: (!) 109 93  Resp:  16  Temp:  36.6 C  SpO2:  98%    Last Pain:  Vitals:   11/11/20 1138  TempSrc: Oral  PainSc: 0-No pain                 Kamron Vanwyhe

## 2020-11-11 NOTE — Progress Notes (Addendum)
      [address]       [name] [address]             [phone]       6 Days Post-Op Procedure(s) (LRB): CORONARY ARTERY BYPASS GRAFTING (CABG) (N/A) TRANSESOPHAGEAL ECHOCARDIOGRAM (TEE) (N/A)   Subjective:  Up in chair eating breakfast.  Doing pretty well.  No specific complaints.  + ambulation  Objective: Vital signs in last 24 hours: Temp:  [98.2 F (36.8 C)-99.5 F (37.5 C)] 98.9 F (37.2 C) (05/16 0439) Pulse Rate:  [93-102] 99 (05/16 0439) Cardiac Rhythm: Sinus tachycardia (05/16 0700) Resp:  [14-20] 17 (05/16 0439) BP: (103-123)/(69-90) 122/80 (05/16 0439) SpO2:  [96 %-100 %] 98 % (05/16 0439) Weight:  [65.9 kg] 65.9 kg (05/16 0439)  Hemodynamic parameters for last 24 hours: CVP:  [5 mmHg] 5 mmHg  Intake/Output from previous day: 05/15 0701 - 05/16 0700 In: 1231.7 [P.O.:1040; I.V.:191.7] Out: 540 [Urine:540]  General appearance: alert, cooperative and no distress Heart: regular rate and rhythm Lungs: clear to auscultation bilaterally Abdomen: soft, non-tender; bowel sounds normal; no masses,  no organomegaly Extremities: edema none present Wound: clean and dry  Lab Results: Recent Labs    11/09/20 0602 11/10/20 0500  WBC 6.2 6.4  HGB 8.0* 8.1*  HCT 24.0* 24.1*  PLT 124* 144*   BMET:  Recent Labs    11/09/20 0602 11/10/20 0500  NA 135 136  K 3.6 3.9  CL 99 104  CO2 26 25  GLUCOSE 105* 94  BUN 14 10  CREATININE 1.11 1.08  CALCIUM 8.5* 8.6*    PT/INR:  Recent Labs    11/10/20 0500  LABPROT 13.6  INR 1.0   ABG    Component Value Date/Time   PHART 7.325 (L) 11/05/2020 1855   HCO3 17.2 (L) 11/05/2020 1855   TCO2 18 (L) 11/05/2020 1855   ACIDBASEDEF 8.0 (H) 11/05/2020 1855   O2SAT 70.2 11/10/2020 0600   CBG (last 3)  Recent Labs    11/10/20 1604 11/10/20 2117 11/11/20 0610  GLUCAP 102* 102* 81    Assessment/Plan: S/P Procedure(s) (LRB): CORONARY ARTERY BYPASS GRAFTING (CABG) (N/A) TRANSESOPHAGEAL  ECHOCARDIOGRAM (TEE) (N/A)  1. CV- NSR with PVCs- off drips, Midodrine as been discontinued- remains on Digoxin, Farxiga 2. LV thrombus- on Heparin and coumadin, INR was 1.0 3. Pulm- off oxygen, continue IS 4. Renal- creatinine has been stable on Aldactone, K is 3.9 5. CBGs controlled- patient not a diabetic, will stop SSIP 6. Dispo- patient stable, making progress, AHF optimizing patient's heart function, continue current care   LOS: 14 days    Lowella Dandy, PA-C 11/11/2020   Doing well. Off dobutamine. Awaiting therapeutic INR.  Dispo planning.  Likely Wednesday.  Shahir Karen Keane Scrape

## 2020-11-11 NOTE — Progress Notes (Signed)
CARDIAC REHAB PHASE I   PRE:  Rate/Rhythm: 106 ST    BP: sitting 83/48    SaO2: 98 RA  MODE:  Ambulation: 140 ft in hall   POST:  Rate/Rhythm: 115 ST with PVCs    BP: sitting 110/62     SaO2: 96 RA  Pt c/o feeling tired, lacking energy today. Sts he felt much better on ICU and could walk farther. Slow pace with RW. Declined walking farther or resting then starting again. BP low in recliner, increased with walking. To bed for rest, wants to walk again later. Pt practiced IS, 1750 ml consistently.   [id number]  Harriet Masson CES, ACSM 11/11/2020 1:55 PM

## 2020-11-11 NOTE — Discharge Summary (Signed)
[address]       [place] [address]             [phone]    Physician Discharge Summary  Patient ID: Troy Johnson MRN: [id number] DOB/AGE: 10/16/1950 70 y.o.  Admit date: 10/28/2020 Discharge date: 11/14/2020  Admission Diagnoses:  Patient Active Problem List   Diagnosis Date Noted  . Malnutrition of moderate degree 11/02/2020  . Acute CHF (congestive heart failure) (HCC) 10/28/2020  . HFrEF (heart failure with reduced ejection fraction) (HCC) 10/28/2020  . NSTEMI (non-ST elevated myocardial infarction) (HCC)   . Prediabetes 12/10/2019  . Anemia 12/16/2016  . Syncope 12/16/2016  . H/O: GI bleed 12/09/2016     Discharge Diagnoses:  Patient Active Problem List   Diagnosis Date Noted  . 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 Malnutrition of moderate degree 11/02/2020  . Acute CHF (congestive heart failure) (HCC) 10/28/2020  . HFrEF (heart failure with reduced ejection fraction) (HCC) 10/28/2020  . NSTEMI (non-ST elevated myocardial infarction) (HCC)   . Prediabetes 12/10/2019  . Anemia 12/16/2016  . Syncope 12/16/2016  . H/O: GI bleed 12/09/2016    Discharged Condition: good  History of Present Illness:  Patient is a 70 year old African American male with a past medical history of remote tobacco abuse, possible seizure, anemia/possibleGI bleed who presented to [facility] ED with complaints of progressive shortness of breath. Patient has had progressive shortness of breath, which began last Friday 04/29. He denies chest pain, coughing LE edema, fever. He also had orthopnea so he went to his primary care physician on 05/ 02. Initially, EMS called a code STEMI but Dr. Rosemary Johnson reviewed the EKG and cancelled code STEMI. CXR showed borderline mild congestive heart failure and trace right pleural effusion and initial BNP was 1827. In addition, initial Troponin I (high sensitivity) was 656 and went up to 746. Patient did rule in a for a NSTEMI. Patient was given  IV Lasix and Coreg. Patient underwent a cardiac catheterization on 10/29/2020 which showed a 90% proximal stenosis of the LAD, 95% stenosis in first Diagonal, a 95% stenosis of the ostial Circumflex to Proximal Circumflex, and an 80% proximal RCA stenosis with a 100% proximal to distal RCA stenosis, and a 70% stenosis of the RPDA. The patient was admitted for further care and cardiothoracic consultation was requested.  Hospital Course:  The patient was evaluated by Dr. Cliffton Johnson for possible coronary bypass grafting.  Review of patient's imaging workup revealed the patient to have an EF of less than 20%.  There was also evidence of global hypokinesis with LV apical thrombus.  Due to this it was felt the patient would require a viability study prior to deciding if surgery was an option.  This was performed and showed evidence of viability.  Advanced heart failure team was consulted for medical optimization prior to proceeding with surgical intervention.  The patient remained chest pain free and was taken to the operating room on 11/05/2020.  He underwent CABG x 4 utilizing LIMA to LAD, SVG to PDA, SVG to OM, and SVG to Diagonal.  He also underwent endoscopic harvest of greater saphenous vein from his left leg.  He tolerated the procedure without difficulty and was taken to the SICU in stable condition.  He was extubated the evening of surgery.  Post operatively he required pressor/inotropic support with  Levophed and Dobutamine .  These were weaned off as hemodynamics allowed on 5/12 and 5/14.  His  home digoxin was resumed.  His chest tubes were removed without difficulty.  He has remained hypotensive and was started on Midodrine.  He was started on Coumadin on 5/13 for his LV thrombus.  Daily PT and INR were monitored. He was maintaining NSR with PVCs.  He was started on Farxiga.   He was transferred to the progressive care unit on 11/09/2020.  His BP improved and he was weaned off Midodrine with last dose on  5/16. Patient's INR on 05/18 went from 1.3 to 1.8. Heparin drip was continued. Midodrine 5 mg tid was restarted on 05/17 and he was started on Coreg 3.125 mg bid. Midodrine was then decreased to 2.5 mg tid on 05/18 and Spironolactone was increased to 25 mg daily. Per heart failure, Midodrine was stopped as not symptomatic with SBP in the low 100's. INR was up to 2.2 on 05/19. As discussed with advanced heart failure, the patient is felt stable for discharge today.  Consults: Advance Heart Failure  Significant Diagnostic Studies: angiography:   LM: Long vessel. Normal LAD: Prox 90% stenosis, followed by flush occlusion after Diag 1        No antegrade flow seen in LAD        Faint collaterals from RPL fill small caliber LAD        Diag 1 with prox tandem 95% stenoses LCx: Prox tandem 95% stenoses        Prox OM1 60% stenosis        Mid LCx 60% stenosis RCA: Prox 90% stenosis, followed by 100% occlusion         Bridging collaterals faintly fill mid RCA, and reconstitute distal RCA         Prox RPDA 70% stenosis, followed by 50 stenosis  PA: 39/14 mmHg, mean PAP 23 mmHg PCW: 6 mmHg  Compensated ischemic cardiomyopathy Treatments: surgery:   11/05/2020 Patient:  Troy Johnson Pre-Op Dx:     3V CAD                         Congestive heart failure with EF < 15%                         NSTEMI                            Post-op Dx:  same Procedure: CABG X 4.  LIMA LAD, RSVG D2, OM1, and PDA   Endoscopic greater saphenous vein harvest on the left   Surgeon and Role:      * Lightfoot, Eliezer Lofts, MD - Primary    * E. Barrett, PA-C - assisting   Discharge Exam: Blood pressure 100/71, pulse 89, temperature 98 F (36.7 C), temperature source Oral, resp. rate 19, height 6\' 1"  (1.854 m), weight 65.9 kg, SpO2 96 %. Cardiovascular: RRR Pulmonary: Clear to auscultation bilaterally Abdomen: Soft, non tender, bowel sounds present. Extremities:No lower extremity edema. Wounds: Clean  and dry.  No erythema or signs of infection.    Discharge Medications:  The patient has been discharged on:   1.Beta Blocker:  Yes [ x  ]                              No   [   ]  If No, reason:  2.Ace Inhibitor/ARB: Yes [   ]                                     No  [ x   ]                                     If No, reason:Hypotension  3.Statin:   Yes [ x  ]                  No  [   ]                  If No, reason:  4.Ecasa:  Yes  [  x ]                  No   [   ]                  If No, reason:    Discharge Instructions    Amb Referral to Cardiac Rehabilitation   Complete by: As directed    Diagnosis: CABG   CABG X ___: 4   After initial evaluation and assessments completed: Virtual Based Care may be provided alone or in conjunction with Phase 2 Cardiac Rehab based on patient barriers.: Yes     Allergies as of 11/14/2020      Reactions   Penicillins Other (See Comments)   childhood      Medication List    STOP taking these medications   olmesartan 20 MG tablet Commonly known as: BENICAR     TAKE these medications   aspirin 81 MG EC tablet Take 1 tablet (81 mg total) by mouth daily. Swallow whole.   atorvastatin 80 MG tablet Commonly known as: LIPITOR Take 1 tablet (80 mg total) by mouth daily. Start taking on: Nov 15, 2020   carvedilol 3.125 MG tablet Commonly known as: COREG Take 1 tablet (3.125 mg total) by mouth 2 (two) times daily with a meal.   digoxin 0.125 MG tablet Commonly known as: LANOXIN Take 1 tablet (0.125 mg total) by mouth daily. Start taking on: Nov 15, 2020   Farxiga 10 MG Tabs tablet Generic drug: dapagliflozin propanediol Take 1 tablet (10 mg total) by mouth daily. Start taking on: Nov 15, 2020   multivitamin with minerals Tabs tablet Take 1 tablet by mouth daily. Start taking on: Nov 15, 2020   oxyCODONE 5 MG immediate release tablet Commonly known as: Oxy IR/ROXICODONE Take 1 tablet (5 mg  total) by mouth every 4 (four) hours as needed for severe pain.   spironolactone 25 MG tablet Commonly known as: ALDACTONE Take 1 tablet (25 mg total) by mouth daily. Start taking on: Nov 15, 2020   warfarin 5 MG tablet Commonly known as: COUMADIN Take 1 tablet (5 mg total) by mouth daily at 4 PM.            Durable Medical Equipment  (From admission, onward)         Start     Ordered   11/11/20 0741  For home use only DME 3 n 1  Once        11/11/20 0740   11/11/20 0741  For home use only DME [place] rolling  Once  Question Answer Comment  Wass: With 5 Inch Wheels   Patient needs a [place] to treat with the following condition Physical deconditioning      11/11/20 0740          Follow-up Information    Corliss Skains, MD Follow up on 11/22/2020.   Specialty: Cardiothoracic Surgery Why: You do not need to come to our office.  This appointment will be virtual.  Dr. Cliffton Johnson will contact you Contact information: [address] [place] [address] [phone]        [facility] [employer]. Go on 11/18/2020.   Specialty: Cardiology Why: Appointment is for PT/INR to be drawn as on Coumadin for LV apical thrombus. Appointment time is at 10:30 am Contact information: [address] [place] [address] [phone]       [facility] Follow up.   Specialty: Cardiology Why: December 05, 2020 at 9:30 AM at the Advanced Heart Failure Clinic at [facility], Entrance C Parking Garage Code 4233 Contact information: [address] [id number] [place] [address] [phone]              Signed: Doree Fudge PA-C 11/14/2020, 12:40 PM

## 2020-11-11 NOTE — Progress Notes (Signed)
Mobility Specialist: Progress Note   11/11/20 1713  Mobility  Activity Ambulated in hall  Level of Assistance Standby assist, set-up cues, supervision of patient - no hands on  Assistive Device Front wheel Deyo  Distance Ambulated (ft) 160 ft  Mobility Ambulated independently in hallway  Mobility Response Tolerated well  Mobility performed by Mobility specialist  Bed Position Chair  $Mobility charge 1 Mobility   Pre-Mobility: 112 HR During Mobility: 130 HR Post-Mobility: 110 HR, 81/58 (67) BP, 97% SpO2  Pt said he felt a little "woozy" in the room but agreeable to ambulate. Pt otherwise asx. Pt to chair after walk with family member present in room. RN notified of low BP.   [facility] Cain Fitzhenry Mobility Specialist Mobility Specialist Phone: [phone]

## 2020-11-11 NOTE — Progress Notes (Signed)
Physical Therapy Treatment Patient Details Name: Troy Johnson MRN: [id number] DOB: 1950/11/14 Today's Date: 11/11/2020    History of Present Illness 70 yo admitted 5/2 with SOB with acute CHF and NSTEMI. Cath 5/3. CABG x 4 5/10. PMhx: HTN, asthma, seizure    PT Comments    Pt pleasant sitting in chair on arrival stating he did not walk over the weekend. Pt able to state and maintain precautions during session with limited improvement in walking distance and HEP. Pt educated for progression to gait 3x/day with visual reminder placed on white board. Will continue to follow to maximize gait and ability to ascend stairs prior to D/C.   HR 115 with gait, SpO2 90-97% on RA Pre gait 92/72 (79) Post 81/72 (77)    Follow Up Recommendations  No PT follow up;Supervision - Intermittent     Equipment Recommendations  3in1 (PT);Rolling [place] with 5" wheels    Recommendations for Other Services       Precautions / Restrictions Precautions Precautions: Sternal Precaution Comments: pt able to state precautions Restrictions Weight Bearing Restrictions: Yes    Mobility  Bed Mobility Overal bed mobility: Modified Independent Bed Mobility: Sit to Supine                Transfers Overall transfer level: Needs assistance   Transfers: Sit to/from Stand Sit to Stand: Supervision         General transfer comment: cues for hand placement  Ambulation/Gait Ambulation/Gait assistance: Supervision Gait Distance (Feet): 180 Feet Assistive device: Rolling [place] (2 wheeled) Gait Pattern/deviations: Step-through pattern;Decreased stride length   Gait velocity interpretation: 1.31 - 2.62 ft/sec, indicative of limited community ambulator General Gait Details: cues for proximity to RW and maximizing distance.Pt declined further gait and not ready for stairs   Stairs             Wheelchair Mobility    Modified Rankin (Stroke Patients Only)       Balance Overall balance  assessment: Mild deficits observed, not formally tested                                          Cognition Arousal/Alertness: Awake/alert Behavior During Therapy: WFL for tasks assessed/performed Overall Cognitive Status: Within Functional Limits for tasks assessed                                        Exercises General Exercises - Lower Extremity Long Arc Quad: AROM;Both;Seated;20 reps Hip Flexion/Marching: AROM;Seated;Both;20 reps    General Comments        Pertinent Vitals/Pain Pain Assessment: No/denies pain    Home Living                      Prior Function            PT Goals (current goals can now be found in the care plan section) Progress towards PT goals: Progressing toward goals    Frequency    Min 3X/week      PT Plan Current plan remains appropriate    Co-evaluation              AM-PAC PT "6 Clicks" Mobility   Outcome Measure  Help needed turning from your back to your side while in a flat bed without using  bedrails?: A Little Help needed moving from lying on your back to sitting on the side of a flat bed without using bedrails?: A Little Help needed moving to and from a bed to a chair (including a wheelchair)?: A Little Help needed standing up from a chair using your arms (e.g., wheelchair or bedside chair)?: A Little Help needed to walk in hospital room?: A Little Help needed climbing 3-5 steps with a railing? : A Little 6 Click Score: 18    End of Session   Activity Tolerance: Patient tolerated treatment well Patient left: in bed;with call bell/phone within reach Nurse Communication: Mobility status;Precautions PT Visit Diagnosis: Other abnormalities of gait and mobility (R26.89)     Time: 1308-6578 PT Time Calculation (min) (ACUTE ONLY): 22 min  Charges:  $Gait Training: 8-22 mins                     Icela Glymph P, PT Acute Rehabilitation Services Pager: [phone] Office:  [phone]    Blaiden Werth B Phyillis Dascoli 11/11/2020, 9:35 AM

## 2020-11-12 ENCOUNTER — Other Ambulatory Visit (HOSPITAL_COMMUNITY): Payer: Self-pay

## 2020-11-12 DIAGNOSIS — I5020 Unspecified systolic (congestive) heart failure: Secondary | ICD-10-CM | POA: Diagnosis not present

## 2020-11-12 LAB — CBC
HCT: 25.7 % — ABNORMAL LOW (ref 39.0–52.0)
Hemoglobin: 8.5 g/dL — ABNORMAL LOW (ref 13.0–17.0)
MCH: 32.2 pg (ref 26.0–34.0)
MCHC: 33.1 g/dL (ref 30.0–36.0)
MCV: 97.3 fL (ref 80.0–100.0)
Platelets: 261 10*3/uL (ref 150–400)
RBC: 2.64 MIL/uL — ABNORMAL LOW (ref 4.22–5.81)
RDW: 14.9 % (ref 11.5–15.5)
WBC: 6.7 10*3/uL (ref 4.0–10.5)
nRBC: 0 % (ref 0.0–0.2)

## 2020-11-12 LAB — PROTIME-INR
INR: 1.3 — ABNORMAL HIGH (ref 0.8–1.2)
Prothrombin Time: 16.3 seconds — ABNORMAL HIGH (ref 11.4–15.2)

## 2020-11-12 LAB — BASIC METABOLIC PANEL
Anion gap: 9 (ref 5–15)
BUN: 14 mg/dL (ref 8–23)
CO2: 24 mmol/L (ref 22–32)
Calcium: 9 mg/dL (ref 8.9–10.3)
Chloride: 102 mmol/L (ref 98–111)
Creatinine, Ser: 1.19 mg/dL (ref 0.61–1.24)
GFR, Estimated: >60 mL/min (ref >60)
Glucose, Bld: 95 mg/dL (ref 70–99)
Potassium: 4.3 mmol/L (ref 3.5–5.1)
Sodium: 135 mmol/L (ref 135–145)

## 2020-11-12 LAB — HEPARIN LEVEL (UNFRACTIONATED)
Heparin Unfractionated: 0.15 IU/mL — ABNORMAL LOW (ref 0.30–0.70)
Heparin Unfractionated: 0.24 IU/mL — ABNORMAL LOW (ref 0.30–0.70)

## 2020-11-12 LAB — GLUCOSE, CAPILLARY
Glucose-Capillary: 145 mg/dL — ABNORMAL HIGH (ref 70–99)
Glucose-Capillary: 98 mg/dL (ref 70–99)

## 2020-11-12 LAB — MAGNESIUM: Magnesium: 2 mg/dL (ref 1.7–2.4)

## 2020-11-12 MED ORDER — ASPIRIN EC 81 MG PO TBEC
81.0000 mg | DELAYED_RELEASE_TABLET | Freq: Every day | ORAL | Status: DC
  Administered 2020-11-13 – 2020-11-14 (×2): 81 mg via ORAL
  Filled 2020-11-12 (×2): qty 1

## 2020-11-12 MED ORDER — ASPIRIN 81 MG PO CHEW: 81.0000 mg | CHEWABLE_TABLET | Freq: Every day | ORAL | Status: DC

## 2020-11-12 MED ORDER — CARVEDILOL 3.125 MG PO TABS
3.1250 mg | ORAL_TABLET | Freq: Two times a day (BID) | ORAL | Status: DC
  Administered 2020-11-13 – 2020-11-14 (×3): 3.125 mg via ORAL
  Filled 2020-11-12 (×3): qty 1

## 2020-11-12 MED ORDER — MIDODRINE HCL 5 MG PO TABS
5.0000 mg | ORAL_TABLET | Freq: Three times a day (TID) | ORAL | Status: DC
  Administered 2020-11-12 – 2020-11-13 (×3): 5 mg via ORAL
  Filled 2020-11-12 (×3): qty 1

## 2020-11-12 MED ORDER — WARFARIN SODIUM 7.5 MG PO TABS
7.5000 mg | ORAL_TABLET | Freq: Once | ORAL | Status: AC
  Administered 2020-11-12: 7.5 mg via ORAL
  Filled 2020-11-12: qty 1

## 2020-11-12 MED ORDER — SPIRONOLACTONE 25 MG PO TABS
25.0000 mg | ORAL_TABLET | Freq: Every day | ORAL | Status: DC
  Administered 2020-11-12 – 2020-11-14 (×3): 25 mg via ORAL
  Filled 2020-11-12 (×3): qty 1

## 2020-11-12 NOTE — Progress Notes (Addendum)
      [address]       [place] [address]             [phone]        7 Days Post-Op Procedure(s) (LRB): CORONARY ARTERY BYPASS GRAFTING (CABG) (N/A) TRANSESOPHAGEAL ECHOCARDIOGRAM (TEE) (N/A)  Subjective: Patient just about to eat breakfast;he states food is not too good  Objective: Vital signs in last 24 hours: Temp:  [97.9 F (36.6 C)-98.7 F (37.1 C)] 98.4 F (36.9 C) (05/17 0300) Pulse Rate:  [92-111] 102 (05/17 0300) Cardiac Rhythm: Sinus tachycardia (05/17 0312) Resp:  [13-22] 16 (05/17 0300) BP: (81-129)/(58-85) 121/84 (05/17 0300) SpO2:  [95 %-99 %] 95 % (05/17 0300) Weight:  [69.5 kg] 69.5 kg (05/17 0300)  Pre op weight 67.7 kg Current Weight  11/12/20 69.5 kg       Intake/Output from previous day: 05/16 0701 - 05/17 0700 In: 518.1 [P.O.:240; I.V.:228.1; IV Piggyback:50] Out: 550 [Urine:550]   Physical Exam:  Cardiovascular: RRR Pulmonary: Clear to auscultation bilaterally Abdomen: Soft, non tender, bowel sounds present. Extremities:No lower extremity edema. Wounds: Clean and dry.  No erythema or signs of infection.  Lab Results: CBC: Recent Labs    11/11/20 0830 11/12/20 0128  WBC 6.4 6.7  HGB 9.2* 8.5*  HCT 27.1* 25.7*  PLT 214 261   BMET:  Recent Labs    11/11/20 0830 11/12/20 0128  NA 136 135  K 4.2 4.3  CL 103 102  CO2 21* 24  GLUCOSE 140* 95  BUN 12 14  CREATININE 1.23 1.19  CALCIUM 9.2 9.0    PT/INR:  Lab Results  Component Value Date   INR 1.3 (H) 11/12/2020   INR 1.1 11/11/2020   INR 1.0 11/10/2020   ABG:  INR: Will add last result for INR, ABG once components are confirmed Will add last 4 CBG results once components are confirmed  Assessment/Plan:  1. CV - S/[ NSTEMI. ST at times.. LV thrombus so on Heparin and Coumadin;pharmacy managing. INR this am slightly increased to 1.3. On Digoxin 0.125 mg daily. 2.  Pulmonary - On room air. Encourage incentive spirometer. 3. Acute systolic  CHF-on Farxiga 10 mg daily and Spirinolactone 12.5 mg daily. Heart failure following. 4.  Expected post op acute blood loss anemia - H and H this am decreased to 8.5 and 25.7   Donielle M ZimmermanPA-C 11/12/2020,7:04 AM   Agree with above Doing well Home tomorrow if INR is therapeutic  Ayona Yniguez Keane Scrape

## 2020-11-12 NOTE — Progress Notes (Signed)
ANTICOAGULATION CONSULT NOTE - Follow Up Consult  Pharmacy Consult for heparin Indication: LV apical thrombus  Labs: Recent Labs    11/09/20 0602 11/09/20 1300 11/10/20 0500 11/10/20 1442 11/10/20 2231 11/11/20 0825 11/11/20 0830 11/11/20 0948 11/12/20 0128  HGB 8.0*  --  8.1*  --   --   --  9.2*  --  8.5*  HCT 24.0*  --  24.1*  --   --   --  27.1*  --  25.7*  PLT 124*  --  144*  --   --   --  214  --  261  LABPROT 13.8  --  13.6  --   --   --   --  13.7 16.3*  INR 1.1  --  1.0  --   --   --   --  1.1 1.3*  HEPARINUNFRC 0.17*   < > 0.58   < > 0.19* 0.14*  --   --  0.15*  CREATININE 1.11  --  1.08  --   --   --  1.23  --   --    < > = values in this interval not displayed.    Assessment: 70yo male subtherapeutic on heparin with very little change in level after rate change; no gtt issues or signs of bleeding per RN; Hgb low but stable.  Goal of Therapy:  Heparin level 0.3-0.5 units/ml   Plan:  Will increase heparin gtt by 2 units/kg/hr to 1150 units/hr and check level in 6 hours.    Vernard Gambles, PharmD, BCPS  11/12/2020,3:31 AM

## 2020-11-12 NOTE — Progress Notes (Signed)
Mobility Specialist - Progress Note   11/12/20 1442  Mobility  Activity Contraindicated/medical hold   Pt agreeable to ambulate, however, BP was 84/71. Will notify RN.   Mamie Levers Mobility Specialist Mobility Specialist Phone: [phone]

## 2020-11-12 NOTE — Plan of Care (Signed)
  Problem: Education: Goal: Knowledge of General Education information will improve Description: Including pain rating scale, medication(s)/side effects and non-pharmacologic comfort measures Outcome: Progressing   Problem: Health Behavior/Discharge Planning: Goal: Ability to manage health-related needs will improve Outcome: Progressing   Problem: Clinical Measurements: Goal: Ability to maintain clinical measurements within normal limits will improve Outcome: Progressing Goal: Will remain free from infection Outcome: Progressing Goal: Diagnostic test results will improve Outcome: Progressing Goal: Respiratory complications will improve Outcome: Progressing Goal: Cardiovascular complication will be avoided Outcome: Progressing   Problem: Activity: Goal: Risk for activity intolerance will decrease Outcome: Progressing   Problem: Nutrition: Goal: Adequate nutrition will be maintained Outcome: Progressing   Problem: Coping: Goal: Level of anxiety will decrease Outcome: Progressing   Problem: Elimination: Goal: Will not experience complications related to bowel motility Outcome: Progressing Goal: Will not experience complications related to urinary retention Outcome: Progressing   Problem: Pain Managment: Goal: General experience of comfort will improve Outcome: Progressing   Problem: Safety: Goal: Ability to remain free from injury will improve Outcome: Progressing   Problem: Skin Integrity: Goal: Risk for impaired skin integrity will decrease Outcome: Progressing   Problem: Education: Goal: Ability to demonstrate management of disease process will improve Outcome: Progressing Goal: Ability to verbalize understanding of medication therapies will improve Outcome: Progressing Goal: Individualized Educational Video(s) Outcome: Progressing   Problem: Activity: Goal: Capacity to carry out activities will improve Outcome: Progressing   Problem: Cardiac: Goal:  Ability to achieve and maintain adequate cardiopulmonary perfusion will improve Outcome: Progressing   Problem: Education: Goal: Will demonstrate proper wound care and an understanding of methods to prevent future damage Outcome: Progressing Goal: Knowledge of disease or condition will improve Outcome: Progressing Goal: Knowledge of the prescribed therapeutic regimen will improve Outcome: Progressing Goal: Individualized Educational Video(s) Outcome: Progressing   Problem: Activity: Goal: Risk for activity intolerance will decrease Outcome: Progressing   Problem: Cardiac: Goal: Will achieve and/or maintain hemodynamic stability Outcome: Progressing   Problem: Clinical Measurements: Goal: Postoperative complications will be avoided or minimized Outcome: Progressing   Problem: Respiratory: Goal: Respiratory status will improve Outcome: Progressing   Problem: Skin Integrity: Goal: Wound healing without signs and symptoms of infection Outcome: Progressing Goal: Risk for impaired skin integrity will decrease Outcome: Progressing   Problem: Urinary Elimination: Goal: Ability to achieve and maintain adequate renal perfusion and functioning will improve Outcome: Progressing   

## 2020-11-12 NOTE — Progress Notes (Signed)
Mobility Specialist - Progress Note   11/12/20 1133  Mobility  Activity Refused mobility   Pt refused, stating he was hypotensive earlier and is feeling fatigued. Will f/u as able.   Mamie Levers Mobility Specialist Mobility Specialist Phone: [phone]

## 2020-11-12 NOTE — Progress Notes (Signed)
ANTICOAGULATION CONSULT NOTE - Follow Up Consult  Pharmacy Consult for Heparin + warfarin Indication: LV apical thrombus  Allergies  Allergen Reactions  . Penicillins Other (See Comments)    childhood    Patient Measurements: Height: 6\' 1"  (185.4 cm) Weight: 69.5 kg (153 lb 3.5 oz) IBW/kg (Calculated) : 79.9 kg Heparin Dosing Weight: 77 kg  Vital Signs: Temp: 97.7 F (36.5 C) (05/17 0807) Temp Source: Oral (05/17 0807) BP: 85/69 (05/17 0807) Pulse Rate: 105 (05/17 0807)  Labs: Recent Labs    11/10/20 0500 11/10/20 1442 11/11/20 0825 11/11/20 0830 11/11/20 0948 11/12/20 0128 11/12/20 0935  HGB 8.1*  --   --  9.2*  --  8.5*  --   HCT 24.1*  --   --  27.1*  --  25.7*  --   PLT 144*  --   --  214  --  261  --   LABPROT 13.6  --   --   --  13.7 16.3*  --   INR 1.0  --   --   --  1.1 1.3*  --   HEPARINUNFRC 0.58   < > 0.14*  --   --  0.15* 0.24*  CREATININE 1.08  --   --  1.23  --  1.19  --    < > = values in this interval not displayed.    Estimated Creatinine Clearance: 57.6 mL/min (by C-G formula based on SCr of 1.19 mg/dL).   Medical History: Past Medical History:  Diagnosis Date  . Anemia   . Asthma    childhood  . Blood in stool   . Childhood asthma   . GI bleed   . Kidney laceration 1999  . Seizures [facility])    Assessment: 70 yo male presented with SOB and found to have mvCAD and an apical thrombus. PTA the patient is not on anticoagulation. 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 CT output down, CBC stable - pharmacy asked to start IV heparin and warfarin. Pt previously therapeutic on 1150 units/h of heparin - given recent OSH will target lower end of heparin goal and defer bolus and start drip rate lower.  Heparin level subtherapeutic at 0.24 but trending up, INR increasing to 1.3, CBC stable.  Goal of Therapy:  Heparin level 0.3-0.5 units/ml Monitor platelets by anticoagulation protocol: Yes   Plan:  Warfarin 7.5 mg x1 again tonight Increase heparin to 1200  units/h Daily heparin level, CBC, INR  78, PharmD, BCPS, [facility] Clinical Pharmacist [phone] Please check AMION for all [facility] Pharmacy numbers 11/12/2020

## 2020-11-12 NOTE — Plan of Care (Signed)
  Problem: Education: Goal: Knowledge of General Education information will improve Description: Including pain rating scale, medication(s)/side effects and non-pharmacologic comfort measures Outcome: Progressing   Problem: Health Behavior/Discharge Planning: Goal: Ability to manage health-related needs will improve Outcome: Progressing   Problem: Clinical Measurements: Goal: Ability to maintain clinical measurements within normal limits will improve Outcome: Progressing   Problem: Clinical Measurements: Goal: Cardiovascular complication will be avoided Outcome: Progressing   Problem: Activity: Goal: Risk for activity intolerance will decrease Outcome: Progressing   Problem: Nutrition: Goal: Adequate nutrition will be maintained Outcome: Progressing   Problem: Pain Managment: Goal: General experience of comfort will improve Outcome: Progressing   Problem: Skin Integrity: Goal: Risk for impaired skin integrity will decrease Outcome: Progressing   Problem: Education: Goal: Ability to demonstrate management of disease process will improve Outcome: Progressing   Problem: Education: Goal: Ability to verbalize understanding of medication therapies will improve Outcome: Progressing   Problem: Activity: Goal: Capacity to carry out activities will improve Outcome: Progressing   Problem: Education: Goal: Will demonstrate proper wound care and an understanding of methods to prevent future damage Outcome: Progressing   Problem: Education: Goal: Knowledge of disease or condition will improve Outcome: Progressing   Problem: Clinical Measurements: Goal: Postoperative complications will be avoided or minimized Outcome: Progressing   Problem: Skin Integrity: Goal: Wound healing without signs and symptoms of infection Outcome: Progressing

## 2020-11-12 NOTE — Progress Notes (Addendum)
Patient ID: Troy Johnson, male   DOB: 1951/05/10, 70 y.o.   MRN: [id number]     Advanced Heart Failure Rounding Note  PCP-Cardiologist: None   Subjective:    CABG (5/10): LIMA-LAD, SVG-D2, SVG-OM1, SVG-PDA.   11/10/20 Farxiga added.  11/11/20 Midodrine stopped and spiro added.    Walked 3 times. Denies SOB.    Objective:   Weight Range: 69.5 kg Body mass index is 20.21 kg/m.   Vital Signs:   Temp:  [97.9 F (36.6 C)-98.7 F (37.1 C)] 98.4 F (36.9 C) (05/17 0300) Pulse Rate:  [92-111] 102 (05/17 0300) Resp:  [13-22] 16 (05/17 0300) BP: (81-129)/(58-85) 121/84 (05/17 0300) SpO2:  [95 %-99 %] 95 % (05/17 0300) Weight:  [69.5 kg] 69.5 kg (05/17 0300) Last BM Date: 11/09/20  Weight change: Filed Weights   11/10/20 0500 11/11/20 0439 11/12/20 0300  Weight: 67.7 kg 65.9 kg 69.5 kg    Intake/Output:   Intake/Output Summary (Last 24 hours) at 11/12/2020 0719 Last data filed at 11/12/2020 0338 Gross per 24 hour  Intake 518.07 ml  Output 550 ml  Net -31.93 ml      Physical Exam  General: Sitting on the side of the bed. No resp difficulty HEENT: normal Neck: supple. no JVD. Carotids 2+ bilat; no bruits. No lymphadenopathy or thryomegaly appreciated. Cor: PMI nondisplaced. Regular rate & rhythm. No rubs, gallops or murmurs. Lungs: clear Abdomen: soft, nontender, nondistended. No hepatosplenomegaly. No bruits or masses. Good bowel sounds. Extremities: no cyanosis, clubbing, rash, edema Neuro: alert & orientedx3, cranial nerves grossly intact. moves all 4 extremities w/o difficulty. Affect pleasant   Telemetry  SR-ST with PVCs. 90-100s   Labs    CBC Recent Labs    11/11/20 0830 11/12/20 0128  WBC 6.4 6.7  HGB 9.2* 8.5*  HCT 27.1* 25.7*  MCV 95.8 97.3  PLT 214 261   Basic Metabolic Panel Recent Labs    64/33/29 0830 11/12/20 0128  NA 136 135  K 4.2 4.3  CL 103 102  CO2 21* 24  GLUCOSE 140* 95  BUN 12 14  CREATININE 1.23 1.19  CALCIUM 9.2 9.0   MG 1.8 2.0   Liver Function Tests No results for input(s): AST, ALT, ALKPHOS, BILITOT, PROT, ALBUMIN in the last 72 hours. No results for input(s): LIPASE, AMYLASE in the last 72 hours. Cardiac Enzymes No results for input(s): CKTOTAL, CKMB, CKMBINDEX, TROPONINI in the last 72 hours.  BNP: BNP (last 3 results) Recent Labs    10/28/20 1433 11/02/20 0205  BNP 1,827.5* 1,261.6*    ProBNP (last 3 results) No results for input(s): PROBNP in the last 8760 hours.   D-Dimer No results for input(s): DDIMER in the last 72 hours. Hemoglobin A1C No results for input(s): HGBA1C in the last 72 hours. Fasting Lipid Panel No results for input(s): CHOL, HDL, LDLCALC, TRIG, CHOLHDL, LDLDIRECT in the last 72 hours. Thyroid Function Tests No results for input(s): TSH, T4TOTAL, T3FREE, THYROIDAB in the last 72 hours.  Invalid input(s): FREET3  Other results:   Imaging    No results found.   Medications:     Scheduled Medications: . aspirin EC  325 mg Oral Daily   Or  . aspirin  324 mg Per Tube Daily  . atorvastatin  80 mg Oral Daily  . bisacodyl  10 mg Oral Daily   Or  . bisacodyl  10 mg Rectal Daily  . Chlorhexidine Gluconate Cloth  6 each Topical Daily  . dapagliflozin propanediol  10  mg Oral Daily  . digoxin  0.125 mg Oral Daily  . docusate sodium  200 mg Oral Daily  . feeding supplement  237 mL Oral BID BM  . mouth rinse  15 mL Mouth Rinse BID  . multivitamin with minerals  1 tablet Oral Daily  . pantoprazole  40 mg Oral Daily  . sodium chloride flush  10-40 mL Intracatheter Q12H  . sodium chloride flush  3 mL Intravenous Q12H  . spironolactone  12.5 mg Oral Daily  . Warfarin - Pharmacist Dosing Inpatient   Does not apply q1600    Infusions: . heparin 1,150 Units/hr (11/12/20 0338)  . lactated ringers      PRN Medications: ipratropium-albuterol, lactated ringers, metoprolol tartrate, ondansetron (ZOFRAN) IV, oxyCODONE, traMADol   Assessment/Plan   1. CAD:  No chest pain, presented with dyspnea.  NSTEMI with HS-TnI elevated to peak 746.  Cath showed severe multivessel CAD with reasonable targets for CABG.  However, EF < 20%.  Cardiac MRI suggested extensive viability (except for apical septal wall and true apex).  Now s/p CABG x 4 on 5/10.  - ASA + atorvastatin 80 mg daily.  - Continue farxiga. 10 mg daily  2. Acute systolic CHF: Ischemic cardiomyopathy.  Echo this admission with EF <20%, the septum and apex were akinetic, RV function looked low normal, moderate MR.  Cardiac MRI showed EF 14% with scar in the apical septal wall and apex, mildly decreased RV systolic function. Suspect significant viability as above.  Now s/p CABG.  - Continue digoxin 0.125 daily, dig level 0.6  - Add coreg 3.125 mg twice a day.    - Increase spironolactone to 25 mg daily.   - Hold off on entresto.  - Continue Farxiga 10 mg dialy.  - Renal function stable.  - Would hope for improvement in function with revascularization.  3. LV thrombus: Small, noted on echo and cMRI.  - on heparin/warfarin per TCTS INR 1.3 4. Mitral regurgitation: Likely functional.  Moderate.  5. Hyperlipidemia: Very high LDL.   - Atorvastatin 80 daily.  - May need Repatha in future.  6. Thrombocytopenia: Post-op, improved. Resolved.   7. PVCs/NSVT K and Mag stable.  Add coreg.   We will set up follow up.  Will check price of farxiga.   Length of Stay: 15  Tonye Becket, NP  11/12/2020, 7:19 AM  Patient seen with NP, agree with the above note.   BP fluctuates a lot, 86/60 currently but SBP in 120s earlier.  Walked yesterday without difficulty.  Feels "a little foggy" today.  No dyspnea or chest pain.   General: NAD Neck: No JVD, no thyromegaly or thyroid nodule.  Lungs: Clear to auscultation bilaterally with normal respiratory effort. ZO:[id number].  Heart regular S1/S2, no S3/S4, no murmur.  No peripheral edema.   Abdomen: Soft, nontender, no hepatosplenomegaly, no distention.  Skin:  Intact without lesions or rashes.  Neurologic: Alert and oriented x 3.  Psych: Normal affect. Extremities: No clubbing or cyanosis.  HEENT: Normal.   INR 1.3 on warfarin/heparin overlap for LV thrombus.  Could potentially go home with Lovenox bridge if INR not therapeutic in next day or two.    Slow titration of cardiac meds.  Continue digoxin, Farxiga, spironolactone.  Can add Coreg as above later on if repeat BPs are higher.   Volume status looks ok.   Marca Ancona 11/12/2020 8:13 AM

## 2020-11-12 NOTE — Plan of Care (Signed)
  Problem: Health Behavior/Discharge Planning: Goal: Ability to manage health-related needs will improve Outcome: Progressing   Problem: Education: Goal: Knowledge of General Education information will improve Description: Including pain rating scale, medication(s)/side effects and non-pharmacologic comfort measures Outcome: Progressing   Problem: Clinical Measurements: Goal: Diagnostic test results will improve Outcome: Progressing   Problem: Clinical Measurements: Goal: Will remain free from infection Outcome: Progressing

## 2020-11-13 DIAGNOSIS — I5043 Acute on chronic combined systolic (congestive) and diastolic (congestive) heart failure: Secondary | ICD-10-CM | POA: Diagnosis not present

## 2020-11-13 LAB — BASIC METABOLIC PANEL
Anion gap: 8 (ref 5–15)
BUN: 14 mg/dL (ref 8–23)
CO2: 23 mmol/L (ref 22–32)
Calcium: 8.9 mg/dL (ref 8.9–10.3)
Chloride: 105 mmol/L (ref 98–111)
Creatinine, Ser: 1.15 mg/dL (ref 0.61–1.24)
GFR, Estimated: >60 mL/min (ref >60)
Glucose, Bld: 99 mg/dL (ref 70–99)
Potassium: 3.9 mmol/L (ref 3.5–5.1)
Sodium: 136 mmol/L (ref 135–145)

## 2020-11-13 LAB — CBC
HCT: 25.1 % — ABNORMAL LOW (ref 39.0–52.0)
Hemoglobin: 8.3 g/dL — ABNORMAL LOW (ref 13.0–17.0)
MCH: 32 pg (ref 26.0–34.0)
MCHC: 33.1 g/dL (ref 30.0–36.0)
MCV: 96.9 fL (ref 80.0–100.0)
Platelets: 301 10*3/uL (ref 150–400)
RBC: 2.59 MIL/uL — ABNORMAL LOW (ref 4.22–5.81)
RDW: 14.9 % (ref 11.5–15.5)
WBC: 7.7 10*3/uL (ref 4.0–10.5)
nRBC: 0 % (ref 0.0–0.2)

## 2020-11-13 LAB — PROTIME-INR
INR: 1.8 — ABNORMAL HIGH (ref 0.8–1.2)
Prothrombin Time: 20.6 seconds — ABNORMAL HIGH (ref 11.4–15.2)

## 2020-11-13 LAB — HEPARIN LEVEL (UNFRACTIONATED): Heparin Unfractionated: 0.44 IU/mL (ref 0.30–0.70)

## 2020-11-13 MED ORDER — WARFARIN SODIUM 4 MG PO TABS
4.0000 mg | ORAL_TABLET | Freq: Once | ORAL | Status: AC
  Administered 2020-11-13: 4 mg via ORAL
  Filled 2020-11-13: qty 1

## 2020-11-13 MED ORDER — MIDODRINE HCL 5 MG PO TABS
2.5000 mg | ORAL_TABLET | Freq: Three times a day (TID) | ORAL | Status: DC
  Administered 2020-11-13 – 2020-11-14 (×3): 2.5 mg via ORAL
  Filled 2020-11-13 (×3): qty 1

## 2020-11-13 NOTE — Progress Notes (Signed)
ANTICOAGULATION CONSULT NOTE - Follow Up Consult  Pharmacy Consult for Heparin + warfarin Indication: LV apical thrombus  Allergies  Allergen Reactions  . Penicillins Other (See Comments)    childhood    Patient Measurements: Height: 6\' 1"  (185.4 cm) Weight: 67 kg (147 lb 11.3 oz) IBW/kg (Calculated) : 79.9 kg Heparin Dosing Weight: 77 kg  Vital Signs: Temp: 98.4 F (36.9 C) (05/18 0844) Temp Source: Oral (05/18 0844) BP: 125/80 (05/18 0844) Pulse Rate: 92 (05/18 0844)  Labs: Recent Labs    11/11/20 0830 11/11/20 0948 11/12/20 0128 11/12/20 0935 11/13/20 0137  HGB 9.2*  --  8.5*  --  8.3*  HCT 27.1*  --  25.7*  --  25.1*  PLT 214  --  261  --  301  LABPROT  --  13.7 16.3*  --  20.6*  INR  --  1.1 1.3*  --  1.8*  HEPARINUNFRC  --   --  0.15* 0.24* 0.44  CREATININE 1.23  --  1.19  --  1.15    Estimated Creatinine Clearance: 57.5 mL/min (by C-G formula based on SCr of 1.15 mg/dL).   Medical History: Past Medical History:  Diagnosis Date  . Anemia   . Asthma    childhood  . Blood in stool   . Childhood asthma   . GI bleed   . Kidney laceration 1999  . Seizures [facility])    Assessment: 70 yo male presented with SOB and found to have mvCAD and an apical thrombus. PTA the patient is not on anticoagulation. 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 CT output down, CBC stable - pharmacy asked to start IV heparin and warfarin. Pt previously therapeutic on 1150 units/h of heparin - given recent OSH will target lower end of heparin goal and defer bolus and start drip rate lower.  Heparin level therapeutic at 0.44, INR rising to 1.8, CBC stable. Will turn down heparin drip rate as INR is rising.  Goal of Therapy:  Heparin level 0.3-0.5 units/ml Monitor platelets by anticoagulation protocol: Yes   Plan:  Warfarin 4mg  x1 tonight Reduce heparin to 1100 units/h Daily heparin level, CBC, INR   78, PharmD, BCPS, [facility] Clinical Pharmacist [phone] Please check AMION for all  [facility] Pharmacy numbers 11/13/2020

## 2020-11-13 NOTE — Plan of Care (Signed)
  Problem: Education: Goal: Knowledge of General Education information will improve Description: Including pain rating scale, medication(s)/side effects and non-pharmacologic comfort measures Outcome: Progressing   Problem: Health Behavior/Discharge Planning: Goal: Ability to manage health-related needs will improve Outcome: Progressing   Problem: Clinical Measurements: Goal: Ability to maintain clinical measurements within normal limits will improve Outcome: Progressing   Problem: Clinical Measurements: Goal: Diagnostic test results will improve Outcome: Progressing   Problem: Activity: Goal: Risk for activity intolerance will decrease Outcome: Progressing   Problem: Nutrition: Goal: Adequate nutrition will be maintained Outcome: Progressing   Problem: Pain Managment: Goal: General experience of comfort will improve Outcome: Progressing   Problem: Education: Goal: Ability to demonstrate management of disease process will improve Outcome: Progressing   Problem: Education: Goal: Ability to verbalize understanding of medication therapies will improve Outcome: Progressing   Problem: Activity: Goal: Capacity to carry out activities will improve Outcome: Progressing   Problem: Education: Goal: Will demonstrate proper wound care and an understanding of methods to prevent future damage Outcome: Progressing   Problem: Education: Goal: Knowledge of disease or condition will improve Outcome: Progressing   Problem: Education: Goal: Knowledge of the prescribed therapeutic regimen will improve Outcome: Progressing   Problem: Clinical Measurements: Goal: Postoperative complications will be avoided or minimized Outcome: Progressing   Problem: Skin Integrity: Goal: Wound healing without signs and symptoms of infection Outcome: Progressing

## 2020-11-13 NOTE — TOC Progression Note (Signed)
Transition of Care (TOC) - Progression Note  Heart Failure   Patient Details  Name: Troy Johnson MRN: [id number] Date of Birth: 06-Mar-1951  Transition of Care [facility]) CM/SW Contact  Rabiah Goeser, LCSWA Phone Number: 11/13/2020, 4:46 PM  Clinical Narrative:    CSW spoke with patient at bedside to bring them an appointment card for the [facility] outpatient clinic and encouraged them to follow up and to attend the appointment and bring their medications and if anything changes to please reach out so that CSW/HV clinic team can provide support. The patient asked the CSW about getting a 3 in 1 and CSW saw that an order was placed for DME and 3 in 1 will be delivered to the room before the patient discharges.   TOC will continue to follow through discharge.     Barriers to Discharge: Continued Medical Work up  Expected Discharge Plan and Services   In-house Referral: Clinical Social Work                                             Social Determinants of Health (SDOH) Interventions Food Insecurity Interventions: Intervention Not Indicated Financial Strain Interventions: Intervention Not Indicated Housing Interventions: Intervention Not Indicated Transportation Interventions: Intervention Not Indicated  Readmission Risk Interventions No flowsheet data found.  Rainen Vanrossum, MSW, LCSWA [phone] Heart Failure Social Worker

## 2020-11-13 NOTE — Progress Notes (Addendum)
      [address]       [name] [address]             [phone]        8 Days Post-Op Procedure(s) (LRB): CORONARY ARTERY BYPASS GRAFTING (CABG) (N/A) TRANSESOPHAGEAL ECHOCARDIOGRAM (TEE) (N/A)  Subjective: Patient hopes to go home. He has no specific complaint this am  Objective: Vital signs in last 24 hours: Temp:  [97.7 F (36.5 C)-99 F (37.2 C)] 98.4 F (36.9 C) (05/18 0300) Pulse Rate:  [100-113] 105 (05/18 0609) Cardiac Rhythm: Sinus tachycardia (05/18 0300) Resp:  [15-22] 15 (05/18 0300) BP: (85-135)/(64-92) 116/73 (05/18 0609) SpO2:  [95 %-100 %] 97 % (05/18 0300) Weight:  [67 kg] 67 kg (05/18 0300)  Pre op weight 67.7 kg Current Weight  11/13/20 67 kg      Intake/Output from previous day: 05/17 0701 - 05/18 0700 In: 514 [P.O.:240; I.V.:274] Out: 600 [Urine:600]   Physical Exam:  Cardiovascular: RRR Pulmonary: Clear to auscultation bilaterally Abdomen: Soft, non tender, bowel sounds present. Extremities:No lower extremity edema. Wounds: Clean and dry.  No erythema or signs of infection.  Lab Results: CBC: Recent Labs    11/12/20 0128 11/13/20 0137  WBC 6.7 7.7  HGB 8.5* 8.3*  HCT 25.7* 25.1*  PLT 261 301   BMET:  Recent Labs    11/12/20 0128 11/13/20 0137  NA 135 136  K 4.3 3.9  CL 102 105  CO2 24 23  GLUCOSE 95 99  BUN 14 14  CREATININE 1.19 1.15  CALCIUM 9.0 8.9    PT/INR:  Lab Results  Component Value Date   INR 1.8 (H) 11/13/2020   INR 1.3 (H) 11/12/2020   INR 1.1 11/11/2020   ABG:  INR: Will add last result for INR, ABG once components are confirmed Will add last 4 CBG results once components are confirmed  Assessment/Plan:  1. CV - S/p NSTEMI. ST at times.. LV thrombus so on Heparin and Coumadin;pharmacy managing. INR this am increased from 1.3 to 1.8. On Coreg 3.125 mg bid, Midodrine 5 mg tid, and Digoxin 0.125 mg daily. 2.  Pulmonary - On room air. Encourage incentive spirometer. 3. Acute  systolic CHF-on Farxiga 10 mg daily and Spirinolactone 25 mg daily. Heart failure following. 4.  Expected post op acute blood loss anemia - H and H this am stable at  8.3 and 25.1 5. As per heart failure note, hope to discharge in am when INR closer to 2  Donielle M ZimmermanPA-C 11/13/2020,7:04 AM    Agree with above. Currently doing well. Awaiting therapeutic INR.  Kaedance Magos Keane Scrape

## 2020-11-13 NOTE — Progress Notes (Signed)
Physical Therapy Treatment Patient Details Name: Troy Johnson MRN: [id number] DOB: 02-24-1951 Today's Date: 11/13/2020    History of Present Illness 70 yo admitted 5/2 with SOB with acute CHF and NSTEMI. Cath 5/3. CABG x 4 5/10. PMhx: HTN, asthma, seizure    PT Comments    Pt demonstrates ability to perform bed mobility, transfers, and ambulation without the need for physical assistance. Pt able to verbalize sternal precautions, but requires cueing intermittently during session to maintain precautions. Pt tolerates ambulation for increased distances and without the use of an AD, compared to last session. Pt reports feelings of instability compared to baseline. Pt requests to attempt stair training at another time, will benefit at next session. Pt demonstrates limitations in balance and activity tolerance secondary to fatigue and will benefit from acute PT to address stair training at next session.   Follow Up Recommendations  No PT follow up;Supervision - Intermittent     Equipment Recommendations  None recommended by PT    Recommendations for Other Services       Precautions / Restrictions Precautions Precautions: Sternal Precaution Comments: pt able to state precautions Restrictions Weight Bearing Restrictions: Yes (sternal precautions)    Mobility  Bed Mobility Overal bed mobility: Modified Independent Bed Mobility: Rolling;Sidelying to Sit Rolling: Independent Sidelying to sit: Modified independent (Device/Increase time);HOB elevated            Transfers Overall transfer level: Independent Equipment used: None Transfers: Sit to/from Stand Sit to Stand: Independent         General transfer comment: Pt demonstrates knowledge of sternal precautions, maintaining precautions during transfers.  Ambulation/Gait Ambulation/Gait assistance: Supervision Gait Distance (Feet): 380 Feet Assistive device: None Gait Pattern/deviations: Step-through pattern;Decreased  stride length Gait velocity: decreased Gait velocity interpretation: >2.62 ft/sec, indicative of community ambulatory General Gait Details: Pt reports gait to feel wobbly compared to baseline, but improving. Pt agrees to ambulate for longer distances during session today. Pt denies lightheadedness/dizziness at this time.   Stairs             Wheelchair Mobility    Modified Rankin (Stroke Patients Only)       Balance Overall balance assessment: Needs assistance Sitting-balance support: Feet supported;No upper extremity supported Sitting balance-Leahy Scale: Good     Standing balance support: During functional activity;No upper extremity supported Standing balance-Leahy Scale: Good Standing balance comment: Pt does not require UE support with static standing and gait. Pt reports feeling slightly wobbly compared to baseline.                            Cognition Arousal/Alertness: Awake/alert Behavior During Therapy: WFL for tasks assessed/performed Overall Cognitive Status: Within Functional Limits for tasks assessed                                        Exercises      General Comments General comments (skin integrity, edema, etc.): BP taken prior to gait activities in seated, 93/72.      Pertinent Vitals/Pain Pain Assessment: No/denies pain    Home Living                      Prior Function            PT Goals (current goals can now be found in the care plan section) Acute Rehab PT Goals  Patient Stated Goal: return home Progress towards PT goals: Progressing toward goals    Frequency    Min 3X/week      PT Plan Current plan remains appropriate    Co-evaluation              AM-PAC PT "6 Clicks" Mobility   Outcome Measure  Help needed turning from your back to your side while in a flat bed without using bedrails?: None Help needed moving from lying on your back to sitting on the side of a flat bed  without using bedrails?: None Help needed moving to and from a bed to a chair (including a wheelchair)?: None Help needed standing up from a chair using your arms (e.g., wheelchair or bedside chair)?: None Help needed to walk in hospital room?: A Little Help needed climbing 3-5 steps with a railing? : A Little 6 Click Score: 22    End of Session Equipment Utilized During Treatment: Gait belt Activity Tolerance: Patient tolerated treatment well Patient left: in chair;with call bell/phone within reach Nurse Communication: Mobility status PT Visit Diagnosis: Other abnormalities of gait and mobility (R26.89)     Time: 4174-0814 PT Time Calculation (min) (ACUTE ONLY): 17 min  Charges:  $Therapeutic Activity: 8-22 mins                     Acute Rehab  Pager: [phone]    Waldemar Dickens, SPT  11/13/2020, 2:01 PM

## 2020-11-13 NOTE — Progress Notes (Signed)
Patient ID: Troy Johnson, male   DOB: 1950/10/12, 70 y.o.   MRN: [id number]     Advanced Heart Failure Rounding Note  PCP-Cardiologist: None   Subjective:    CABG (5/10): LIMA-LAD, SVG-D2, SVG-OM1, SVG-PDA.   BP better today on midodrine 5 mg tid.  Felt good with walk.    Objective:   Weight Range: 67 kg Body mass index is 19.49 kg/m.   Vital Signs:   Temp:  [98 F (36.7 C)-99 F (37.2 C)] 98.4 F (36.9 C) (05/18 0844) Pulse Rate:  [92-113] 92 (05/18 0844) Resp:  [15-22] 18 (05/18 0844) BP: (99-135)/(64-92) 125/80 (05/18 0844) SpO2:  [95 %-100 %] 97 % (05/18 0844) Weight:  [67 kg] 67 kg (05/18 0300) Last BM Date: 11/12/20  Weight change: Filed Weights   11/11/20 0439 11/12/20 0300 11/13/20 0300  Weight: 65.9 kg 69.5 kg 67 kg    Intake/Output:   Intake/Output Summary (Last 24 hours) at 11/13/2020 0900 Last data filed at 11/13/2020 0300 Gross per 24 hour  Intake 394.01 ml  Output --  Net 394.01 ml      Physical Exam  General: NAD Neck: No JVD, no thyromegaly or thyroid nodule.  Lungs: Clear to auscultation bilaterally with normal respiratory effort. CV: Nondisplaced PMI.  Heart regular S1/S2, no S3/S4, no murmur.  No peripheral edema.  Abdomen: Soft, nontender, no hepatosplenomegaly, no distention.  Skin: Intact without lesions or rashes.  Neurologic: Alert and oriented x 3.  Psych: Normal affect. Extremities: No clubbing or cyanosis.  HEENT: Normal.    Telemetry  SR-ST with PVCs. 90-100s. Personally reviewed  Labs    CBC Recent Labs    11/12/20 0128 11/13/20 0137  WBC 6.7 7.7  HGB 8.5* 8.3*  HCT 25.7* 25.1*  MCV 97.3 96.9  PLT 261 301   Basic Metabolic Panel Recent Labs    83/38/25 0830 11/12/20 0128 11/13/20 0137  NA 136 135 136  K 4.2 4.3 3.9  CL 103 102 105  CO2 21* 24 23  GLUCOSE 140* 95 99  BUN 12 14 14   CREATININE 1.23 1.19 1.15  CALCIUM 9.2 9.0 8.9  MG 1.8 2.0  --    Liver Function Tests No results for input(s): AST,  ALT, ALKPHOS, BILITOT, PROT, ALBUMIN in the last 72 hours. No results for input(s): LIPASE, AMYLASE in the last 72 hours. Cardiac Enzymes No results for input(s): CKTOTAL, CKMB, CKMBINDEX, TROPONINI in the last 72 hours.  BNP: BNP (last 3 results) Recent Labs    10/28/20 1433 11/02/20 0205  BNP 1,827.5* 1,261.6*    ProBNP (last 3 results) No results for input(s): PROBNP in the last 8760 hours.   D-Dimer No results for input(s): DDIMER in the last 72 hours. Hemoglobin A1C No results for input(s): HGBA1C in the last 72 hours. Fasting Lipid Panel No results for input(s): CHOL, HDL, LDLCALC, TRIG, CHOLHDL, LDLDIRECT in the last 72 hours. Thyroid Function Tests No results for input(s): TSH, T4TOTAL, T3FREE, THYROIDAB in the last 72 hours.  Invalid input(s): FREET3  Other results:   Imaging    No results found.   Medications:     Scheduled Medications: . aspirin EC  81 mg Oral Daily   Or  . aspirin  81 mg Per Tube Daily  . atorvastatin  80 mg Oral Daily  . bisacodyl  10 mg Oral Daily   Or  . bisacodyl  10 mg Rectal Daily  . carvedilol  3.125 mg Oral BID WC  . Chlorhexidine Gluconate Cloth  6 each Topical Daily  . dapagliflozin propanediol  10 mg Oral Daily  . digoxin  0.125 mg Oral Daily  . docusate sodium  200 mg Oral Daily  . feeding supplement  237 mL Oral BID BM  . mouth rinse  15 mL Mouth Rinse BID  . midodrine  2.5 mg Oral TID WC  . multivitamin with minerals  1 tablet Oral Daily  . pantoprazole  40 mg Oral Daily  . sodium chloride flush  10-40 mL Intracatheter Q12H  . sodium chloride flush  3 mL Intravenous Q12H  . spironolactone  25 mg Oral Daily  . Warfarin - Pharmacist Dosing Inpatient   Does not apply q1600    Infusions: . heparin 1,200 Units/hr (11/13/20 0338)  . lactated ringers      PRN Medications: ipratropium-albuterol, lactated ringers, metoprolol tartrate, ondansetron (ZOFRAN) IV, oxyCODONE, traMADol   Assessment/Plan   1. CAD:  No chest pain, presented with dyspnea.  NSTEMI with HS-TnI elevated to peak 746.  Cath showed severe multivessel CAD with reasonable targets for CABG.  However, EF < 20%.  Cardiac MRI suggested extensive viability (except for apical septal wall and true apex).  Now s/p CABG x 4 on 5/10.  - ASA + atorvastatin 80 mg daily.  2. Acute systolic CHF: Ischemic cardiomyopathy.  Echo this admission with EF <20%, the septum and apex were akinetic, RV function looked low normal, moderate MR.  Cardiac MRI showed EF 14% with scar in the apical septal wall and apex, mildly decreased RV systolic function. Suspect significant viability as above.  Now s/p CABG. No dyspnea, euvolemic on exam.  - Continue digoxin 0.125 daily. - Continue Coreg 3.125 mg bid.     - Continue spironolactone 25 mg daily.   - Hold off on entresto, BP has been low.  - Continue Farxiga 10 mg daily.  - Back off on midodrine to 2.5 mg tid, hopefully can eventually stop. - Would hope for improvement in function with revascularization, will repeat echo at 3 months to determine need for ICD.  3. LV thrombus: Small, noted on echo and cMRI. INR 1.8 on warfarin/heparin.  - Stop heparin gtt when INR up to 2.  4. Mitral regurgitation: Likely functional.  Moderate.  5. Hyperlipidemia: Very high LDL.   - Atorvastatin 80 daily.  - May need Repatha in future.  6. Thrombocytopenia: Post-op, improved. Resolved.   7. PVCs/NSVT: Coreg.   Probably home tomorrow morning when INR up to 2. Will need CHF clinic and coumadin clinic followup.   Marca Ancona 11/13/2020 9:00 AM

## 2020-11-13 NOTE — Discharge Instructions (Addendum)
Discharge Instructions:  1. You may shower, please wash incisions daily with soap and water and keep dry.  If you wish to cover wounds with dressing you may do so but please keep clean and change daily.  No tub baths or swimming until incisions have completely healed.  If your incisions become red or develop any drainage please call our office at [phone]  2. No Driving until cleared by Dr. Lucilla Lame office and you are no longer using narcotic pain medications  3. Monitor your weight daily.. Please use the same scale and weigh at same time... If you gain 5-10 lbs in 48 hours with associated lower extremity swelling, please contact our office at [phone]  4. Fever of 101.5 for at least 24 hours with no source, please contact our office at [phone]  5. Activity- up as tolerated, please walk at least 3 times per day.  Avoid strenuous activity, no lifting, pushing, or pulling with your arms over 8-10 lbs for a minimum of 6 weeks  6. If any questions or concerns arise, please do not hesitate to contact our office at [phone]   Low Sodium Nutrition Therapy  Eating less sodium can help you if you have high blood pressure, heart failure, or kidney or liver disease.   Your body needs a little sodium, but too much sodium can cause your body to hold onto extra water. This extra water will raise your blood pressure and can cause damage to your heart, kidneys, or liver as they are forced to work harder.   Sometimes you can see how the extra fluid affects you because your hands, legs, or belly swell. You may also hold water around your heart and lungs, which makes it hard to breathe.   Even if you take medication for blood pressure or a water pill (diuretic) to remove fluid, it is still important to have less salt in your diet.   Check with your primary care provider before drinking alcohol since it may affect the amount of fluid in your body and how your heart, kidneys, or liver  work. Sodium in Food A low-sodium meal plan limits the sodium that you get from food and beverages to 1,500-2,000 milligrams (mg) per day. Salt is the main source of sodium. Read the nutrition label on the package to find out how much sodium is in one serving of a food.  . Select foods with 140 milligrams (mg) of sodium or less per serving.  . You may be able to eat one or two servings of foods with a little more than 140 milligrams (mg) of sodium if you are closely watching how much sodium you eat in a day.  . Check the serving size on the label. The amount of sodium listed on the label shows the amount in one serving of the food. So, if you eat more than one serving, you will get more sodium than the amount listed.  Tips Cutting Back on Sodium . Eat more fresh foods.  . Fresh fruits and vegetables are low in sodium, as well as frozen vegetables and fruits that have no added juices or sauces.  . Fresh meats are lower in sodium than processed meats, such as bacon, sausage, and hotdogs.  . Not all processed foods are unhealthy, but some processed foods may have too much sodium.  . Eat less salt at the table and when cooking. One of the ingredients in salt is sodium.  . One teaspoon of table salt has 2,300 milligrams  of sodium.  . Leave the salt out of recipes for pasta, casseroles, and soups. . Be a smart shopper.  . Food packages that say "Salt-free", sodium-free", "very low sodium," and "low sodium" have less than 140 milligrams of sodium per serving.  . Beware of products identified as "Unsalted," "No Salt Added," "Reduced Sodium," or "Lower Sodium." These items may still be high in sodium. You should always check the nutrition label. . Add flavors to your food without adding sodium.  . Try lemon juice, lime juice, or vinegar.  . Dry or fresh herbs add flavor.  Arnoldo Morale. Buy a sodium-free seasoning blend or make your own at home. . You can purchase salt-free or sodium-free condiments like barbeque  sauce in stores and online. Ask your registered dietitian nutritionist for recommendations and where to find them.  .  Eating in Restaurants . Choose foods carefully when you eat outside your home. Restaurant foods can be very high in sodium. Many restaurants provide nutrition facts on their menus or their websites. If you cannot find that information, ask your server. Let your server know that you want your food to be cooked without salt and that you would like your salad dressing and sauces to be served on the side.  .   . Foods Recommended . Food Group . Foods Recommended  . Grains . Bread, bagels, rolls without salted tops Homemade bread made with reduced-sodium baking powder Cold cereals, especially shredded wheat and puffed rice Oats, grits, or cream of wheat Pastas, quinoa, and rice Popcorn, pretzels or crackers without salt Corn tortillas  . Protein Foods . Fresh meats and fish; [place] bacon (check the nutrition labels - make sure they are not packaged in a sodium solution) Canned or packed tuna (no more than 4 ounces at 1 serving) Beans and peas Soybeans) and tofu Eggs Nuts or nut butters without salt  . Dairy . Milk or milk powder Plant milks, such as rice and soy Yogurt, including Greek yogurt Small amounts of natural cheese (blocks of cheese) or reduced-sodium cheese can be used in moderation. (Swiss, ricotta, and fresh mozzarella cheese are lower in sodium than the others) Cream Cheese Low sodium cottage cheese  . Vegetables . Fresh and frozen vegetables without added sauces or salt Homemade soups (without salt) Low-sodium, salt-free or sodium-free canned vegetables and soups  . Fruit . Fresh and canned fruits Dried fruits, such as raisins, cranberries, and prunes  . Oils . Tub or liquid margarine, regular or without salt Canola, corn, peanut, olive, safflower, or sunflower oils  . Condiments . Fresh or dried herbs such as basil, bay leaf, dill, mustard (dry), nutmeg,  paprika, parsley, rosemary, sage, or thyme.  Low sodium ketchup Vinegar  Lemon or lime juice Pepper, red pepper flakes, and cayenne. Hot sauce contains sodium, but if you use just a drop or two, it will not add up to much.  Salt-free or sodium-free seasoning mixes and marinades Simple salad dressings: vinegar and oil  .  Marland Kitchen. Foods Not Recommended . Food Group . Foods Not Recommended  . Grains . Breads or crackers topped with salt Cereals (hot/cold) with more than 300 mg sodium per serving Biscuits, cornbread, and other "quick" breads prepared with baking soda Pre-packaged bread crumbs Seasoned and packaged rice and pasta mixes Self-rising flours  . Protein Foods . Cured meats: Bacon, ham, sausage, pepperoni and hot dogs Canned meats (chili, vienna sausage, or sardines) Smoked fish and meats Frozen meals that have more than 600 mg  of sodium per serving Egg substitute (with added sodium)  . Dairy . Buttermilk Processed cheese spreads Cottage cheese (1 cup may have over 500 mg of sodium; look for low-sodium.) American or feta cheese Shredded Cheese has more sodium than blocks of cheese String cheese  . Vegetables . Canned vegetables (unless they are salt-free, sodium-free or low sodium) Frozen vegetables with seasoning and sauces Sauerkraut and pickled vegetables Canned or dried soups (unless they are salt-free, sodium-free, or low sodium) [place] fries and onion rings  . Fruit . Dried fruits preserved with additives that have sodium  . Oils . Salted butter or margarine, all types of olives  . Condiments . Salt, sea salt, kosher salt, onion salt, and garlic salt Seasoning mixes with salt Bouillon cubes Ketchup Barbeque sauce and Worcestershire sauce unless low sodium Soy sauce Salsa, pickles, olives, relish Salad dressings: ranch, blue cheese, [place], and [place].  .  . Low Sodium Sample 1-Day Menu  . Breakfast . 1 cup cooked oatmeal  . 1 slice whole wheat bread toast  . 1  tablespoon peanut butter without salt  . 1 banana  . 1 cup 1% milk  . Lunch . Tacos made with: 2 corn tortillas  .  cup black beans, low sodium  .  cup roasted or grilled chicken (without skin)  .  avocado  . Squeeze of lime juice  . 1 cup salad greens  . 1 tablespoon low-sodium salad dressing  .  cup strawberries  . 1 orange  . Afternoon Snack . 1/3 cup grapes  . 6 ounces yogurt  . Evening Meal . 3 ounces herb-baked fish  . 1 baked potato  . 2 teaspoons olive oil  .  cup cooked carrots  . 2 thick slices tomatoes on:  . 2 lettuce leaves  . 1 teaspoon olive oil  . 1 teaspoon balsamic vinegar  . 1 cup 1% milk  . Evening Snack . 1 apple  .  cup almonds without salt  .  Marland Kitchen Low-Sodium Vegetarian (Lacto-Ovo) Sample 1-Day Menu  . Breakfast . 1 cup cooked oatmeal  . 1 slice whole wheat toast  . 1 tablespoon peanut butter without salt  . 1 banana  . 1 cup 1% milk  . Lunch . Tacos made with: 2 corn tortillas  .  cup black beans, low sodium  .  cup roasted or grilled chicken (without skin)  .  avocado  . Squeeze of lime juice  . 1 cup salad greens  . 1 tablespoon low-sodium salad dressing  .  cup strawberries  . 1 orange  . Evening Meal . Stir fry made with:  cup tofu  . 1 cup brown rice  .  cup broccoli  .  cup green beans  .  cup peppers  .  tablespoon peanut oil  . 1 orange  . 1 cup 1% milk  . Evening Snack . 4 strips celery  . 2 tablespoons hummus  . 1 hard-boiled egg  .  Marland Kitchen Low-Sodium Vegan Sample 1-Day Menu  . Breakfast . 1 cup cooked oatmeal  . 1 tablespoon peanut butter without salt  . 1 cup blueberries  . 1 cup soymilk fortified with calcium, vitamin B12, and vitamin D  . Lunch . 1 small whole wheat pita  .  cup cooked lentils  . 2 tablespoons hummus  . 4 carrot sticks  . 1 medium apple  . 1 cup soymilk fortified with calcium, vitamin B12, and vitamin D  .  Evening Meal . Stir fry made with:  cup tofu  . 1 cup brown rice  .  cup broccoli   .  cup green beans  .  cup peppers  .  tablespoon peanut oil  . 1 cup cantaloupe  . Evening Snack . 1 cup soy yogurt  .  cup mixed nuts  . Copyright 2020  Academy of Nutrition and Dietetics. All rights reserved .  Marland Kitchen Sodium Free Flavoring Tips .  Marland Kitchen When cooking, the following items may be used for flavoring instead of salt or seasonings that contain sodium. . Remember: A little bit of spice goes a long way! Be careful not to overseason. Marland Kitchen Spice Blend Recipe (makes about ? cup) . 5 teaspoons onion powder  . 2 teaspoons garlic powder  . 2 teaspoons paprika  . 2 teaspoon dry mustard  . 1 teaspoon crushed thyme leaves  .  teaspoon white pepper  .  teaspoon celery seed Food Item Flavorings  Beef Basil, bay leaf, caraway, curry, dill, dry mustard, garlic, grape jelly, green pepper, mace, marjoram, mushrooms (fresh), nutmeg, onion or onion powder, parsley, pepper, rosemary, sage  Chicken Basil, cloves, cranberries, mace, mushrooms (fresh), nutmeg, oregano, paprika, parsley, pineapple, saffron, sage, savory, tarragon, thyme, tomato, turmeric  Egg Chervil, curry, dill, dry mustard, garlic or garlic powder, green pepper, jelly, mushrooms (fresh), nutmeg, onion powder, paprika, parsley, rosemary, tarragon, tomato  Fish Basil, bay leaf, chervil, curry, dill, dry mustard, green pepper, lemon juice, marjoram, mushrooms (fresh), paprika, pepper, tarragon, tomato, turmeric  Lamb Cloves, curry, dill, garlic or garlic powder, mace, mint, mint jelly, onion, oregano, parsley, pineapple, rosemary, tarragon, thyme  Pork Applesauce, basil, caraway, chives, cloves, garlic or garlic powder, onion or onion powder, rosemary, thyme  Veal Apricots, basil, bay leaf, currant jelly, curry, ginger, marjoram, mushrooms (fresh), oregano, paprika  Vegetables Basil, dill, garlic or garlic powder, ginger, lemon juice, mace, marjoram, nutmeg, onion or onion powder, tarragon, tomato, sugar or sugar substitute,  salt-free salad dressing, vinegar  Desserts Allspice, anise, cinnamon, cloves, ginger, mace, nutmeg, vanilla extract, other extracts   Copyright 2020  Academy of Nutrition and Dietetics. All rights reserved  Fluid Restricted Nutrition Therapy  You have been prescribed this diet because your condition affects how much fluid you can eat or drink. If your heart, liver, or kidneys aren't working properly, you may not be able to effectively eliminate fluids from the body and this may cause swelling (edema) in the legs, arms, and/or stomach. Drink no more than _________ liters or ________ ounces or ________cups of fluid per day.  . You don't need to stop eating or drinking the same fluids you normally would, but you may need to eat or drink less than usual.  . Your registered dietitian nutritionist will help you determine the correct amount of fluid to consume during the day Breakfast Include fluids taken with medications  Lunch Include fluids taken with medications  Dinner Include fluids taken with medications  Bedtime Snack Include fluids taken with medications     Tips What Are Fluids?  A fluid is anything that is liquid or anything that would melt if left at room temperature. You will need to count these foods and liquids--including any liquid used to take medication--as part of your daily fluid intake. Some examples are: . Alcohol (drink only with your doctor's permission)  . Coffee, tea, and other hot beverages  . Gelatin (Jell-O)  . Gravy  . Ice cream, sherbet, sorbet  . Ice cubes, ice  chips  . Milk, liquid creamer  . Nutritional supplements  . Popsicles  . Vegetable and fruit juices; fluid in canned fruit  . Watermelon  . Yogurt  . Soft drinks, lemonade, limeade  . Soups  . Syrup How Do I Measure My Fluid Intake? Marland Kitchen Record your fluid intake daily.  . Tip: Every day, each time you eat or drink fluids, pour water in the same amount into an empty container that can hold the same  amount of fluids you are allowed daily. This may help you keep track of how much fluid you are taking in throughout the day.  . To accurately keep track of how much liquid you take in, measure the size of the cups, glasses, and bowls you use. If you eat soup, measure how much of it is liquid and how much is solid (such as noodles, vegetables, meat). Conversions for Measuring Fluid Intake  Milliliters (mL) Liters (L) Ounces (oz) Cups (c)  1000 1 32 4  1200 1.2 40 5  1500 1.5 50 6 1/4  1800 1.8 60 7 1/2  2000 2 67 8 1/3  Tips to Reduce Your Thirst . Chew gum or suck on hard candy.  . Rinse or gargle with mouthwash. Do not swallow.  . Ice chips or popsicles my help quench thirst, but this too needs to be calculated into the total restriction. Melt ice chips or cubes first to figure out how much fluid they produce (for example, experiment with melting  cup ice chips or 2 ice cubes).  . Add a lemon wedge to your water.  . Limit how much salt you take in. A high salt intake might make you thirstier.  . Don't eat or drink all your allowed liquids at once. Space your liquids out through the day.  . Use small glasses and cups and sip slowly. If allowed, take your medications with fluids you eat or drink during a meal.   Fluid-Restricted Nutrition Therapy Sample 1-Day Menu  Breakfast 1 slice wheat toast  1 tablespoon peanut butter  1/2 cup yogurt (120 milliliters)  1/2 cup blueberries  1 cup milk (240 milliliters)   Lunch 3 ounces sliced [place]  2 slices whole wheat bread  1/2 cup lettuce for sandwich  2 slices tomato for sandwich  1 ounce reduced-fat, reduced-sodium cheese  1/2 cup fresh carrot sticks  1 banana  1 cup unsweetened tea (240 milliliters)   Evening Meal 8 ounces soup (240 milliliters)  3 ounces salmon  1/2 cup quinoa  1 cup green beans  1 cup mixed greens salad  1 tablespoon olive oil  1 cup coffee (240 milliliters)  Evening Snack 1/2 cup sliced peaches  1/2 cup frozen  yogurt (120 milliliters)  1 cup water (240 milliliters)  Copyright 2020  Academy of Nutrition and Dietetics. All rights reserved   Information on my medicine - Coumadin   (Warfarin)  This medication education was reviewed with me or my healthcare representative as part of my discharge preparation.  The pharmacist that spoke with me during my hospital stay was:  Mosetta Anis, [facility]  Why was Coumadin prescribed for you? Coumadin was prescribed for you because you have a blood clot or a medical condition that can cause an increased risk of forming blood clots. Blood clots can cause serious health problems by blocking the flow of blood to the heart, lung, or brain. Coumadin can prevent harmful blood clots from forming. As a reminder your indication for Coumadin  is:   Blood Clotting Disorder  What test will check on my response to Coumadin? While on Coumadin (warfarin) you will need to have an INR test regularly to ensure that your dose is keeping you in the desired range. The INR (international normalized ratio) number is calculated from the result of the laboratory test called prothrombin time (PT).  If an INR APPOINTMENT HAS NOT ALREADY BEEN MADE FOR YOU please schedule an appointment to have this lab work done by your health care provider within 7 days. Your INR goal is usually a number between:  2 to 3 or your provider may give you a more narrow range like 2-2.5.  Ask your health care provider during an office visit what your goal INR is.  What  do you need to  know  About  COUMADIN? Take Coumadin (warfarin) exactly as prescribed by your healthcare provider about the same time each day.  DO NOT stop taking without talking to the doctor who prescribed the medication.  Stopping without other blood clot prevention medication to take the place of Coumadin may increase your risk of developing a new clot or stroke.  Get refills before you run out.  What do you do if you miss a dose? If you miss  a dose, take it as soon as you remember on the same day then continue your regularly scheduled regimen the next day.  Do not take two doses of Coumadin at the same time.  Important Safety Information A possible side effect of Coumadin (Warfarin) is an increased risk of bleeding. You should call your healthcare provider right away if you experience any of the following: ? Bleeding from an injury or your nose that does not stop. ? Unusual colored urine (red or dark brown) or unusual colored stools (red or black). ? Unusual bruising for unknown reasons. ? A serious fall or if you hit your head (even if there is no bleeding).  Some foods or medicines interact with Coumadin (warfarin) and might alter your response to warfarin. To help avoid this: ? Eat a balanced diet, maintaining a consistent amount of Vitamin K. ? Notify your provider about major diet changes you plan to make. ? Avoid alcohol or limit your intake to 1 drink for women and 2 drinks for men per day. (1 drink is 5 oz. wine, 12 oz. beer, or 1.5 oz. liquor.)  Make sure that ANY health care provider who prescribes medication for you knows that you are taking Coumadin (warfarin).  Also make sure the healthcare provider who is monitoring your Coumadin knows when you have started a new medication including herbals and non-prescription products.  Coumadin (Warfarin)  Major Drug Interactions  Increased Warfarin Effect Decreased Warfarin Effect  Alcohol (large quantities) Antibiotics (esp. Septra/Bactrim, Flagyl, Cipro) Amiodarone (Cordarone) Aspirin (ASA) Cimetidine (Tagamet) Megestrol (Megace) NSAIDs (ibuprofen, naproxen, etc.) Piroxicam (Feldene) Propafenone (Rythmol SR) Propranolol (Inderal) Isoniazid (INH) Posaconazole (Noxafil) Barbiturates (Phenobarbital) Carbamazepine (Tegretol) Chlordiazepoxide (Librium) Cholestyramine (Questran) Griseofulvin Oral Contraceptives Rifampin Sucralfate (Carafate) Vitamin K   Coumadin  (Warfarin) Major Herbal Interactions  Increased Warfarin Effect Decreased Warfarin Effect  Garlic Ginseng Ginkgo biloba Coenzyme Q10 Green tea St. John's wort    Coumadin (Warfarin) FOOD Interactions  Eat a consistent number of servings per week of foods HIGH in Vitamin K (1 serving =  cup)  Collards (cooked, or boiled & drained) Kale (cooked, or boiled & drained) Mustard greens (cooked, or boiled & drained) Parsley *serving size only =  cup Spinach (cooked, or  boiled & drained) Swiss chard (cooked, or boiled & drained) Turnip greens (cooked, or boiled & drained)  Eat a consistent number of servings per week of foods MEDIUM-HIGH in Vitamin K (1 serving = 1 cup)  Asparagus (cooked, or boiled & drained) Broccoli (cooked, boiled & drained, or raw & chopped) Brussel sprouts (cooked, or boiled & drained) *serving size only =  cup Lettuce, raw (green leaf, endive, romaine) Spinach, raw Turnip greens, raw & chopped   These websites have more information on Coumadin (warfarin):  http://www.king-russell.com/; https://www.hines.net/;

## 2020-11-13 NOTE — Progress Notes (Signed)
Mobility Specialist - Progress Note   11/13/20 1449  Mobility  Activity Ambulated in hall  Level of Assistance Standby assist, set-up cues, supervision of patient - no hands on  Assistive Device None  Distance Ambulated (ft) 150 ft  Mobility Ambulated with assistance in hallway  Mobility Response Tolerated well  Mobility performed by Mobility specialist  $Mobility charge 1 Mobility   Pre-mobility: 100 HR, 132/102 BP Post-mobility: 102 HR, 99/65 BP  Pt asx throughout ambulation. First BP was taken w/ a regular sized cuff, second one taken w/ small cuff. Pt sitting up on edge of bed after walk, family member in room.   Troy Johnson Mobility Specialist Mobility Specialist Phone: [phone]

## 2020-11-14 ENCOUNTER — Other Ambulatory Visit (HOSPITAL_COMMUNITY): Payer: Self-pay

## 2020-11-14 LAB — CBC
HCT: 26.5 % — ABNORMAL LOW (ref 39.0–52.0)
Hemoglobin: 8.8 g/dL — ABNORMAL LOW (ref 13.0–17.0)
MCH: 32.4 pg (ref 26.0–34.0)
MCHC: 33.2 g/dL (ref 30.0–36.0)
MCV: 97.4 fL (ref 80.0–100.0)
Platelets: 363 10*3/uL (ref 150–400)
RBC: 2.72 MIL/uL — ABNORMAL LOW (ref 4.22–5.81)
RDW: 15.1 % (ref 11.5–15.5)
WBC: 8.7 10*3/uL (ref 4.0–10.5)
nRBC: 0 % (ref 0.0–0.2)

## 2020-11-14 LAB — BASIC METABOLIC PANEL
Anion gap: 9 (ref 5–15)
BUN: 17 mg/dL (ref 8–23)
CO2: 23 mmol/L (ref 22–32)
Calcium: 9.1 mg/dL (ref 8.9–10.3)
Chloride: 103 mmol/L (ref 98–111)
Creatinine, Ser: 1.15 mg/dL (ref 0.61–1.24)
GFR, Estimated: >60 mL/min (ref >60)
Glucose, Bld: 100 mg/dL — ABNORMAL HIGH (ref 70–99)
Potassium: 4.1 mmol/L (ref 3.5–5.1)
Sodium: 135 mmol/L (ref 135–145)

## 2020-11-14 LAB — ECHO INTRAOPERATIVE TEE
AR max vel: 3.48 cm2
AV Area VTI: 3.08 cm2
AV Area mean vel: 3.49 cm2
AV Mean grad: 1 mmHg
AV Peak grad: 2.4 mmHg
Ao pk vel: 0.78 m/s
Height: 73 in
MV VTI: 3.11 cm2
MV Vena cont: 0.27 cm
Weight: 2388.8 oz

## 2020-11-14 LAB — PROTIME-INR
INR: 2.2 — ABNORMAL HIGH (ref 0.8–1.2)
Prothrombin Time: 24.1 seconds — ABNORMAL HIGH (ref 11.4–15.2)

## 2020-11-14 LAB — HEPARIN LEVEL (UNFRACTIONATED): Heparin Unfractionated: <0.1 IU/mL — ABNORMAL LOW (ref 0.30–0.70)

## 2020-11-14 MED ORDER — DAPAGLIFLOZIN PROPANEDIOL 10 MG PO TABS
10.0000 mg | ORAL_TABLET | Freq: Every day | ORAL | 3 refills | Status: DC
  Filled 2020-11-14: qty 30, 30d supply, fill #0

## 2020-11-14 MED ORDER — WARFARIN SODIUM 5 MG PO TABS
5.0000 mg | ORAL_TABLET | Freq: Every day | ORAL | 3 refills | Status: DC
Start: 2020-11-14 — End: 2021-04-02
  Filled 2020-11-14: qty 30, 30d supply, fill #0

## 2020-11-14 MED ORDER — SPIRONOLACTONE 25 MG PO TABS
25.0000 mg | ORAL_TABLET | Freq: Every day | ORAL | 3 refills | Status: DC
  Filled 2020-11-14: qty 30, 30d supply, fill #0

## 2020-11-14 MED ORDER — CARVEDILOL 3.125 MG PO TABS
3.1250 mg | ORAL_TABLET | Freq: Two times a day (BID) | ORAL | 3 refills | Status: DC
  Filled 2020-11-14: qty 60, 30d supply, fill #0

## 2020-11-14 MED ORDER — MIDODRINE HCL 2.5 MG PO TABS
2.5000 mg | ORAL_TABLET | Freq: Three times a day (TID) | ORAL | 3 refills | Status: DC
  Filled 2020-11-14: qty 90, 30d supply, fill #0

## 2020-11-14 MED ORDER — OXYCODONE HCL 5 MG PO TABS
5.0000 mg | ORAL_TABLET | ORAL | 0 refills | Status: DC | PRN
  Filled 2020-11-14: qty 30, 5d supply, fill #0

## 2020-11-14 MED ORDER — DIGOXIN 125 MCG PO TABS
0.1250 mg | ORAL_TABLET | Freq: Every day | ORAL | 3 refills | Status: DC
  Filled 2020-11-14: qty 30, 30d supply, fill #0

## 2020-11-14 MED ORDER — ASPIRIN 81 MG PO TBEC: 81.0000 mg | DELAYED_RELEASE_TABLET | Freq: Every day | ORAL | 11 refills | Status: AC

## 2020-11-14 MED ORDER — ADULT MULTIVITAMIN W/MINERALS CH: 1.0000 | ORAL_TABLET | Freq: Every day | ORAL | Status: AC

## 2020-11-14 MED ORDER — ATORVASTATIN CALCIUM 80 MG PO TABS
80.0000 mg | ORAL_TABLET | Freq: Every day | ORAL | 3 refills | Status: DC
  Filled 2020-11-14: qty 30, 30d supply, fill #0

## 2020-11-14 MED ORDER — WARFARIN SODIUM 5 MG PO TABS: 5.0000 mg | ORAL_TABLET | Freq: Every day | ORAL | Status: DC

## 2020-11-14 NOTE — Progress Notes (Signed)
ANTICOAGULATION CONSULT NOTE - Follow Up Consult  Pharmacy Consult for Heparin + warfarin Indication: LV apical thrombus  Allergies  Allergen Reactions  . Penicillins Other (See Comments)    childhood    Patient Measurements: Height: 6\' 1"  (185.4 cm) Weight: 65.9 kg (145 lb 4.5 oz) IBW/kg (Calculated) : 79.9 kg Heparin Dosing Weight: 77 kg  Vital Signs: Temp: 98.2 F (36.8 C) (05/19 0342) Temp Source: Oral (05/19 0342) BP: 107/70 (05/19 0616) Pulse Rate: 93 (05/19 0616)  Labs: Recent Labs    11/12/20 0128 11/12/20 0935 11/13/20 0137 11/14/20 0033  HGB 8.5*  --  8.3* 8.8*  HCT 25.7*  --  25.1* 26.5*  PLT 261  --  301 363  LABPROT 16.3*  --  20.6* 24.1*  INR 1.3*  --  1.8* 2.2*  HEPARINUNFRC 0.15* 0.24* 0.44 <0.10*  CREATININE 1.19  --  1.15 1.15    Estimated Creatinine Clearance: 56.5 mL/min (by C-G formula based on SCr of 1.15 mg/dL).   Medical History: Past Medical History:  Diagnosis Date  . Anemia   . Asthma    childhood  . Blood in stool   . Childhood asthma   . GI bleed   . Kidney laceration 1999  . 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 PTA the patient is not on anticoagulation. 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 CT output down, CBC stable - pharmacy asked to start IV heparin and warfarin. Pt previously therapeutic on 1150 units/h of heparin - given recent OSH will target lower end of heparin goal and defer bolus and start drip rate lower.  Heparin level undetectable, INR up to 2.2 and therapeutic, CBC stable. Pt likely to discharge today - see recommendations below.  Goal of Therapy:  Heparin level 0.3-0.5 units/ml Monitor platelets by anticoagulation protocol: Yes   Plan:  Stop heparin Warfarin 5mg  daily Would check INR next Monday   , PharmD, BCPS, [facility] Clinical Pharmacist [phone] Please check AMION for all [facility] Pharmacy numbers 11/14/2020

## 2020-11-14 NOTE — Progress Notes (Signed)
A set for discharge home , awaiting ride home.

## 2020-11-14 NOTE — TOC Benefit Eligibility Note (Signed)
Transition of Care [facility]) Benefit Eligibility Note    Patient Details  Name: Troy Johnson MRN: [id number] Date of Birth: 03-07-1951   Medication/Dose: Wilder Glade  10 MG BID  Covered?: Yes  Tier: 2 Drug  Prescription Coverage [facility]   [place] with Person/Company/Phone Number:: ANGELA  @ [facility] [employer] # [phone] OPT- MEMBER  Co-Pay: $22.63  Prior Approval: No  Deductible: Met  Additional Notes: could not give co-pay without PA    Memory Argue Phone Number: 11/14/2020, 9:57 AM

## 2020-11-14 NOTE — Progress Notes (Signed)
      [address]       [place] [address]             [phone]        9 Days Post-Op Procedure(s) (LRB): CORONARY ARTERY BYPASS GRAFTING (CABG) (N/A) TRANSESOPHAGEAL ECHOCARDIOGRAM (TEE) (N/A)  Subjective: He has no specific complaint this am and hopes to go home  Objective: Vital signs in last 24 hours: Temp:  [98.2 F (36.8 C)-99 F (37.2 C)] 98.2 F (36.8 C) (05/19 0342) Pulse Rate:  [90-100] 93 (05/19 0616) Cardiac Rhythm: Normal sinus rhythm (05/19 0342) Resp:  [14-23] 17 (05/19 0342) BP: (107-129)/(70-88) 107/70 (05/19 0616) SpO2:  [94 %-98 %] 98 % (05/19 0342) Weight:  [65.9 kg] 65.9 kg (05/19 0342)  Pre op weight 67.7 kg Current Weight  11/14/20 65.9 kg      Intake/Output from previous day: 05/18 0701 - 05/19 0700 In: 752 [P.O.:480; I.V.:272] Out: 600 [Urine:600]   Physical Exam:  Cardiovascular: RRR Pulmonary: Clear to auscultation bilaterally Abdomen: Soft, non tender, bowel sounds present. Extremities:No lower extremity edema. Wounds: Clean and dry.  No erythema or signs of infection.  Lab Results: CBC: Recent Labs    11/13/20 0137 11/14/20 0033  WBC 7.7 8.7  HGB 8.3* 8.8*  HCT 25.1* 26.5*  PLT 301 363   BMET:  Recent Labs    11/13/20 0137 11/14/20 0033  NA 136 135  K 3.9 4.1  CL 105 103  CO2 23 23  GLUCOSE 99 100*  BUN 14 17  CREATININE 1.15 1.15  CALCIUM 8.9 9.1    PT/INR:  Lab Results  Component Value Date   INR 2.2 (H) 11/14/2020   INR 1.8 (H) 11/13/2020   INR 1.3 (H) 11/12/2020   ABG:  INR: Will add last result for INR, ABG once components are confirmed Will add last 4 CBG results once components are confirmed  Assessment/Plan:  1. CV - S/p NSTEMI. SR with HR in the 90's this am. LV thrombus so on Heparin and Coumadin;pharmacy managing. INR this am increased from 1.8 to 2.2. On Coreg 3.125 mg bid, Midodrine 2.5 mg tid, and Digoxin 0.125 mg daily. Heparin drip to be stopped 2.  Pulmonary -  On room air. Encourage incentive spirometer. 3. Acute systolic CHF-on Farxiga 10 mg daily and Spirinolactone 25 mg daily. Heart failure following. 4.  Expected post op acute blood loss anemia - H and H this am stable at  8.8 and 26.5 5. Await heart failure evaluation;likely discharge home  Jasiya Markie M ZimmermanPA-C 11/14/2020,7:07 AM

## 2020-11-14 NOTE — Progress Notes (Signed)
Physical Therapy Treatment Patient Details Name: Troy Johnson MRN: [id number] DOB: 1950-08-29 Today's Date: 11/14/2020    History of Present Illness 70 yo admitted 5/2 with SOB with acute CHF and NSTEMI. Cath 5/3. CABG x 4 5/10. PMhx: HTN, asthma, seizure    PT Comments    Pt received in chair, agreeable to therapy session and with good participation in stair negotiation and seated exercises for BLE strengthening. Pt performed steps x10 with BUE support and Supervision to simulate home environment, no acute s/sx distress and VSS pre/post. Pt compliant and able to verbalize sternal precautions, encouraged continued use of IS pulling up to with good technique. Pt continues to benefit from PT services to progress toward functional mobility goals. Anticipate pt safe to DC home with initial PRN supervision/assist once medically cleared.  Follow Up Recommendations  No PT follow up;Supervision - Intermittent     Equipment Recommendations  None recommended by PT    Recommendations for Other Services       Precautions / Restrictions Precautions Precautions: Sternal Precaution Comments: pt able to state precautions and good compliance Restrictions Weight Bearing Restrictions: No Other Position/Activity Restrictions: sternal precs    Mobility  Bed Mobility               General bed mobility comments: pt received in chair, reports no concerns with bed mobility    Transfers Overall transfer level: Independent Equipment used: None Transfers: Sit to/from Stand Sit to Stand: Independent         General transfer comment: Pt demonstrates knowledge of sternal precautions, maintaining precautions during transfers.  Ambulation/Gait Ambulation/Gait assistance: Supervision Gait Distance (Feet): 15 Feet Assistive device: None Gait Pattern/deviations: Step-through pattern;Decreased stride length Gait velocity: decreased   General Gait Details: pt just returned from  hallway ambulation with mobility tech and c/o fatigue but agreeable to stair training in room so limited distance this date.   Stairs Stairs: Yes Stairs assistance: Supervision Stair Management: Two rails;Step to pattern;Forwards Number of Stairs: 10 General stair comments: pt ascended/descended 7" step in room with BUE support of RW to simulate handrails per home setup, no LOB and able to alternate which leg ascended/descended with no buckling either method; VSS   Wheelchair Mobility    Modified Rankin (Stroke Patients Only)       Balance Overall balance assessment: Needs assistance Sitting-balance support: Feet supported;No upper extremity supported Sitting balance-Leahy Scale: Good     Standing balance support: During functional activity;No upper extremity supported Standing balance-Leahy Scale: Good Standing balance comment: Pt does not require UE support with static standing and gait. Pt reports feeling slightly wobbly compared to baseline.                            Cognition Arousal/Alertness: Awake/alert Behavior During Therapy: WFL for tasks assessed/performed Overall Cognitive Status: Within Functional Limits for tasks assessed                                 General Comments: pleasantly participatory      Exercises Other Exercises Other Exercises: IS x 5 reps 1250 with good technique, encouraged hourly use Other Exercises: step-ups x10 reps for BLE strengthening Other Exercises: Reviewed gradual increase of activity within tolerance and close monitoring of symptoms initially, progressive walking program and step-ups only if second person present to guard/assist for safety    General Comments General comments (  skin integrity, edema, etc.): SpO2 97% on RA and HR 94 after exertion; 89 bpm resting HR; BP 100/71 pre-mobility and pt deferring post-activity BP or bed mobility practice, wanting to rest then get ready for upcoming discharge. No  dizziness reported.      Pertinent Vitals/Pain Pain Assessment: No/denies pain    Home Living                      Prior Function            PT Goals (current goals can now be found in the care plan section) Acute Rehab PT Goals Patient Stated Goal: return home PT Goal Formulation: With patient Time For Goal Achievement: 11/22/20 Potential to Achieve Goals: Good Progress towards PT goals: Progressing toward goals    Frequency    Min 3X/week      PT Plan Current plan remains appropriate    Co-evaluation              AM-PAC PT "6 Clicks" Mobility   Outcome Measure  Help needed turning from your back to your side while in a flat bed without using bedrails?: None Help needed moving from lying on your back to sitting on the side of a flat bed without using bedrails?: None Help needed moving to and from a bed to a chair (including a wheelchair)?: None Help needed standing up from a chair using your arms (e.g., wheelchair or bedside chair)?: None Help needed to walk in hospital room?: A Little Help needed climbing 3-5 steps with a railing? : A Little 6 Click Score: 22    End of Session Equipment Utilized During Treatment: Gait belt Activity Tolerance: Patient tolerated treatment well Patient left: in chair;with call bell/phone within reach Nurse Communication: Mobility status PT Visit Diagnosis: Other abnormalities of gait and mobility (R26.89)     Time: 7425-9563 PT Time Calculation (min) (ACUTE ONLY): 11 min  Charges:  $Therapeutic Exercise: 8-22 mins                     Nyilah Kight P., PTA Acute Rehabilitation Services Pager: [phone] Office: [phone]   Dorathy Kinsman Ashvik Grundman 11/14/2020, 1:09 PM

## 2020-11-14 NOTE — Progress Notes (Signed)
Discharged home accompanied by niece, belongings taken home.

## 2020-11-14 NOTE — Progress Notes (Signed)
CARDIAC REHAB PHASE I   D/c education completed with pt. Pt educated on importance of site care and monitoring incisions daily. Encouraged continued IS use, walks, and sternal precautions. Pt given in-the-tube sheet along with heart healthy diet. Reviewed restrictions and exercise guidelines. Will refer to CRP II [place].  [id number] Reynold Bowen, RN BSN 11/14/2020 11:36 AM

## 2020-11-14 NOTE — Progress Notes (Addendum)
Patient ID: Troy Johnson, male   DOB: 11/27/1950, 70 y.o.   MRN: [id number]     Advanced Heart Failure Rounding Note  PCP-Cardiologist: None   Subjective:    CABG (5/10): LIMA-LAD, SVG-D2, SVG-OM1, SVG-PDA.   INR now therapeutic at 2.2   Feels well today. No CP or dyspnea. SBP still soft but no dizziness. Back on midodrine.    Objective:   Weight Range: 65.9 kg Body mass index is 19.17 kg/m.   Vital Signs:   Temp:  [98 F (36.7 C)-99 F (37.2 C)] 98 F (36.7 C) (05/19 0727) Pulse Rate:  [89-100] 89 (05/19 0727) Resp:  [14-23] 19 (05/19 0727) BP: (100-129)/(70-88) 100/71 (05/19 0727) SpO2:  [94 %-98 %] 96 % (05/19 0727) Weight:  [65.9 kg] 65.9 kg (05/19 0342) Last BM Date: 11/12/20  Weight change: Filed Weights   11/12/20 0300 11/13/20 0300 11/14/20 0342  Weight: 69.5 kg 67 kg 65.9 kg    Intake/Output:   Intake/Output Summary (Last 24 hours) at 11/14/2020 0910 Last data filed at 11/14/2020 0343 Gross per 24 hour  Intake 631.95 ml  Output 0 ml  Net 631.95 ml      Physical Exam   PHYSICAL EXAM: General:  Well appearing. No respiratory difficulty HEENT: normal Neck: supple. no JVD. Carotids 2+ bilat; no bruits. No lymphadenopathy or thyromegaly appreciated. Cor: PMI nondisplaced. Regular rate & rhythm. No rubs, gallops or murmurs. Sternotomy site ok. Lungs: clear Abdomen: soft, nontender, nondistended. No hepatosplenomegaly. No bruits or masses. Good bowel sounds. Extremities: no cyanosis, clubbing, rash, edema Neuro: alert & oriented x 3, cranial nerves grossly intact. moves all 4 extremities w/o difficulty. Affect pleasant.    Telemetry    NSR 70s, personally review   Labs    CBC Recent Labs    11/13/20 0137 11/14/20 0033  WBC 7.7 8.7  HGB 8.3* 8.8*  HCT 25.1* 26.5*  MCV 96.9 97.4  PLT 301 363   Basic Metabolic Panel Recent Labs    01/75/10 0128 11/13/20 0137 11/14/20 0033  NA 135 136 135  K 4.3 3.9 4.1  CL 102 105 103  CO2 24 23  23   GLUCOSE 95 99 100*  BUN 14 14 17   CREATININE 1.19 1.15 1.15  CALCIUM 9.0 8.9 9.1  MG 2.0  --   --    Liver Function Tests No results for input(s): AST, ALT, ALKPHOS, BILITOT, PROT, ALBUMIN in the last 72 hours. No results for input(s): LIPASE, AMYLASE in the last 72 hours. Cardiac Enzymes No results for input(s): CKTOTAL, CKMB, CKMBINDEX, TROPONINI in the last 72 hours.  BNP: BNP (last 3 results) Recent Labs    10/28/20 1433 11/02/20 0205  BNP 1,827.5* 1,261.6*    ProBNP (last 3 results) No results for input(s): PROBNP in the last 8760 hours.   D-Dimer No results for input(s): DDIMER in the last 72 hours. Hemoglobin A1C No results for input(s): HGBA1C in the last 72 hours. Fasting Lipid Panel No results for input(s): CHOL, HDL, LDLCALC, TRIG, CHOLHDL, LDLDIRECT in the last 72 hours. Thyroid Function Tests No results for input(s): TSH, T4TOTAL, T3FREE, THYROIDAB in the last 72 hours.  Invalid input(s): FREET3  Other results:   Imaging    No results found.   Medications:     Scheduled Medications: . aspirin EC  81 mg Oral Daily   Or  . aspirin  81 mg Per Tube Daily  . atorvastatin  80 mg Oral Daily  . bisacodyl  10 mg Oral Daily  Or  . bisacodyl  10 mg Rectal Daily  . carvedilol  3.125 mg Oral BID WC  . Chlorhexidine Gluconate Cloth  6 each Topical Daily  . dapagliflozin propanediol  10 mg Oral Daily  . digoxin  0.125 mg Oral Daily  . docusate sodium  200 mg Oral Daily  . feeding supplement  237 mL Oral BID BM  . mouth rinse  15 mL Mouth Rinse BID  . midodrine  2.5 mg Oral TID WC  . multivitamin with minerals  1 tablet Oral Daily  . pantoprazole  40 mg Oral Daily  . sodium chloride flush  10-40 mL Intracatheter Q12H  . sodium chloride flush  3 mL Intravenous Q12H  . spironolactone  25 mg Oral Daily  . warfarin  5 mg Oral q1600  . Warfarin - Pharmacist Dosing Inpatient   Does not apply q1600    Infusions: . lactated ringers      PRN  Medications: ipratropium-albuterol, lactated ringers, metoprolol tartrate, ondansetron (ZOFRAN) IV, oxyCODONE, traMADol   Assessment/Plan   1. CAD: No chest pain, presented with dyspnea.  NSTEMI with HS-TnI elevated to peak 746.  Cath showed severe multivessel CAD with reasonable targets for CABG.  However, EF < 20%.  Cardiac MRI suggested extensive viability (except for apical septal wall and true apex).  Now s/p CABG x 4 on 5/10.  - ASA + atorvastatin 80 mg daily.  2. Acute systolic CHF: Ischemic cardiomyopathy.  Echo this admission with EF <20%, the septum and apex were akinetic, RV function looked low normal, moderate MR.  Cardiac MRI showed EF 14% with scar in the apical septal wall and ap ex, mildly decreased RV systolic function. Suspect significant viability as above.  Now s/p CABG. No dyspnea, euvolemic on exam.  - Continue digoxin 0.125 daily. - Continue Coreg 3.125 mg bid.     - Continue spironolactone 25 mg daily.   - Hold off on entresto, BP has been low.  - Continue Farxiga 10 mg daily.  - BP stable, stop midodrine today. - Would hope for improvement in function with revascularization, will repeat echo at 3 months to determine need for ICD.  3. LV thrombus: Small, noted on echo and cMRI. On warfain. INR 2.2   4. Mitral regurgitation: Likely functional.  Moderate.  5. Hyperlipidemia: Very high LDL.   - Atorvastatin 80 daily.  - May need Repatha in future.  6. Thrombocytopenia: Post-op, improved. Resolved.   7. PVCs/NSVT: Coreg.   Ok for d/c home from CHF standpoint. [facility] and [facility] f/u arranged. Appt info in AVS   Meds for discharge  ASA 81 mg daily  Atorvastatin 80  Mg daily  Coreg 3.125 mg bid Farxiga 10 mg daily  Digoxin 0.125 mg daily  Spironolactone 25 mg daily  Warfarin 5 mg daily, per pharmD   Robbie Lis, PA-C  11/14/2020 9:10 AM  Patient seen with PA, agree with the above note.  Looks good today, INR up to 2.2.  I think he can go home.  BP  stable, would stop midodrine and continue his other cardiac meds as per the above list.  He has followup with CHF clinic and coumadin clinic.   Marca Ancona 11/14/2020 10:29 AM

## 2020-11-14 NOTE — Progress Notes (Signed)
Mobility Specialist - Progress Note   11/14/20 1042  Mobility  Activity Ambulated in hall  Level of Assistance Standby assist, set-up cues, supervision of patient - no hands on  Assistive Device None  Distance Ambulated (ft) 400 ft  Mobility Ambulated with assistance in hallway  Mobility Response Tolerated well  Mobility performed by Mobility specialist  $Mobility charge 1 Mobility   Pre-mobility: 90 HR, 98/56 BP During mobility: 103 HR Post-mobility: 90 HR, 102/60 BP  Pt asx throughout ambulation. Pt to recliner after walk, call bell at side. VSS throughout.  Mamie Levers Mobility Specialist Mobility Specialist Phone: [phone]

## 2020-11-15 ENCOUNTER — Other Ambulatory Visit: Payer: Self-pay

## 2020-11-15 ENCOUNTER — Telehealth (INDEPENDENT_AMBULATORY_CARE_PROVIDER_SITE_OTHER): Payer: Self-pay | Admitting: Thoracic Surgery (Cardiothoracic Vascular Surgery)

## 2020-11-15 DIAGNOSIS — Z951 Presence of aortocoronary bypass graft: Secondary | ICD-10-CM

## 2020-11-15 NOTE — Progress Notes (Signed)
     [address]       [name] [address]             [phone]       I attempted to call the patient, but he did not answer his cell phone or house number  Corliss Skains

## 2020-11-18 ENCOUNTER — Other Ambulatory Visit: Payer: Self-pay

## 2020-11-18 ENCOUNTER — Ambulatory Visit (INDEPENDENT_AMBULATORY_CARE_PROVIDER_SITE_OTHER): Payer: Federal, State, Local not specified - PPO | Admitting: *Deleted

## 2020-11-18 ENCOUNTER — Ambulatory Visit: Payer: Medicare Other | Admitting: Diagnostic Neuroimaging

## 2020-11-18 DIAGNOSIS — Z5181 Encounter for therapeutic drug level monitoring: Secondary | ICD-10-CM | POA: Insufficient documentation

## 2020-11-18 DIAGNOSIS — I513 Intracardiac thrombosis, not elsewhere classified: Secondary | ICD-10-CM | POA: Diagnosis not present

## 2020-11-18 LAB — PROTIME-INR
INR: 6.4 (ref 0.9–1.2)
Prothrombin Time: 60.1 s — ABNORMAL HIGH (ref 9.1–12.0)

## 2020-11-18 LAB — POCT INR: INR: 6.6 — AB (ref 2.0–3.0)

## 2020-11-18 NOTE — Patient Instructions (Addendum)
Description   Called and spoke to pt's niece gave her the following warfarin instructions for pt:  -5/23, 5/24 and 5/25, hold warfarin -5/26, Take 1/2 a tablet -5/27, Take 1 tablet -5/28, Take 1/2 a tablet -5/29, Take 1 tablet -5/30, Take 1 tablet -5/31- Get INR checked -Weekly warfarin dose decreased to 1 tablet daily excpet for 1/2 a tablet on Tuesday, Thursday and Saturday. Cou[facility] Clinic [phone] -Niece verbalized understanding of warfarin instructions.  -A full discussion of the nature of anticoagulants has been carried out.  A benefit risk analysis has been presented to the patient, so that they understand the justification for choosing anticoagulation at this time. The need for frequent and regular monitoring, precise dosage adjustment and compliance is stressed.  Side effects of potential bleeding are discussed.  The patient should avoid any OTC items containing aspirin or ibuprofen, and should avoid great swings in general diet.  Avoid alcohol consumption.  Call if any signs of abnormal bleeding.

## 2020-11-22 ENCOUNTER — Telehealth: Payer: Self-pay | Admitting: *Deleted

## 2020-11-22 NOTE — Telephone Encounter (Signed)
Patient's niece, Cheron, called requesting a f/u appt with Dr. Cliffton Asters. Patient's d/c paperwork stated patient was to f/u with a virtual appt on 5/27. This appt was rescheduled for 5/20 but patient missed the phone call. New virtual appt made with Dr. Cliffton Asters for Friday 6/3 at 2:20. Niece requests for Dr. Cliffton Asters to call her phone number at [phone]. Niece aware to be by the telephone between 2pm-5pm. No further questions.

## 2020-11-24 NOTE — Progress Notes (Addendum)
[place] at [facility] [address] [place], [place] [address] [phone]  Date:  11/27/2020   Name:  Troy Johnson   DOB:  02/09/1951   MRN:  [id number]  PCP:  Darreld Mclean, MD    Chief Complaint: Hospitalization Follow-up (11/03/2020: SOB, Heart Failure. Started: Asa, Coreg, Digoxin, Farxiga, MVI, Oxy, Spironolactone, ans Coumadin. /Pts family would like to know if it is okay to increase sodium now, if they can have education on nutrition. Wants to know how his weight looks. )   History of Present Illness:  Troy Johnson is a 70 y.o. very pleasant male patient who presents with the following:  Following up from recent hospital admission for acute CHF, NSTEMI and CABG surgery I have not actually seen him since 2018, making him no longer my active patient.  However we are glad to see him as he does not have another PCP at this time   Admit date: 10/28/2020 Discharge date: 11/14/2020 Admission Diagnoses:      Patient Active Problem List   Diagnosis Date Noted  . Malnutrition of moderate degree 11/02/2020  . Acute CHF (congestive heart failure) ([place]) 10/28/2020  . HFrEF (heart failure with reduced ejection fraction) ([place]) 10/28/2020  . NSTEMI (non-ST elevated myocardial infarction) ([place])   . Prediabetes 12/10/2019  . Anemia 12/16/2016  . Syncope 12/16/2016  . H/O: GI bleed 12/09/2016   Discharge Diagnoses:      Patient Active Problem List   Diagnosis Date Noted  . 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 Malnutrition of moderate degree 11/02/2020  . Acute CHF (congestive heart failure) ([place]) 10/28/2020  . HFrEF (heart failure with reduced ejection fraction) ([place]) 10/28/2020  . NSTEMI (non-ST elevated myocardial infarction) ([place])   . Prediabetes 12/10/2019  . Anemia 12/16/2016  . Syncope 12/16/2016  . H/O: GI bleed 12/09/2016    Patient is a 70 year oldAfrican Americanmale with a past medical history of remote  tobacco abuse, possible seizure,anemia/possibleGI bleed who presented to [facility] ED with complaints ofprogressiveshortness of breath. Patient has had progressive shortness of breath, which began last Friday 04/29. He denies chest pain, coughing LE edema, fever. He also had orthopnea so he went to his primary care physician on 05/ 02. Initially, EMS called a code STEMI but Dr. Virgina Jock reviewed the EKG and cancelled code STEMI.CXR showed borderline mild congestive heart failure and trace right pleural effusion and initial BNP was 1827. In addition, initial Troponin I (high sensitivity) was 656 and went up to 746. Patient did rule in a for a NSTEMI. Patient was given IV Lasix and Coreg. Patient underwent a cardiac catheterization on 10/29/2020 which showed a 90% proximal stenosis of the LAD, 95% stenosis in first Diagonal, a 95% stenosis of the ostial Circumflex to Proximal Circumflex, and an 80% proximal RCA stenosis with a 100% proximal to distal RCA stenosis, and a 70% stenosis of the RPDA. The patient was admitted for further care and cardiothoracic consultation was requested.  Hospital Course: The patient was evaluated by Dr. Kipp Brood for possible coronary bypass grafting.  Review of patient's imaging workup revealed the patient to have an EF of less than 20%.  There was also evidence of global hypokinesis with LV apical thrombus.  Due to this it was felt the patient would require a viability study prior to deciding if surgery was an option.  This was performed and showed evidence of viability.  Advanced heart failure team was  consulted for medical optimization prior to proceeding with surgical intervention.  The patient remained chest pain free and was taken to the operating room on 11/05/2020.  He underwent CABG x 4 utilizing LIMA to LAD, SVG to PDA, SVG to OM, and SVG to Diagonal.  He also underwent endoscopic harvest of greater saphenous vein from his left leg.  He tolerated the procedure without  difficulty and was taken to the SICU in stable condition.  He was extubated the evening of surgery.  Post operatively he required pressor/inotropic support with  Levophed and Dobutamine .  These were weaned off as hemodynamics allowed on 5/12 and 5/14.  His home digoxin was resumed.  His chest tubes were removed without difficulty.  He has remained hypotensive and was started on Midodrine.  He was started on Coumadin on 5/13 for his LV thrombus.  Daily PT and INR were monitored. He was maintaining NSR with PVCs.  He was started on Farxiga.   He was transferred to the progressive care unit on 11/09/2020.  His BP improved and he was weaned off Midodrine with last dose on 5/16. Patient's INR on 05/18 went from 1.3 to 1.8. Heparin drip was continued. Midodrine 5 mg tid was restarted on 05/17 and he was started on Coreg 3.125 mg bid. Midodrine was then decreased to 2.5 mg tid on 05/18 and Spironolactone was increased to 25 mg daily. Per heart failure, Midodrine was stopped as not symptomatic with SBP in the low 100's. INR was up to 2.2 on 05/19. As discussed with advanced heart failure, the patient is felt stable for discharge today.  Consults: Advance Heart Failure Significant Diagnostic Studies: angiography:   LM: Long vessel. Normal LAD: Prox 90% stenosis, followed by flush occlusion after Diag 1 No antegrade flow seen in LAD Faint collaterals from RPL fill small caliber LAD Diag 1 with prox tandem 95% stenoses LCx: Prox tandem 95% stenoses Prox OM1 60% stenosis Mid LCx 60% stenosis RCA: Prox 90% stenosis, followed by 100% occlusion Bridging collaterals faintly fill mid RCA, and reconstitute distal RCA Prox RPDA 70% stenosis, followed by 50 stenosis PA: 39/14 mmHg, mean PAP 23 mmHg PCW: 6 mmHg Compensated ischemic cardiomyopathy Treatments: surgery:    Patient is seen today accompanied by his daughter and his niece who contribute to the  history He is feeling relatively well-he is still tired, and his chest is sore from surgery but overall he is making progress Not doing cardiac rehab as of yet Dr. Kipp Brood is his surgeon-he actually has follow-up scheduled for this Friday They state that Dr Aundra Dubin is his cardiologist - we are not sure if he will stay with the heart failure team or return to general cardiology going forward He does have a cardiology appointment for 6/9, I will confirm with Dr. Aundra Dubin that everything is arranged as needed  We discussed his medications, answered several questions today.  They are being very diligent in following a low-sodium diet and his weight has maintained stable  Wt Readings from Last 3 Encounters:  11/27/20 139 lb 9.6 oz (63.3 kg)  11/14/20 145 lb 4.5 oz (65.9 kg)  12/13/19 172 lb (78 kg)   He notes that his appetite is ok-he did lose some weight during his hospitalization, he hopes to gain this back  He is sleeping in his recliner for now due to his incision pains but not due to orthopnea No issues with bleeding or bruising since he got home  Lab Results  Component Value Date  INR 3.5 (A) 11/26/2020   INR 6.6 (A) 11/18/2020   INR 6.4 (HH) 11/18/2020   INR checked yesterday  -trending in the right direction     Patient Active Problem List   Diagnosis Date Noted  . Encounter for therapeutic drug monitoring 11/18/2020  . 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 Malnutrition of moderate degree 11/02/2020  . Acute CHF (congestive heart failure) ([place]) 10/28/2020  . HFrEF (heart failure with reduced ejection fraction) ([place]) 10/28/2020  . NSTEMI (non-ST elevated myocardial infarction) ([place])   . Prediabetes 12/10/2019  . Anemia 12/16/2016  . Syncope 12/16/2016  . H/O: GI bleed 12/09/2016    Past Medical History:  Diagnosis Date  . Anemia   . Asthma    childhood  . Blood in stool   . Childhood asthma   . GI bleed   . Kidney laceration 1999   . Seizures ([place])     Past Surgical History:  Procedure Laterality Date  . CORONARY ARTERY BYPASS GRAFT N/A 11/05/2020   Procedure: CORONARY ARTERY BYPASS GRAFTING (CABG);  Surgeon: Lajuana Matte, MD;  Location: [facility];  Service: Open Heart Surgery;  Laterality: N/A;  WITH POSSIBLE BALLOON PUMP  . KIDNEY SURGERY     for laceration  . RIGHT/LEFT HEART CATH AND CORONARY ANGIOGRAPHY N/A 10/29/2020   Procedure: RIGHT/LEFT HEART CATH AND CORONARY ANGIOGRAPHY;  Surgeon: Nigel Mormon, MD;  Location: [facility];  Service: Cardiovascular;  Laterality: N/A;  . TEE WITHOUT CARDIOVERSION N/A 11/05/2020   Procedure: TRANSESOPHAGEAL ECHOCARDIOGRAM (TEE);  Surgeon: Lajuana Matte, MD;  Location: [facility];  Service: Open Heart Surgery;  Laterality: N/A;    Social History   Tobacco Use  . Smoking status: Former Research scientist (life sciences)  . Smokeless tobacco: Never Used  . Tobacco comment: quit in 90's  Substance Use Topics  . Alcohol use: Yes    Comment: 1 beer/month  . Drug use: No    Family History  Problem Relation Age of Onset  . Cervical cancer Mother   . Hypertension Father   . Colon cancer Neg Hx   . Pancreatic cancer Neg Hx   . Esophageal cancer Neg Hx   . Stomach cancer Neg Hx     Allergies  Allergen Reactions  . Penicillins Other (See Comments)    childhood    Medication list has been reviewed and updated.  Current Outpatient Medications on File Prior to Visit  Medication Sig Dispense Refill  . aspirin EC 81 MG EC tablet Take 1 tablet (81 mg total) by mouth daily. Swallow whole. 30 tablet 11  . atorvastatin (LIPITOR) 80 MG tablet Take 1 tablet (80 mg total) by mouth daily. 30 tablet 3  . carvedilol (COREG) 3.125 MG tablet Take 1 tablet (3.125 mg total) by mouth 2 (two) times daily with a meal. 60 tablet 3  . dapagliflozin propanediol (FARXIGA) 10 MG TABS tablet Take 1 tablet (10 mg total) by mouth daily. 30 tablet 3  . digoxin (LANOXIN) 0.125 MG tablet Take 1 tablet  (0.125 mg total) by mouth daily. 30 tablet 3  . Multiple Vitamin (MULTIVITAMIN WITH MINERALS) TABS tablet Take 1 tablet by mouth daily.    Marland Kitchen oxyCODONE (OXY IR/ROXICODONE) 5 MG immediate release tablet Take 1 tablet (5 mg total) by mouth every 4 (four) hours as needed for severe pain. 30 tablet 0  . spironolactone (ALDACTONE) 25 MG tablet Take 1 tablet (25 mg total) by mouth daily. [place]  tablet 3  . warfarin (COUMADIN) 5 MG tablet Take 1 tablet (5 mg total) by mouth daily at 4 PM. (Patient taking differently: Take 5 mg by mouth daily at 4 PM. 5 mg, and 2.5 rotating as of right now.) 30 tablet 3   No current facility-administered medications on file prior to visit.    Review of Systems:  As per HPI- otherwise negative.   Physical Examination: Vitals:   11/27/20 1342  BP: 124/80  Pulse: 78  Resp: 18  Temp: 98.3 F (36.8 C)  SpO2: 100%   Vitals:   11/27/20 1342  Weight: 139 lb 9.6 oz (63.3 kg)   Body mass index is 18.42 kg/m. Ideal Body Weight:    GEN: no acute distress.  Thin build, looks well HEENT: Atraumatic, Normocephalic.  Ears and Nose: No external deformity. CV: RRR, No M/G/R. No JVD. No thrill. No extra heart sounds. PULM: CTA B, no wheezes, crackles, rhonchi. No retractions. No resp. distress. No accessory muscle use. ABD: S, NT, ND, +BS. No rebound. No HSM. EXTR: No c/c/e PSYCH: Normally interactive. Conversant.  Appears euvolemic Chest and extremity incisions are healing well, no evidence of infection  Assessment and Plan: Hospital discharge follow-up  Acute congestive heart failure, unspecified heart failure type ([place]) - Plan: CBC, Comprehensive metabolic panel  Coronary artery disease involving native coronary artery of native heart, unspecified whether angina present  S/P CABG x 4  Encounter for hepatitis C screening test for low risk patient - Plan: Hepatitis C antibody   Patient seen today for hospital follow-up.  He presented to the ER last month with  chest pain, was found to have multivessel CAD as well as acute CHF.  He had CABG on May 10.  He also had a left ventricle thrombus and is being treated with Coumadin He had an EF of 20% on most recent echo prior to his surgery We will follow-up on a c-Met and CBC, routine hep C screening today Praised their diligent compliance with weight checks and sodium restriction-they will continue to monitor sodium and daily weights  They are following up with CT surgery later this week, cardiology next week This visit occurred during the SARS-CoV-2 public health emergency.  Safety protocols were in place, including screening questions prior to the visit, additional usage of staff PPE, and extensive cleaning of exam room while observing appropriate contact time as indicated for disinfecting solutions.    Signed Lamar Blinks, MD  Received his labs 6/3-  Results for orders placed or performed in visit on 11/27/20  CBC  Result Value Ref Range   WBC 5.2 4.0 - 10.5 K/uL   RBC 3.30 (L) 4.22 - 5.81 Mil/uL   Platelets 642.0 (H) 150.0 - 400.0 K/uL   Hemoglobin 10.5 (L) 13.0 - 17.0 g/dL   HCT 31.7 (L) 39.0 - 52.0 %   MCV 95.9 78.0 - 100.0 fl   MCHC 33.0 30.0 - 36.0 g/dL   RDW 15.5 11.5 - 15.5 %  Comprehensive metabolic panel  Result Value Ref Range   Sodium 137 135 - 145 mEq/L   Potassium 5.6 No hemolysis seen (H) 3.5 - 5.1 mEq/L   Chloride 100 96 - 112 mEq/L   CO2 28 19 - 32 mEq/L   Glucose, Bld 79 70 - 99 mg/dL   BUN 26 (H) 6 - 23 mg/dL   Creatinine, Ser 1.16 0.40 - 1.50 mg/dL   Total Bilirubin 0.5 0.2 - 1.2 mg/dL   Alkaline Phosphatase 106 39 -  117 U/L   AST 22 0 - 37 U/L   ALT 29 0 - 53 U/L   Total Protein 7.4 6.0 - 8.3 g/dL   Albumin 4.1 3.5 - 5.2 g/dL   GFR 64.22 >60.00 mL/min   Calcium 10.2 8.4 - 10.5 mg/dL  Hepatitis C antibody  Result Value Ref Range   Hepatitis C Ab NON-REACTIVE NON-REACTIVE   SIGNAL TO CUT-OFF 0.01 <1.00

## 2020-11-24 NOTE — Patient Instructions (Addendum)
It was nice to see you again today. I am glad you are better!  Continue to follow the low sodium diet for now If your weight goes up by more than 2 lbs in a day please let me know I will be in touch with your labs asap, and I will figure out your regular cardiology (as opposed to heart surgery) follow-up plan

## 2020-11-26 ENCOUNTER — Other Ambulatory Visit: Payer: Self-pay

## 2020-11-26 ENCOUNTER — Ambulatory Visit (INDEPENDENT_AMBULATORY_CARE_PROVIDER_SITE_OTHER): Payer: Federal, State, Local not specified - PPO

## 2020-11-26 DIAGNOSIS — Z5181 Encounter for therapeutic drug level monitoring: Secondary | ICD-10-CM | POA: Diagnosis not present

## 2020-11-26 DIAGNOSIS — I513 Intracardiac thrombosis, not elsewhere classified: Secondary | ICD-10-CM | POA: Diagnosis not present

## 2020-11-26 LAB — POCT INR: INR: 3.5 — AB (ref 2.0–3.0)

## 2020-11-26 NOTE — Patient Instructions (Signed)
-   skip warfarin tonight, then  - continue 1 tablet daily excpet for 1/2 a tablet on Tuesday, Thursday.  - Recheck INR next week Call if any signs of abnormal bleeding.

## 2020-11-27 ENCOUNTER — Ambulatory Visit (INDEPENDENT_AMBULATORY_CARE_PROVIDER_SITE_OTHER): Payer: Federal, State, Local not specified - PPO | Admitting: Family Medicine

## 2020-11-27 VITALS — BP 124/80 | HR 78 | Temp 98.3°F | Resp 18 | Wt 139.6 lb

## 2020-11-27 DIAGNOSIS — I2510 Atherosclerotic heart disease of native coronary artery without angina pectoris: Secondary | ICD-10-CM | POA: Diagnosis not present

## 2020-11-27 DIAGNOSIS — Z1159 Encounter for screening for other viral diseases: Secondary | ICD-10-CM | POA: Diagnosis not present

## 2020-11-27 DIAGNOSIS — Z951 Presence of aortocoronary bypass graft: Secondary | ICD-10-CM

## 2020-11-27 DIAGNOSIS — I509 Heart failure, unspecified: Secondary | ICD-10-CM | POA: Diagnosis not present

## 2020-11-27 DIAGNOSIS — Z09 Encounter for follow-up examination after completed treatment for conditions other than malignant neoplasm: Secondary | ICD-10-CM

## 2020-11-28 LAB — CBC
HCT: 31.7 % — ABNORMAL LOW (ref 39.0–52.0)
Hemoglobin: 10.5 g/dL — ABNORMAL LOW (ref 13.0–17.0)
MCHC: 33 g/dL (ref 30.0–36.0)
MCV: 95.9 fl (ref 78.0–100.0)
Platelets: 642 10*3/uL — ABNORMAL HIGH (ref 150.0–400.0)
RBC: 3.3 Mil/uL — ABNORMAL LOW (ref 4.22–5.81)
RDW: 15.5 % (ref 11.5–15.5)
WBC: 5.2 10*3/uL (ref 4.0–10.5)

## 2020-11-28 LAB — HEPATITIS C ANTIBODY
Hepatitis C Ab: NONREACTIVE
SIGNAL TO CUT-OFF: 0.01 (ref <1.00)

## 2020-11-29 ENCOUNTER — Telehealth (HOSPITAL_COMMUNITY): Payer: Self-pay

## 2020-11-29 ENCOUNTER — Encounter: Payer: Self-pay | Admitting: Family Medicine

## 2020-11-29 ENCOUNTER — Other Ambulatory Visit: Payer: Self-pay

## 2020-11-29 ENCOUNTER — Telehealth (INDEPENDENT_AMBULATORY_CARE_PROVIDER_SITE_OTHER): Payer: Self-pay | Admitting: Thoracic Surgery (Cardiothoracic Vascular Surgery)

## 2020-11-29 DIAGNOSIS — Z951 Presence of aortocoronary bypass graft: Secondary | ICD-10-CM

## 2020-11-29 LAB — COMPREHENSIVE METABOLIC PANEL
ALT: 29 U/L (ref 0–53)
AST: 22 U/L (ref 0–37)
Albumin: 4.1 g/dL (ref 3.5–5.2)
Alkaline Phosphatase: 106 U/L (ref 39–117)
BUN: 26 mg/dL — ABNORMAL HIGH (ref 6–23)
CO2: 28 mEq/L (ref 19–32)
Calcium: 10.2 mg/dL (ref 8.4–10.5)
Chloride: 100 mEq/L (ref 96–112)
Creatinine, Ser: 1.16 mg/dL (ref 0.40–1.50)
GFR: 64.22 mL/min (ref >60.00)
Glucose, Bld: 79 mg/dL (ref 70–99)
Potassium: 5.6 mEq/L — ABNORMAL HIGH (ref 3.5–5.1)
Sodium: 137 mEq/L (ref 135–145)
Total Bilirubin: 0.5 mg/dL (ref 0.2–1.2)
Total Protein: 7.4 g/dL (ref 6.0–8.3)

## 2020-11-29 NOTE — Progress Notes (Signed)
     [address]       [name] [address]             [phone]       Patient: Home Provider: Office Consent for Telemedicine visit obtained.  Today's visit was completed via a real-time telehealth (see specific modality noted below). The patient/authorized person provided oral consent at the time of the visit to engage in a telemedicine encounter with the present provider at [facility]. The patient/authorized person was informed of the potential benefits, limitations, and risks of telemedicine. The patient/authorized person expressed understanding that the laws that protect confidentiality also apply to telemedicine. The patient/authorized person acknowledged understanding that telemedicine does not provide emergency services and that he or she would need to call 911 or proceed to the nearest hospital for help if such a need arose.  . Total time spent in the clinical discussion 10 minutes. . Telehealth Modality: Phone visit (audio only)  I had a telephone visit with [name] and his nieces.  He states that he is doing well.  He does have some exertional dyspnea.  He is monitoring his weight and salt intake very closely.  He denies any pain at his incisions.  He is no longer on any of the pain medication.  I will see him back in 1 month with a chest x-ray. I will ensure that he has follow-up with the heart failure clinic.

## 2020-11-29 NOTE — Telephone Encounter (Signed)
Per phase I cardiac rehab, fax cardiac rehab referral to [facility]. 

## 2020-12-02 ENCOUNTER — Telehealth: Payer: Self-pay | Admitting: Family Medicine

## 2020-12-02 NOTE — Telephone Encounter (Signed)
Daughter: Alaric Gladwin Phone Number: [phone]  Patient's daughter e-mails FMLA paperwork to our office. Paperwork from [employer], 9 pages. The document was placed in the doctor's bin. Please call daughter when ready.

## 2020-12-03 NOTE — Telephone Encounter (Signed)
FMLA placed in PCP folder for review.

## 2020-12-04 ENCOUNTER — Telehealth: Payer: Self-pay | Admitting: Family Medicine

## 2020-12-04 NOTE — Telephone Encounter (Signed)
Spoke with Daughter Deanna Artis about FMLA paperwork, got completed and will fax and mail copies

## 2020-12-04 NOTE — Progress Notes (Signed)
Advanced Heart Failure Clinic Note   Referring Physician: PCP: Copland, Gwenlyn Found, MD PCP-Cardiologist:  Dr. Joaquim Nam  AHFC: Dr. Shirlee Latch  CT Surgeon: Dr. Cliffton Asters   Reason for Visit: [facility] F/u for Systolic Heart Failure 2/2 ICM s/p CABG   HPI:  70 y/o AAM w/ h/o anemia, ?prior seizure, former smoker (quit 70 y/o), HLD and newly diagnosed ischemic heart disease/ systolic heart failure.   Admitted to [facility] 5/22 w/ exertional dyspnea and found to be in acute CHF and ruled in for NSTEMi, Hs trop, 656>>746. EKG showed inferolateral TWI. Diuresed w/ IV lasix and placed on IV heparin. 2D echo showed severely reduced LVEF <20% w/ global HK, GIIDD and small LV apical thrombus, moderate MR and RV mildly reduced. Subsequent R/LHC showed severe MVCAD (angiographic details below) and well compensated hemodynamics w/ low/normal filling pressures and preserved CO. RAP 1 mmHg, PCW 6 mmHg, CO 4.9 L/min, CI 2.5 L/min2. cMRI suggested extensive viability (except for apical septal wall and true apex) + Small LV thrombus. Underwent CABG x4 by Dr. Cliffton Asters (LIMA-LAD, SVG-D2, SVG-OM1, SVG-PDA). Post-operative course uncomplicated. GDMT initiated prior to d/c. Also of note, LDL was markedly elevated at 204 mg/dL. Was placed on high intensity statin, atorvastatin 80. Placed on Coumadin for LV thrombus.  He presents to clinic today for post hospital f/u. Here w/ his daughter and niece. Doing well. No CP. Sternal incision well healed. No drainage or signs of infection. Wt stable. No LEE. NYHA Class II. BP 126/72. HR 90. Had coumadin clinic visit this morning. INR threapeutic at 2.7. Denies abnormal bleeding. Of note, he had BMP done on 6/1 that showed mild hyperkalemia at 5.6 (ho hemolysis seen). SCr 1.16 (stable).  He does report sensation of orthopnea 3 nights ago and moved from bed to recliner. ReDs Clip 40%. Labs repeated in clinic today. K 4.1, SCr 1.16. LDL 155. Dig level pending.    Cardiac Studies    Echo 5/22  1. Left ventricular ejection fraction, by estimation, is <20%. The left ventricle has severely decreased function. The left ventricle demonstrates global hypokinesis. The left ventricular internal cavity size was mildly dilated. Left ventricular diastolic parameters are consistent with Grade II diastolic dysfunction (pseudonormalization). Small LV apical thrombus seen on Definity contrast study. 2. Right ventricular systolic function is mildly reduced. The right ventricular size is normal. There is normal pulmonary artery systolic pressure. 3. Left atrial size was mildly dilated. 4. Right atrial size was mildly dilated. 5. Mild thciekning of mitral valve leaflets. Moderate mitral valve regurgitation.  cMRI 5/22  IMPRESSION: 1. Severe LV enlargement with severe global hypokinesis with regional variation. EF 14%.   2.  Small apical LV thrombus.   3.  Normal RV size with mildly decreased systolic function, EF 36%.   4.  Moderate mitral regurgitation.   5. Based on severe LV dysfunction, there was less late gadolinium enhancement than I would have expected. With the exception of the apical septal wall and the true apex, would expect the remainder of the LV myocardium to be viable.  LHC 5/22   LM: Long vessel. Normal LAD: Prox 90% stenosis, followed by flush occlusion after Diag 1        No antegrade flow seen in LAD        Faint collaterals from RPL fill small caliber LAD        Diag 1 with prox tandem 95% stenoses LCx: Prox tandem 95% stenoses        Prox OM1 60%  stenosis        Mid LCx 60% stenosis RCA: Prox 90% stenosis, followed by 100% occlusion         Bridging collaterals faintly fill mid RCA, and reconstitute distal RCA         Prox RPDA 70% stenosis, followed by 50 stenosis   PA: 39/14 mmHg, mean PAP 23 mmHg PCW: 6 mmHg   S/p CABG x 4 LIMA-LAD, SVG-D2, SVG-OM1, SVG-PDA on 11/05/2020     Review of systems complete and found to be negative unless  listed in HPI.        Past Medical History:  Diagnosis Date   Anemia    Asthma    childhood   Blood in stool    Childhood asthma    GI bleed    Kidney laceration 1999   Seizures (HCC)     Current Outpatient Medications  Medication Sig Dispense Refill   aspirin EC 81 MG EC tablet Take 1 tablet (81 mg total) by mouth daily. Swallow whole. 30 tablet 11   atorvastatin (LIPITOR) 80 MG tablet Take 1 tablet (80 mg total) by mouth daily. 30 tablet 3   dapagliflozin propanediol (FARXIGA) 10 MG TABS tablet Take 1 tablet (10 mg total) by mouth daily. 30 tablet 3   digoxin (LANOXIN) 0.125 MG tablet Take 1 tablet (0.125 mg total) by mouth daily. 30 tablet 3   furosemide (LASIX) 20 MG tablet Take 1 tablet (20 mg total) by mouth daily as needed for fluid or edema. 45 tablet 3   Multiple Vitamin (MULTIVITAMIN WITH MINERALS) TABS tablet Take 1 tablet by mouth daily.     oxyCODONE (OXY IR/ROXICODONE) 5 MG immediate release tablet Take 1 tablet (5 mg total) by mouth every 4 (four) hours as needed for severe pain. 30 tablet 0   spironolactone (ALDACTONE) 25 MG tablet Take 1 tablet (25 mg total) by mouth daily. 30 tablet 3   warfarin (COUMADIN) 5 MG tablet Take 1 tablet (5 mg total) by mouth daily at 4 PM. (Patient taking differently: Take 5 mg by mouth daily at 4 PM. 5 mg, and 2.5 rotating as of right now.) 30 tablet 3   carvedilol (COREG) 3.125 MG tablet Take 1 tablet (3.125 mg total) by mouth 2 (two) times daily with a meal. 180 tablet 3   No current facility-administered medications for this encounter.    Allergies  Allergen Reactions   Penicillins Other (See Comments)    childhood      Social History   Socioeconomic History   Marital status: Widowed    Spouse name: Not on file   Number of children: Not on file   Years of education: Not on file   Highest education level: Not on file  Occupational History   Occupation: truck driver  Tobacco Use   Smoking status: Former    Pack  years: 0.00   Smokeless tobacco: Never   Tobacco comments:    quit in 90's  Substance and Sexual Activity   Alcohol use: Yes    Comment: 1 beer/month   Drug use: No   Sexual activity: Not on file  Other Topics Concern   Not on file  Social History Narrative   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     Social Determinants of Health   Financial Resource Strain: Low Risk    Difficulty of Paying Living Expenses: Not very hard  Food Insecurity: No Food Insecurity   Worried About Running Out of Food in the  Last Year: Never true   Ran Out of Food in the Last Year: Never true  Transportation Needs: No Transportation Needs   Lack of Transportation (Medical): No   Lack of Transportation (Non-Medical): No  Physical Activity: Not on file  Stress: Not on file  Social Connections: Not on file  Intimate Partner Violence: Not on file      Family History  Problem Relation Age of Onset   Cervical cancer Mother    Hypertension Father    Colon cancer Neg Hx    Pancreatic cancer Neg Hx    Esophageal cancer Neg Hx    Stomach cancer Neg Hx     Vitals:   12/05/20 0952  BP: 126/72  Pulse: 90  SpO2: 99%  Weight: 62.3 kg (137 lb 6.4 oz)     PHYSICAL EXAM: ReDS clip 40%  General:  Well appearing. No respiratory difficulty HEENT: normal Neck: supple. no JVD. Carotids 2+ bilat; no bruits. No lymphadenopathy or thyromegaly appreciated. Cor: PMI nondisplaced. Regular rate & rhythm. No rubs, gallops or murmurs. Sternal incision well healed  Lungs: clear Abdomen: soft, nontender, nondistended. No hepatosplenomegaly. No bruits or masses. Good bowel sounds. Extremities: no cyanosis, clubbing, rash, edema Neuro: alert & oriented x 3, cranial nerves grossly intact. moves all 4 extremities w/o difficulty. Affect pleasant.  ECG: not performed    ASSESSMENT & PLAN:  1. CAD:  - NSTEMi 5/22 Hs trop, 656>>746. Cath w/ severe MVCAD. S/p LIMA-LAD, SVG-D2, SVG-OM1, SVG-PDA  - stable w/o CP  - ASA,  statin, ? blocker + spiro  - has f/u w/ Dr. Cliffton Asters 6/20   2. Chronic Systolic Heart Failure: Ischemic CM w/ severe native vessel CAD. EF < 20%. RV mildly reduced. cMRI suggested extensive viability. Now s/p CABG. NYHA Class II. Volume status mildly elevated. ReDs clip 40%. BMP today: K 4.1, SCr 1.2 - Add Lasix 20 mg x 1 today then PRN going forward.  - Start Entresto 24-26 mg bid  - Continue Farxiga 10 mg daily  - Continue Spiro 25 mg daily  - Continue Digoxin 0.125 mg bid. Check Dig level today  - Continue Coreg 3.125 mg bid   - dicussed daily wts + low sodium diet and fluid restriction  - plan cardiac rehab once cleared by CT surgery  - continue gradual GDMT optimization and repeat echo in 3 months.  - if EF not improved >35%, will refer to EP for ICD  3. HLD: LDL 204 mg/dL on recent Lipid panel. LDL goal < 70 mg/dL. Has been on atorva 80 mg since 5/22 - repeat LP today shows improved but still elevated LDL at 155 mg/dL. HFTs normal - add Zeitia  10 mg daily  - repeat FLP + HFTs in 6 more weeks, if not at goal will place referral to Lipid Clinic for injectable PCSK9i vs  Leqvio  4. LV Thrombus: small thrombus noted on cMRI - on coumadin. Followed by [facility]  - INR checked today and therapeutic at 2.7. Denies abnormal bleeding    F/u  q3-4 weeks w/ APP/PharmD for further med titration until fully optimized. F/u echo + visit w/ Dr. Shirlee Latch in 3 months      Troy Lis, PA-C 12/05/20

## 2020-12-05 ENCOUNTER — Telehealth (HOSPITAL_COMMUNITY): Payer: Self-pay | Admitting: Cardiology

## 2020-12-05 ENCOUNTER — Ambulatory Visit (INDEPENDENT_AMBULATORY_CARE_PROVIDER_SITE_OTHER): Payer: Federal, State, Local not specified - PPO | Admitting: *Deleted

## 2020-12-05 ENCOUNTER — Other Ambulatory Visit: Payer: Self-pay

## 2020-12-05 ENCOUNTER — Encounter (HOSPITAL_COMMUNITY): Payer: Self-pay

## 2020-12-05 ENCOUNTER — Ambulatory Visit (HOSPITAL_COMMUNITY)
Admit: 2020-12-05 | Discharge: 2020-12-05 | Disposition: A | Discharge status: Home / Self Care | Payer: Federal, State, Local not specified - PPO | Attending: Cardiology | Admitting: Cardiology

## 2020-12-05 VITALS — BP 126/72 | HR 90 | Wt 137.4 lb

## 2020-12-05 DIAGNOSIS — I2510 Atherosclerotic heart disease of native coronary artery without angina pectoris: Secondary | ICD-10-CM | POA: Insufficient documentation

## 2020-12-05 DIAGNOSIS — I252 Old myocardial infarction: Secondary | ICD-10-CM | POA: Insufficient documentation

## 2020-12-05 DIAGNOSIS — Z7982 Long term (current) use of aspirin: Secondary | ICD-10-CM | POA: Insufficient documentation

## 2020-12-05 DIAGNOSIS — Z8249 Family history of ischemic heart disease and other diseases of the circulatory system: Secondary | ICD-10-CM | POA: Insufficient documentation

## 2020-12-05 DIAGNOSIS — Z7901 Long term (current) use of anticoagulants: Secondary | ICD-10-CM | POA: Diagnosis not present

## 2020-12-05 DIAGNOSIS — E875 Hyperkalemia: Secondary | ICD-10-CM | POA: Insufficient documentation

## 2020-12-05 DIAGNOSIS — I255 Ischemic cardiomyopathy: Secondary | ICD-10-CM | POA: Diagnosis not present

## 2020-12-05 DIAGNOSIS — Z79899 Other long term (current) drug therapy: Secondary | ICD-10-CM | POA: Insufficient documentation

## 2020-12-05 DIAGNOSIS — E782 Mixed hyperlipidemia: Secondary | ICD-10-CM

## 2020-12-05 DIAGNOSIS — E785 Hyperlipidemia, unspecified: Secondary | ICD-10-CM | POA: Insufficient documentation

## 2020-12-05 DIAGNOSIS — Z87891 Personal history of nicotine dependence: Secondary | ICD-10-CM | POA: Diagnosis not present

## 2020-12-05 DIAGNOSIS — Z5181 Encounter for therapeutic drug level monitoring: Secondary | ICD-10-CM | POA: Diagnosis not present

## 2020-12-05 DIAGNOSIS — I5022 Chronic systolic (congestive) heart failure: Secondary | ICD-10-CM | POA: Insufficient documentation

## 2020-12-05 DIAGNOSIS — Z951 Presence of aortocoronary bypass graft: Secondary | ICD-10-CM | POA: Diagnosis not present

## 2020-12-05 DIAGNOSIS — I5020 Unspecified systolic (congestive) heart failure: Secondary | ICD-10-CM | POA: Diagnosis present

## 2020-12-05 DIAGNOSIS — I340 Nonrheumatic mitral (valve) insufficiency: Secondary | ICD-10-CM | POA: Insufficient documentation

## 2020-12-05 LAB — COMPREHENSIVE METABOLIC PANEL
ALT: 37 U/L (ref 0–44)
AST: 27 U/L (ref 15–41)
Albumin: 3.8 g/dL (ref 3.5–5.0)
Alkaline Phosphatase: 90 U/L (ref 38–126)
Anion gap: 10 (ref 5–15)
BUN: 22 mg/dL (ref 8–23)
CO2: 23 mmol/L (ref 22–32)
Calcium: 9.7 mg/dL (ref 8.9–10.3)
Chloride: 102 mmol/L (ref 98–111)
Creatinine, Ser: 1.16 mg/dL (ref 0.61–1.24)
GFR, Estimated: >60 mL/min (ref >60)
Glucose, Bld: 118 mg/dL — ABNORMAL HIGH (ref 70–99)
Potassium: 4.1 mmol/L (ref 3.5–5.1)
Sodium: 135 mmol/L (ref 135–145)
Total Bilirubin: 0.6 mg/dL (ref 0.3–1.2)
Total Protein: 7.4 g/dL (ref 6.5–8.1)

## 2020-12-05 LAB — LIPID PANEL
Cholesterol: 206 mg/dL — ABNORMAL HIGH (ref 0–200)
HDL: 39 mg/dL — ABNORMAL LOW (ref >40)
LDL Cholesterol: 155 mg/dL — ABNORMAL HIGH (ref 0–99)
Total CHOL/HDL Ratio: 5.3 RATIO
Triglycerides: 62 mg/dL (ref <150)
VLDL: 12 mg/dL (ref 0–40)

## 2020-12-05 LAB — POCT INR: INR: 2.7 (ref 2.0–3.0)

## 2020-12-05 LAB — DIGOXIN LEVEL: Digoxin Level: 1 ng/mL (ref 0.8–2.0)

## 2020-12-05 MED ORDER — CARVEDILOL 12.5 MG PO TABS: 12.5000 mg | ORAL_TABLET | Freq: Two times a day (BID) | ORAL | 3 refills | Status: DC

## 2020-12-05 MED ORDER — ENTRESTO 24-26 MG PO TABS: 1.0000 | ORAL_TABLET | Freq: Two times a day (BID) | ORAL | 11 refills | Status: DC

## 2020-12-05 MED ORDER — CARVEDILOL 3.125 MG PO TABS: 3.1250 mg | ORAL_TABLET | Freq: Two times a day (BID) | ORAL | 3 refills | Status: DC

## 2020-12-05 MED ORDER — FUROSEMIDE 20 MG PO TABS: 20.0000 mg | ORAL_TABLET | Freq: Every day | ORAL | 3 refills | Status: DC | PRN

## 2020-12-05 MED ORDER — EZETIMIBE 10 MG PO TABS: 10.0000 mg | ORAL_TABLET | Freq: Every day | ORAL | 3 refills | Status: DC

## 2020-12-05 NOTE — Patient Instructions (Signed)
Description   Continue taking warfarin 1 tablet daily excpet for 1/2 a tablet on Tuesday, Thursday and Saturday. Recheck INR in 1 week. Coumadin Clinic [phone].  Call if any signs of abnormal bleeding.

## 2020-12-05 NOTE — Telephone Encounter (Signed)
-----   Message from Nikolai, [place] sent at 12/05/2020  1:40 PM EDT ----- Kidney function and potassium normal. Start Entresto 24-26 mg bid and take  20 mg tablet of Lasix x 1 today, then take lasix only PRN going forward. Repeat BMP in 1 week.   LDL (bad cholesterol) is still elevated. Continue Atorvastatin and add Zetia 10 mg daily. Repeat FLP and HFTs in 6 weeks.

## 2020-12-05 NOTE — Telephone Encounter (Signed)
Patient called.  Unable to reach patient. No answer unable to leave message  [phone] (M)   Pt aware via niece Meds sent to pharmacy-repeat labs at [facility] placed

## 2020-12-05 NOTE — Patient Instructions (Addendum)
Labs done today. We will contact you only if your labs are abnormal.  START Lasix 20mg  (1 tablet) by mouth daily as needed for swelling or a weight gain of 3lbs or more in 24hours.   No other medication changes were made. Please continue all current medications as prescribed.  Your physician recommends that you schedule a follow-up appointment in: 4 weeks   If you have any questions or concerns before your next appointment please send a message through [place] or call our office at [phone].    TO LEAVE A MESSAGE FOR THE NURSE SELECT OPTION 2, PLEASE LEAVE A MESSAGE INCLUDING: YOUR NAME DATE OF BIRTH CALL BACK NUMBER REASON FOR CALL**this is important as we prioritize the call backs  YOU WILL RECEIVE A CALL BACK THE SAME DAY AS LONG AS YOU CALL BEFORE 4:00 PM   Do the following things EVERYDAY: Weigh yourself in the morning before breakfast. Write it down and keep it in a log. Take your medicines as prescribed Eat low salt foods--Limit salt (sodium) to 2000 mg per day.  Stay as active as you can everyday Limit all fluids for the day to less than 2 liters   At the Advanced [facility] Failure Clinic, you and your health needs are our priority. As part of our continuing mission to provide you with exceptional heart care, we have created designated Provider Care Teams. These Care Teams include your primary Cardiologist (physician) and Advanced Practice Providers (APPs- Physician Assistants and Nurse Practitioners) who all work together to provide you with the care you need, when you need it.   You may see any of the following providers on your designated Care Team at your next follow up: Dr [phone] Dr Arvilla Meres, NP Carron Curie, Robbie Lis [place], PharmD   Please be sure to bring in all your medications bottles to every appointment.

## 2020-12-05 NOTE — Telephone Encounter (Signed)
Returned call to patient no answer/ no vm set up

## 2020-12-05 NOTE — Telephone Encounter (Signed)
Pt needs medication consult regarding Entresto, please advise

## 2020-12-12 ENCOUNTER — Ambulatory Visit (INDEPENDENT_AMBULATORY_CARE_PROVIDER_SITE_OTHER): Payer: Federal, State, Local not specified - PPO | Admitting: Pharmacist

## 2020-12-12 ENCOUNTER — Other Ambulatory Visit: Payer: Self-pay | Admitting: Thoracic Surgery (Cardiothoracic Vascular Surgery)

## 2020-12-12 ENCOUNTER — Other Ambulatory Visit (HOSPITAL_COMMUNITY): Payer: Self-pay

## 2020-12-12 ENCOUNTER — Encounter: Payer: Self-pay | Admitting: Pharmacist

## 2020-12-12 ENCOUNTER — Ambulatory Visit (HOSPITAL_COMMUNITY)
Admission: RE | Admit: 2020-12-12 | Discharge: 2020-12-12 | Disposition: A | Discharge status: Home / Self Care | Payer: Federal, State, Local not specified - PPO | Source: Ambulatory Visit | Attending: Internal Medicine | Admitting: Internal Medicine

## 2020-12-12 ENCOUNTER — Other Ambulatory Visit: Payer: Self-pay

## 2020-12-12 DIAGNOSIS — I5020 Unspecified systolic (congestive) heart failure: Secondary | ICD-10-CM | POA: Insufficient documentation

## 2020-12-12 DIAGNOSIS — I513 Intracardiac thrombosis, not elsewhere classified: Secondary | ICD-10-CM | POA: Diagnosis not present

## 2020-12-12 DIAGNOSIS — Z951 Presence of aortocoronary bypass graft: Secondary | ICD-10-CM

## 2020-12-12 DIAGNOSIS — Z5181 Encounter for therapeutic drug level monitoring: Secondary | ICD-10-CM | POA: Diagnosis not present

## 2020-12-12 LAB — POCT INR: INR: 3 (ref 2.0–3.0)

## 2020-12-12 LAB — BASIC METABOLIC PANEL
Anion gap: 6 (ref 5–15)
BUN: 27 mg/dL — ABNORMAL HIGH (ref 8–23)
CO2: 26 mmol/L (ref 22–32)
Calcium: 9.3 mg/dL (ref 8.9–10.3)
Chloride: 103 mmol/L (ref 98–111)
Creatinine, Ser: 1.26 mg/dL — ABNORMAL HIGH (ref 0.61–1.24)
GFR, Estimated: >60 mL/min (ref >60)
Glucose, Bld: 99 mg/dL (ref 70–99)
Potassium: 4.8 mmol/L (ref 3.5–5.1)
Sodium: 135 mmol/L (ref 135–145)

## 2020-12-12 NOTE — Patient Instructions (Signed)
Description   Continue taking warfarin 1 tablet daily excpet for 1/2 a tablet on Tuesday, Thursday and Saturday. Recheck INR in 10 days. Coumadin Clinic [phone].  Call if any signs of abnormal bleeding.

## 2020-12-16 ENCOUNTER — Ambulatory Visit (INDEPENDENT_AMBULATORY_CARE_PROVIDER_SITE_OTHER): Payer: Self-pay | Admitting: Thoracic Surgery (Cardiothoracic Vascular Surgery)

## 2020-12-16 ENCOUNTER — Telehealth: Payer: Self-pay | Admitting: Family Medicine

## 2020-12-16 ENCOUNTER — Ambulatory Visit
Admission: RE | Admit: 2020-12-16 | Discharge: 2020-12-16 | Disposition: A | Discharge status: Home / Self Care | Payer: Federal, State, Local not specified - PPO | Source: Ambulatory Visit | Attending: Thoracic Surgery (Cardiothoracic Vascular Surgery) | Admitting: Thoracic Surgery (Cardiothoracic Vascular Surgery)

## 2020-12-16 ENCOUNTER — Other Ambulatory Visit: Payer: Self-pay

## 2020-12-16 VITALS — BP 96/61 | HR 62 | Resp 20 | Ht 72.0 in | Wt 145.0 lb

## 2020-12-16 DIAGNOSIS — Z951 Presence of aortocoronary bypass graft: Secondary | ICD-10-CM

## 2020-12-16 NOTE — Progress Notes (Signed)
      [address]       [place] [address]             [phone]        [facility] Medical Record #[id number] Date of Birth: 06/29/1951  Referring: Elder Negus, MD Primary Care: Copland, Gwenlyn Found, MD Primary Cardiologist:None  Reason for visit:   follow-up  History of Present Illness:     Mr. Withers comes in for his 1 month appointment.  Overall he is doing well.  He has some mild incisional discomfort.  He has been very strict with his diet and salt intake.  He has no complaints today.  Physical Exam: BP 96/61   Pulse 62   Resp 20   Ht 6' (1.829 m)   Wt 145 lb (65.8 kg)   SpO2 98% Comment: RA  BMI 19.67 kg/m   Alert NAD Incision clean.  Sternum stable Abdomen soft, ND No peripheral edema   Diagnostic Studies & Laboratory data: CXR: Clear   Assessment / Plan:   70 year old male with history of congestive heart failure with an EF of 15%, and LV thrombus, and coronary artery disease.  He is status post CABG, and doing well. He is cleared to start cardiac rehab. He will follow-up with heart failure. Return to surgical clinic as needed.    Corliss Skains 12/16/2020 1:10 PM

## 2020-12-16 NOTE — Telephone Encounter (Signed)
Patient dropped of DMV form to be filled out by copland  Patient would like to called whenever the paper is ready to be picked up [phone]  Placed into Copland folder up front

## 2020-12-18 NOTE — Telephone Encounter (Signed)
Pt called and notified of form being completed. Form placed at the front.

## 2020-12-20 ENCOUNTER — Encounter: Payer: Federal, State, Local not specified - PPO | Admitting: Thoracic Surgery (Cardiothoracic Vascular Surgery)

## 2020-12-20 ENCOUNTER — Encounter (HOSPITAL_COMMUNITY): Payer: Self-pay | Admitting: Cardiology

## 2020-12-24 ENCOUNTER — Telehealth (HOSPITAL_COMMUNITY): Payer: Self-pay | Admitting: Pharmacy Technician

## 2020-12-24 ENCOUNTER — Other Ambulatory Visit (HOSPITAL_COMMUNITY): Payer: Self-pay

## 2020-12-24 NOTE — Telephone Encounter (Signed)
Patient Advocate Encounter   Received notification from [employer] that prior authorization for Troy Johnson is required.   PA submitted on CoverMyMeds Key [id number] Status is pending   Will continue to follow.

## 2020-12-24 NOTE — Progress Notes (Signed)
Advanced Heart Failure Clinic Note   PCP: Copland, Gwenlyn Found, MD PCP-Cardiologist:  Dr. Joaquim Nam  AHFC: Dr. Shirlee Latch  CT Surgeon: Dr. Cliffton Asters   Reason for Visit: F/u for Systolic Heart Failure 2/2 ICM s/p CABG   HPI:  70 y/o AAM w/ h/o anemia, ?prior seizure, former smoker (quit 70 y/o), HLD and newly diagnosed ischemic heart disease/ systolic heart failure.   Admitted to [facility] 5/22 w/ exertional dyspnea and found to be in acute CHF and ruled in for NSTEMi, Hs trop, 656>>746. EKG showed inferolateral TWI. Diuresed w/ IV lasix and placed on IV heparin. Echo showed severely reduced LVEF <20% w/ global HK, GIIDD and small LV apical thrombus, moderate MR and RV mildly reduced. R/LHC showed severe MVCAD (angiographic details below) and well compensated hemodynamics w/ low/normal filling pressures and preserved CO. RAP 1 mmHg, PCW 6 mmHg, CO 4.9 L/min, CI 2.5 L/min2. cMRI suggested extensive viability (except for apical septal wall and true apex) + small LV thrombus.   Underwent CABG x4 by Dr. Cliffton Asters (LIMA-LAD, SVG-D2, SVG-OM1, SVG-PDA). Post-operative course uncomplicated. GDMT initiated prior to d/c. Also of note, LDL was markedly elevated at 204 mg/dL. Was placed on high intensity statin &  Coumadin for LV thrombus.  He presented to clinic 6/22 for post hospital f/u w/ his daughter and niece. Stable NYHA II symptoms. REDs was elevated at 40%, Entresto started and instructed to take lasix for 1 day.   Today he returns for HF follow up. Overall feeling fine, some SOB with stairs. Some dizziness with position changes. Denies increasing SOB, CP, edema, or PND/Orthopnea. Appetite ok. No fever or chills. Weight at home 139-140 pounds. Taking all medications. He is eating more as his appetite is better now, but his daughter and niece are monitoring his sodium intake.  Cardiac Studies   - Echo (5/22): EF <20%, LV severely decreased, Grade II DD, small LV apical thrombus, RV mildly reduced.  - cMRI  (5/22): Severe LV enlargement with severe global hypokinesis with regional variation. EF 14%, small apical LV thrombus, normal RV size with mildly decreased systolic function, EF 36%, moderate MR, Based on severe LV dysfunction, there was less late gadolinium enhancement than I would have expected. With the exception of the apical septal wall and the true apex, would expect the remainder of the LV myocardium to be viable.  - LHC (5/22):  LM: Long vessel. Normal LAD: Prox 90% stenosis, followed by flush occlusion after Diag 1        No antegrade flow seen in LAD        Faint collaterals from RPL fill small caliber LAD        Diag 1 with prox tandem 95% stenoses LCx: Prox tandem 95% stenoses        Prox OM1 60% stenosis        Mid LCx 60% stenosis RCA: Prox 90% stenosis, followed by 100% occlusion         Bridging collaterals faintly fill mid RCA, and reconstitute distal RCA         Prox RPDA 70% stenosis, followed by 50 stenosis   PA: 39/14 mmHg, mean PAP 23 mmHg PCW: 6 mmHg  - S/p CABG x 4 LIMA-LAD, SVG-D2, SVG-OM1, SVG-PDA on 11/05/2020   Review of systems complete and found to be negative unless listed in HPI.     Past Medical History:  Diagnosis Date   Anemia    Asthma    childhood   Blood in stool  Childhood asthma    GI bleed    Kidney laceration 1999   Seizures (HCC)    Current Outpatient Medications  Medication Sig Dispense Refill   aspirin EC 81 MG EC tablet Take 1 tablet (81 mg total) by mouth daily. Swallow whole. 30 tablet 11   atorvastatin (LIPITOR) 80 MG tablet Take 1 tablet (80 mg total) by mouth daily. 30 tablet 3   carvedilol (COREG) 3.125 MG tablet Take 1 tablet (3.125 mg total) by mouth 2 (two) times daily with a meal. 60 tablet 3   dapagliflozin propanediol (FARXIGA) 10 MG TABS tablet Take 1 tablet (10 mg total) by mouth daily. 30 tablet 3   digoxin (LANOXIN) 0.125 MG tablet Take 1 tablet (0.125 mg total) by mouth daily. 30 tablet 3   ezetimibe (ZETIA) 10  MG tablet Take 1 tablet (10 mg total) by mouth daily. 90 tablet 3   furosemide (LASIX) 20 MG tablet Take 1 tablet (20 mg total) by mouth daily as needed for fluid or edema. 45 tablet 3   Multiple Vitamin (MULTIVITAMIN WITH MINERALS) TABS tablet Take 1 tablet by mouth daily.     oxyCODONE (OXY IR/ROXICODONE) 5 MG immediate release tablet Take 1 tablet (5 mg total) by mouth every 4 (four) hours as needed for severe pain. 30 tablet 0   sacubitril-valsartan (ENTRESTO) 24-26 MG Take 1 tablet by mouth 2 (two) times daily. 60 tablet 11   spironolactone (ALDACTONE) 25 MG tablet Take 1 tablet (25 mg total) by mouth daily. 30 tablet 3   warfarin (COUMADIN) 5 MG tablet Take 1 tablet (5 mg total) by mouth daily at 4 PM. (Patient taking differently: Take 5 mg by mouth daily at 4 PM. 5 mg, and 2.5 rotating as of right now.) 30 tablet 3   No current facility-administered medications for this encounter.   Allergies  Allergen Reactions   Penicillins Other (See Comments)    childhood   Social History   Socioeconomic History   Marital status: Widowed    Spouse name: Not on file   Number of children: Not on file   Years of education: Not on file   Highest education level: Not on file  Occupational History   Occupation: truck driver  Tobacco Use   Smoking status: Former    Pack years: 0.00   Smokeless tobacco: Never   Tobacco comments:    quit in 90's  Substance and Sexual Activity   Alcohol use: Yes    Comment: 1 beer/month   Drug use: No   Sexual activity: Not on file  Other Topics Concern   Not on file  Social History Narrative   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     Social Determinants of Health   Financial Resource Strain: Low Risk    Difficulty of Paying Living Expenses: Not very hard  Food Insecurity: No Food Insecurity   Worried About Programme researcher, broadcasting/film/video in the Last Year: Never true   Ran Out of Food in the Last Year: Never true  Transportation Needs: No Transportation Needs    Lack of Transportation (Medical): No   Lack of Transportation (Non-Medical): No  Physical Activity: Not on file  Stress: Not on file  Social Connections: Not on file  Intimate Partner Violence: Not on file   Family History  Problem Relation Age of Onset   Cervical cancer Mother    Hypertension Father    Colon cancer Neg Hx    Pancreatic cancer Neg Hx  Esophageal cancer Neg Hx    Stomach cancer Neg Hx    BP (!) 98/50   Pulse 82   Wt 67.3 kg (148 lb 6.4 oz)   SpO2 99%   BMI 20.13 kg/m   Wt Readings from Last 3 Encounters:  12/25/20 67.3 kg (148 lb 6.4 oz)  12/16/20 65.8 kg (145 lb)  12/05/20 62.3 kg (137 lb 6.4 oz)   PHYSICAL EXAM: ReDS clip 29%  General:  NAD. No resp difficulty, thin HEENT: Normal Neck: Supple. No JVD. Carotids 2+ bilat; no bruits. No lymphadenopathy or thryomegaly appreciated. Cor: PMI nondisplaced. Regular rate & rhythm, extra beats heard. No rubs, gallops or murmurs. Lungs: Clear,sternal incision well-healed. Abdomen: Soft, nontender, nondistended. No hepatosplenomegaly. No bruits or masses. Good bowel sounds. Extremities: No cyanosis, clubbing, rash, edema Neuro: Alert & oriented x 3, cranial nerves grossly intact. Moves all 4 extremities w/o difficulty. Affect pleasant.  ECG: SR with PVCs 87 bpm, qrs 98 ms (personally reviewed).  ASSESSMENT & PLAN:  1. CAD:  - NSTEMi 5/22. Cath w/ severe MVCAD. S/p LIMA-LAD, SVG-D2, SVG-OM1, SVG-PDA  - Stable w/o CP.  - ASA, statin, ? blocker + spiro  - Cleared by Dr. Cliffton Asters to start CR.  2. Chronic Systolic Heart Failure: Ischemic CM w/ severe native vessel CAD. EF < 20%. RV mildly reduced. cMRI suggested extensive viability. Now s/p CABG. NYHA Class II. Volume status stable. ReDs clip 29%. Weight is up from previous appts, but today's weight is consistent with his discharge weight of 145 lbs. - Continue Lasix 20 mg prn. - Continue Entresto 24-26 mg bid. BMET today. - Continue Farxiga 10 mg daily.  -  Continue spiro 25 mg daily.  - Continue Digoxin 0.125 mg daily. Dig level today, recently elevated. - Continue Coreg 3.125 mg bid.   - Dicussed daily wts + low sodium diet and fluid restriction.  - Has not heard from CR, per chart review, they attempted to call and send letter. Gave number to niece today to follow up getting him started with [facility] in [facility]. - Continue gradual GDMT optimization and repeat echo in 3 months.  - If EF not improved >35%, will refer to EP for ICD. QRS narrow. - No BP room today to increase medications. He will check his BP at home 3-4x/week and bring to next appt.   3. HLD: LDL 204 mg/dL on recent Lipid panel. LDL goal < 70 mg/dL. Has been on atorva 80 mg since 5/22 - LDL at 155 mg/dL. HFTs normal (6/22). - Continue Zeitia  10 mg daily. - Repeat FLP + HFTs at next visit, if not at goal will place referral to Lipid Clinic for injectable PCSK9i vs Leqvio.  4. LV Thrombus: small thrombus noted on cMRI - On Coumadin. Followed by [facility].  - Denies abnormal bleeding.   F/u  q 3-4 weeks w/ APP/PharmD for further med titration until fully optimized. F/u echo + visit w/ Dr. Shirlee Latch in 3 months.    Anderson Malta Rupert, FNP 12/25/20

## 2020-12-24 NOTE — Telephone Encounter (Signed)
Advanced Heart Failure Patient Advocate Encounter  Prior Authorization for Sherryll Burger has been approved.    PA#  [id number] Effective dates: 12/24/20 through 12/24/22  Patients co-pay is $298.68 (30 days)   $10 co-pay card obtained. Information sent to patient's cell number and email.   BIN [id number] PCN [facility] [id number] ID [id number]  Archer Asa, CPhT

## 2020-12-25 ENCOUNTER — Ambulatory Visit: Payer: Federal, State, Local not specified - PPO | Admitting: *Deleted

## 2020-12-25 ENCOUNTER — Encounter (HOSPITAL_COMMUNITY): Payer: Self-pay

## 2020-12-25 ENCOUNTER — Ambulatory Visit (HOSPITAL_COMMUNITY)
Admission: RE | Admit: 2020-12-25 | Discharge: 2020-12-25 | Disposition: A | Discharge status: Home / Self Care | Payer: Federal, State, Local not specified - PPO | Source: Ambulatory Visit | Attending: Family Medicine | Admitting: Family Medicine

## 2020-12-25 ENCOUNTER — Other Ambulatory Visit: Payer: Self-pay

## 2020-12-25 VITALS — BP 98/50 | HR 82 | Wt 148.4 lb

## 2020-12-25 DIAGNOSIS — Z7901 Long term (current) use of anticoagulants: Secondary | ICD-10-CM | POA: Insufficient documentation

## 2020-12-25 DIAGNOSIS — Z5181 Encounter for therapeutic drug level monitoring: Secondary | ICD-10-CM | POA: Diagnosis not present

## 2020-12-25 DIAGNOSIS — I2510 Atherosclerotic heart disease of native coronary artery without angina pectoris: Secondary | ICD-10-CM | POA: Diagnosis not present

## 2020-12-25 DIAGNOSIS — Z7984 Long term (current) use of oral hypoglycemic drugs: Secondary | ICD-10-CM | POA: Insufficient documentation

## 2020-12-25 DIAGNOSIS — Z880 Allergy status to penicillin: Secondary | ICD-10-CM | POA: Insufficient documentation

## 2020-12-25 DIAGNOSIS — I5022 Chronic systolic (congestive) heart failure: Secondary | ICD-10-CM | POA: Insufficient documentation

## 2020-12-25 DIAGNOSIS — Z7982 Long term (current) use of aspirin: Secondary | ICD-10-CM | POA: Insufficient documentation

## 2020-12-25 DIAGNOSIS — Z87891 Personal history of nicotine dependence: Secondary | ICD-10-CM | POA: Diagnosis not present

## 2020-12-25 DIAGNOSIS — Z951 Presence of aortocoronary bypass graft: Secondary | ICD-10-CM | POA: Diagnosis not present

## 2020-12-25 DIAGNOSIS — I255 Ischemic cardiomyopathy: Secondary | ICD-10-CM | POA: Diagnosis not present

## 2020-12-25 DIAGNOSIS — I252 Old myocardial infarction: Secondary | ICD-10-CM | POA: Insufficient documentation

## 2020-12-25 DIAGNOSIS — E785 Hyperlipidemia, unspecified: Secondary | ICD-10-CM | POA: Insufficient documentation

## 2020-12-25 DIAGNOSIS — Z8249 Family history of ischemic heart disease and other diseases of the circulatory system: Secondary | ICD-10-CM | POA: Diagnosis not present

## 2020-12-25 DIAGNOSIS — E782 Mixed hyperlipidemia: Secondary | ICD-10-CM

## 2020-12-25 DIAGNOSIS — I5020 Unspecified systolic (congestive) heart failure: Secondary | ICD-10-CM | POA: Diagnosis not present

## 2020-12-25 DIAGNOSIS — Z79899 Other long term (current) drug therapy: Secondary | ICD-10-CM | POA: Diagnosis not present

## 2020-12-25 DIAGNOSIS — I513 Intracardiac thrombosis, not elsewhere classified: Secondary | ICD-10-CM

## 2020-12-25 LAB — BASIC METABOLIC PANEL
Anion gap: 5 (ref 5–15)
BUN: 21 mg/dL (ref 8–23)
CO2: 26 mmol/L (ref 22–32)
Calcium: 9.2 mg/dL (ref 8.9–10.3)
Chloride: 106 mmol/L (ref 98–111)
Creatinine, Ser: 1.29 mg/dL — ABNORMAL HIGH (ref 0.61–1.24)
GFR, Estimated: >60 mL/min (ref >60)
Glucose, Bld: 99 mg/dL (ref 70–99)
Potassium: 3.9 mmol/L (ref 3.5–5.1)
Sodium: 137 mmol/L (ref 135–145)

## 2020-12-25 LAB — DIGOXIN LEVEL: Digoxin Level: 0.7 ng/mL — ABNORMAL LOW (ref 0.8–2.0)

## 2020-12-25 LAB — POCT INR: INR: 2.2 (ref 2.0–3.0)

## 2020-12-25 NOTE — Progress Notes (Signed)
ReDS Vest / Clip - 12/25/20 0800       ReDS Vest / Clip   Station Marker C    Ruler Value 23    ReDS Value Range Low volume    ReDS Actual Value 29

## 2020-12-25 NOTE — Patient Instructions (Signed)
Description   Continue taking warfarin 1 tablet daily except for 1/2 tablet on Tuesday, Thursday and Saturday. Recheck INR in 2 weeks. [facility]in [facility] [phone].  Call if any signs of abnormal bleeding.

## 2020-12-25 NOTE — Patient Instructions (Addendum)
No medication changes today  Labs done today, your results will be available in MyChart, we will contact you for abnormal readings.  Your physician recommends that you schedule a follow-up appointment in: 3- 4 weeks  You have been referred to cardiac rehab,they will call you to schedule an appointment   If you have any questions or concerns before your next appointment please send [place] a message through [place] or call our office at [phone].    TO LEAVE A MESSAGE FOR THE NURSE SELECT OPTION 2, PLEASE LEAVE A MESSAGE INCLUDING: YOUR NAME DATE OF BIRTH CALL BACK NUMBER REASON FOR CALL**this is important as we prioritize the call backs  YOU WILL RECEIVE A CALL BACK THE SAME DAY AS LONG AS YOU CALL BEFORE 4:00 PM  At the [facility], you and your health needs are our priority. As part of our continuing mission to provide you with exceptional heart care, we have created designated Provider Care Teams. These Care Teams include your primary Cardiologist (physician) and Advanced Practice Providers (APPs- Physician Assistants and Nurse Practitioners) who all work together to provide you with the care you need, when you need it.   You may see any of the following providers on your designated Care Team at your next follow up: Dr Arvilla Meres Dr Marca Ancona Dr Brandon Melnick, NP Robbie Lis, [place] Mikki Santee Karle Plumber, PharmD   Please be sure to bring in all your medications bottles to every appointment.

## 2020-12-31 ENCOUNTER — Telehealth (HOSPITAL_COMMUNITY): Payer: Self-pay | Admitting: *Deleted

## 2020-12-31 NOTE — Telephone Encounter (Signed)
Pts niece called to follow up on cardiac rehab referral. I told her referral was placed on 6/29 and she asked for the phone number to cardiac rehab to call and schedule appointment for pt. I gave her the phone number and she thanked me for the call.

## 2021-01-01 ENCOUNTER — Other Ambulatory Visit (HOSPITAL_COMMUNITY): Payer: Self-pay | Admitting: Cardiology

## 2021-01-02 ENCOUNTER — Telehealth (HOSPITAL_COMMUNITY): Payer: Self-pay | Admitting: Cardiology

## 2021-01-02 NOTE — Telephone Encounter (Signed)
With $10/co pay card the co pay will no longer cost $300. Pts niece aware of savings card and instructions on how to use card. Advised to call back with any questions  Pa complete. Change in medication is not needed at this time

## 2021-01-02 NOTE — Telephone Encounter (Signed)
Opened in error

## 2021-01-07 ENCOUNTER — Other Ambulatory Visit: Payer: Self-pay

## 2021-01-07 ENCOUNTER — Ambulatory Visit (INDEPENDENT_AMBULATORY_CARE_PROVIDER_SITE_OTHER): Payer: Federal, State, Local not specified - PPO

## 2021-01-07 DIAGNOSIS — Z5181 Encounter for therapeutic drug level monitoring: Secondary | ICD-10-CM

## 2021-01-07 DIAGNOSIS — I513 Intracardiac thrombosis, not elsewhere classified: Secondary | ICD-10-CM

## 2021-01-07 LAB — POCT INR: INR: 2.1 (ref 2.0–3.0)

## 2021-01-07 NOTE — Patient Instructions (Signed)
Description   Continue taking warfarin 1 tablet daily except for 1/2 tablet on Tuesdays, Thursdays and Saturdays. Recheck INR in 3 weeks. Coumadin Clinic [phone].  Call if any signs of abnormal bleeding.

## 2021-01-09 ENCOUNTER — Telehealth (HOSPITAL_COMMUNITY): Payer: Self-pay

## 2021-01-09 NOTE — Telephone Encounter (Signed)
Received a fax requesting medical referral form from [facility]. Form was successfully faxed to: [phone] ,which was the number provided..  Referral form will be scanned into patients chart.  

## 2021-01-12 IMAGING — MR MR HEAD WO/W CM
10 series · 48 of 48 positions shown · IV contrast (GADAVIST)
Comparison: None.

CLINICAL DATA: 68-year-old male with syncopal episode 4-5 weeks
ago.

EXAM:
MRI HEAD WITHOUT AND WITH CONTRAST
TECHNIQUE: Multiplanar, multiecho pulse sequences of the brain and surrounding
structures were obtained without and with intravenous contrast.
CONTRAST:  7.5mL GADAVIST GADOBUTROL 1 MMOL/ML IV SOLN

[Series 2: T1 · sagittal · 5.0mm · 0.90mm/px · 1 of 27 slices shown (1 of 2)]
[im 1/27]
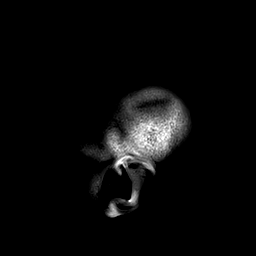

[Series 3: DWI · axial · 3.0mm · 1.88mm/px · z∈[-21,+124]mm · 9 of 96 slices shown (1 of 2)]
[im 1/96]
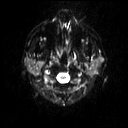
[im 12/96]
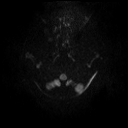
[im 24/96]
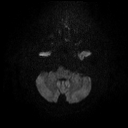
[im 36/96]
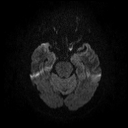
[im 48/96]
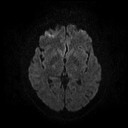
[im 60/96]
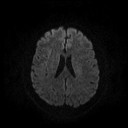
[im 72/96]
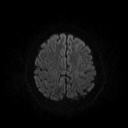
[im 84/96]
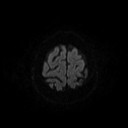
[im 96/96]
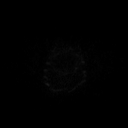

[Series 4: DWI · axial · 3.0mm · 1.88mm/px · z∈[-21,+124]mm · 4 of 47 slices shown (2 of 2)]
[im 1/47]
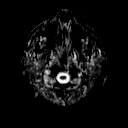
[im 16/47]
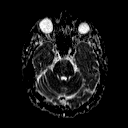
[im 31/47]
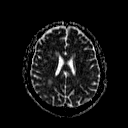
[im 47/47]
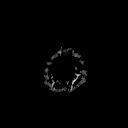

[Series 5: T2 · axial · 5.0mm · 0.69mm/px · z∈[-28,+123]mm · 3 of 28 slices shown (1 of 3)]
[im 1/28]
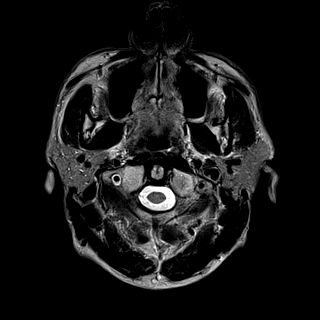
[im 14/28]
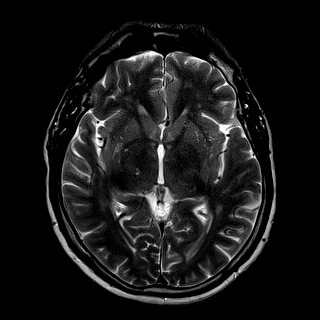
[im 28/28]
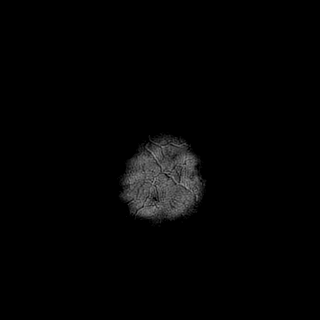

[Series 6: T2 · axial · 5.0mm · 0.43mm/px · z∈[-28,+123]mm · 3 of 28 slices shown (2 of 3)]
[im 1/28]
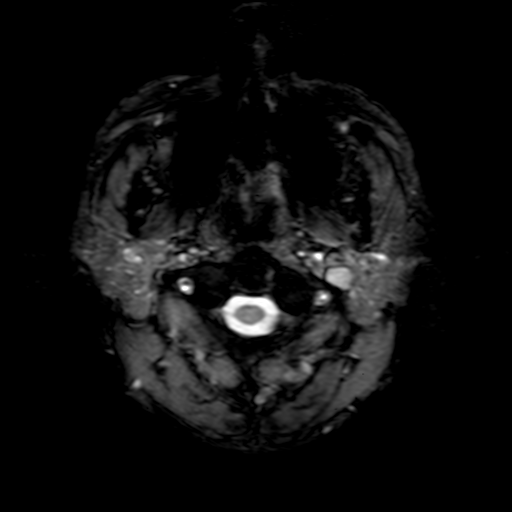
[im 14/28]
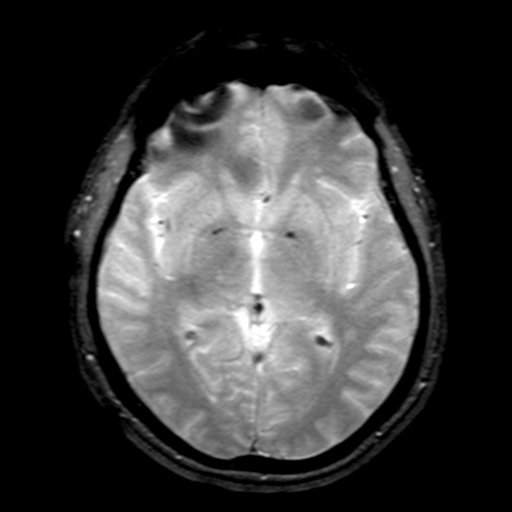
[im 28/28]
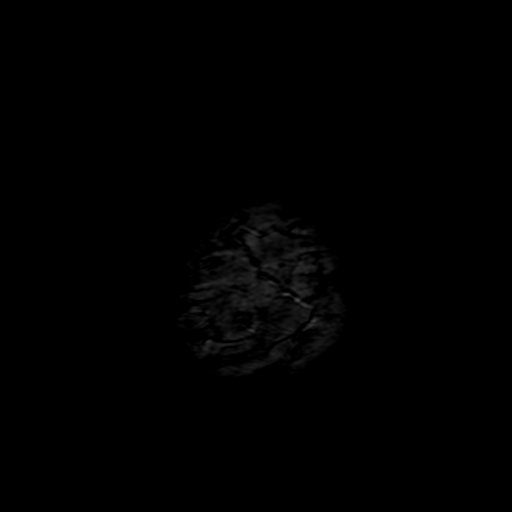

[Series 7: FLAIR · axial · 3.0mm · 0.43mm/px · z∈[-29,+124]mm · 4 of 42 slices shown]
[im 1/42]
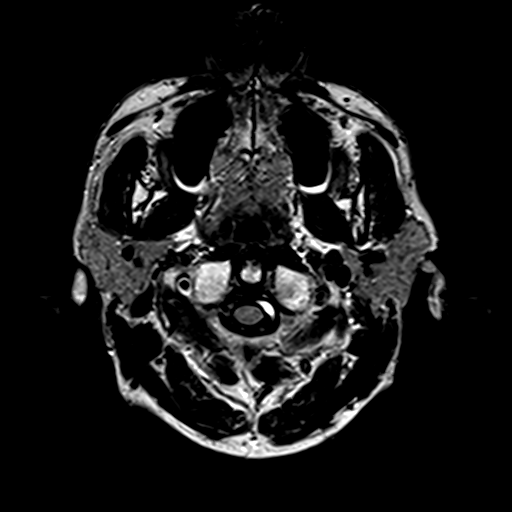
[im 14/42]
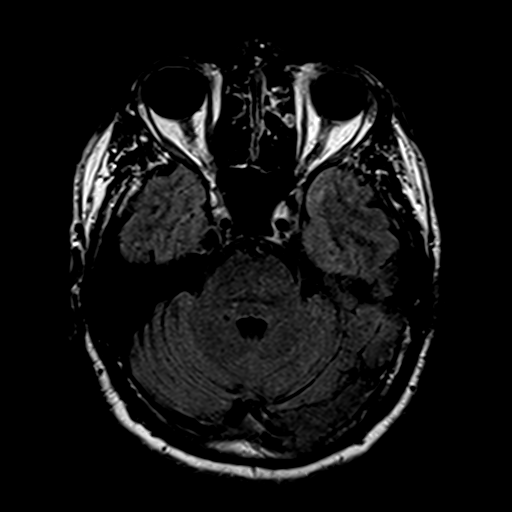
[im 28/42]
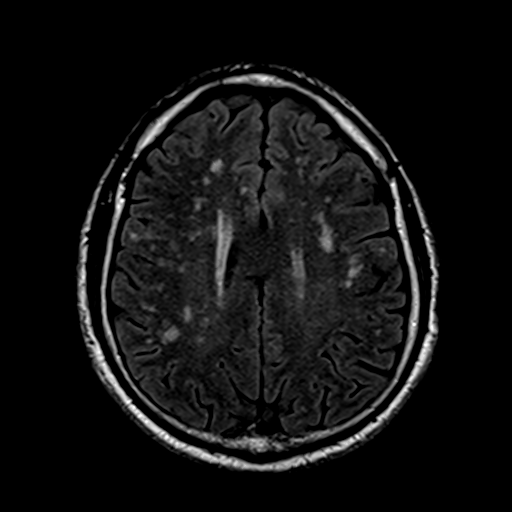
[im 42/42]
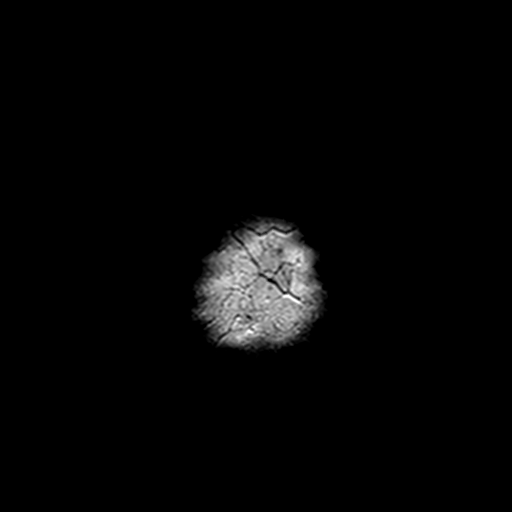

[Series 8: t1_3d_tra · axial · 2.0mm · 0.94mm/px · z∈[-34,+144]mm · 9 of 96 slices shown]
[im 1/96]
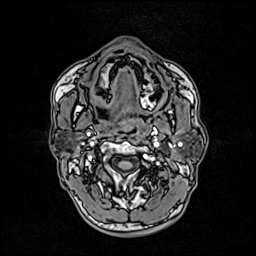
[im 12/96]
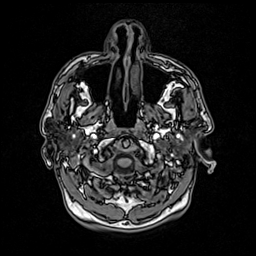
[im 24/96]
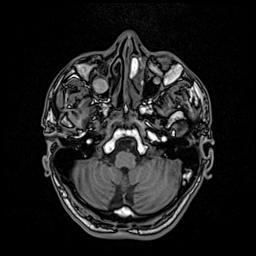
[im 36/96]
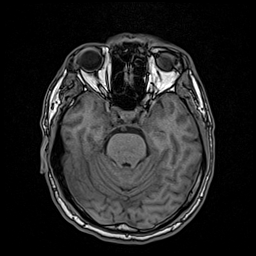
[im 48/96]
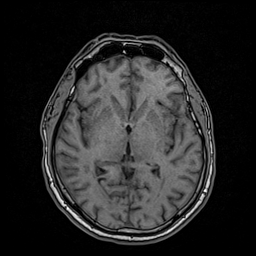
[im 60/96]
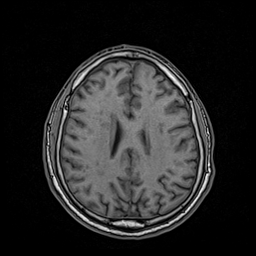
[im 72/96]
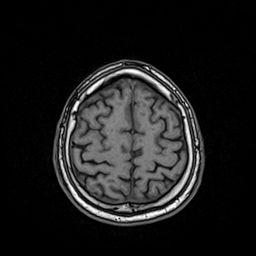
[im 84/96]
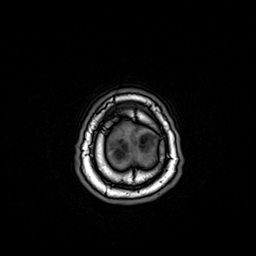
[im 96/96]
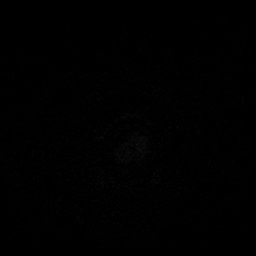

[Series 9: T2 · coronal · 5.0mm · 0.69mm/px · 3 of 30 slices shown (3 of 3)]
[im 1/30]
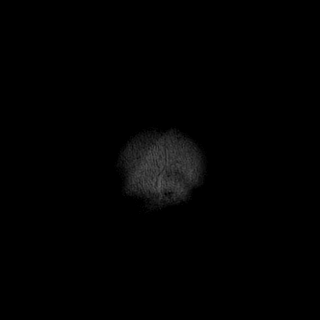
[im 15/30]
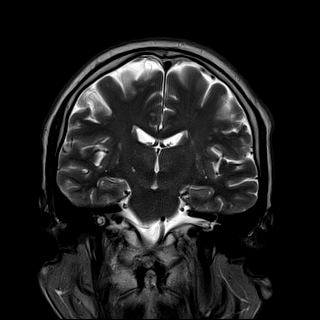
[im 30/30]
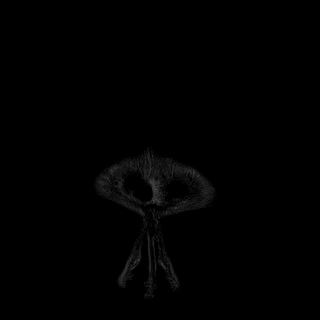

[Series 10: t1_3d_tra +c · axial · 2.0mm · 0.94mm/px · z∈[-34,+144]mm · 9 of 96 slices shown]
[im 1/96]
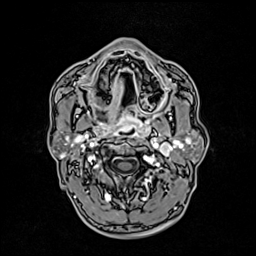
[im 12/96]
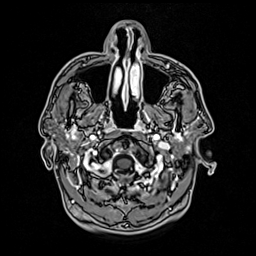
[im 24/96]
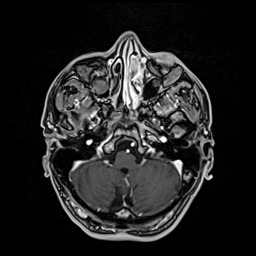
[im 36/96]
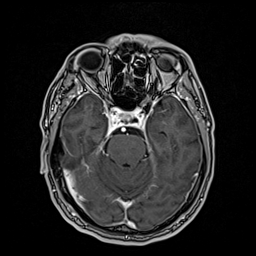
[im 48/96]
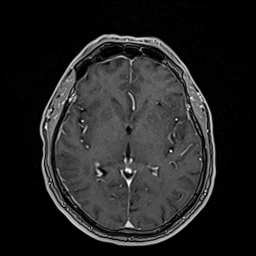
[im 60/96]
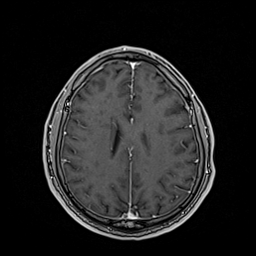
[im 72/96]
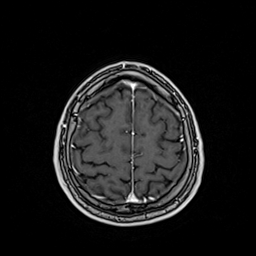
[im 84/96]
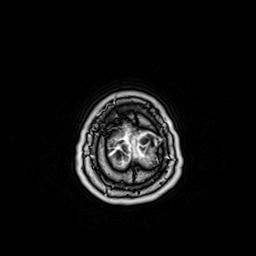
[im 96/96]
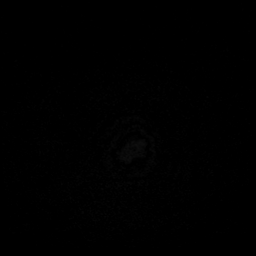

[Series 11: T1 · coronal · 5.0mm · 0.86mm/px · 3 of 30 slices shown (2 of 2)]
[im 1/30]
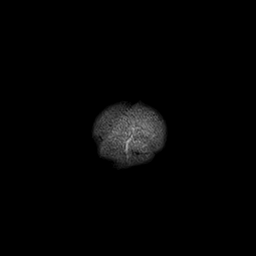
[im 15/30]
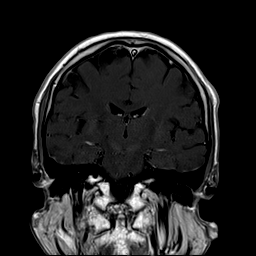
[im 30/30]
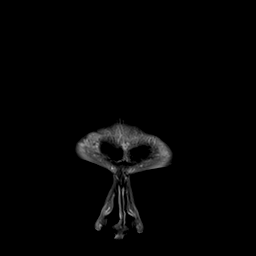

[48 of 48 positions shown; findings below may reference images not displayed]

FINDINGS: Brain: Cerebral volume is within normal limits for age. No
restricted diffusion to suggest acute infarction. No midline shift,
mass effect, evidence of mass lesion, ventriculomegaly, extra-axial
collection or acute intracranial hemorrhage. Cervicomedullary
junction and pituitary are within normal limits.

Widely scattered bilateral cerebral white matter T2 and FLAIR
hyperintensity in a nonspecific configuration. The extent is
moderate for age. Minimal T2 heterogeneity in the deep gray nuclei
and pons.

No cortical encephalomalacia. No chronic cerebral blood products
identified. Cerebellum and mesial temporal lobes appear normal.

No abnormal enhancement identified.  No dural thickening.

Vascular: Major intracranial vascular flow voids are preserved.
There is mild generalized intracranial artery tortuosity. The major
dural venous sinuses are enhancing and appear patent.

Skull and upper cervical spine: Normal visible cervical spine and
bone marrow signal.

Sinuses/Orbits: Orbits appear normal. There are scattered generally
small mucous retention cysts in both maxillary sinuses. Other
paranasal sinuses are well pneumatized, and hyperplastic overall.

Other: Similar hyperplastic mastoid air cells are clear. Visible
internal auditory structures appear normal. Scalp and face soft
tissues appear negative.
IMPRESSION: 1. No acute intracranial abnormality.
2. Moderately advanced but nonspecific cerebral white matter signal
changes, most commonly due to chronic small vessel disease.

## 2021-01-21 NOTE — Progress Notes (Addendum)
[facility] Note   PCP: Adrienne Mocha, PA PCP-Cardiologist:  Dr. Joaquim Nam  AHFC: Dr. Shirlee Latch  CT Surgeon: Dr. Cliffton Asters   Reason for Visit: F/u for Systolic Heart Failure 2/2 ICM s/p CABG   HPI:  70 y/o AAM w/ h/o anemia, ?prior seizure, former smoker (quit 70 y/o), HLD and newly diagnosed ischemic heart disease/ systolic heart failure, and s/p CABG x 4.   Admitted to [facility] 5/22 for acute CHF and ruled in for NSTEMi.  Diuresed w/ IV lasix and placed on IV heparin. Echo showed severely reduced LVEF <20% w/ global HK, GIIDD and small LV apical thrombus, moderate MR and RV mildly reduced. R/LHC showed severe MVCAD (angiographic details below) and well compensated hemodynamics w/ low/normal filling pressures and preserved CO. RAP 1 mmHg, PCW 6 mmHg, CO 4.9 L/min, CI 2.5 L/min2. cMRI suggested extensive viability (except for apical septal wall and true apex) + small LV thrombus.   Underwent CABG x 4 by Dr. Cliffton Asters (LIMA-LAD, SVG-D2, SVG-OM1, SVG-PDA). Post-operative course uncomplicated. GDMT initiated prior to d/c. Also of note, LDL was markedly elevated at 204 mg/dL. Was placed on high intensity statin &  Coumadin for LV thrombus.  He presented to clinic 6/22 for post hospital f/u w/ his daughter and niece. Stable NYHA II symptoms. REDs was elevated at 40%, Entresto started and instructed to take lasix for 1 day. His follow up clinic appointment he had stable Class II symptoms, weight was stable, no room with BP to titrate medications.  Today he returns for HF follow up with his niece. Overall feeling fine. He has now started CR, no SOB with exercising. Denies CP, dizziness, edema, or PND/Orthopnea. Appetite ok. No fever or chills. Weight at home 141 pounds. Taking all medications, has not need any lasix. 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 Working on limiting excessive sodium in diet. He accidentally poked himself in the left eye, now with broken  blood vessels but no vision changes.   Cardiac Studies   - Echo (5/22): EF <20%, LV severely decreased, Grade II DD, small LV apical thrombus, RV mildly reduced.  - cMRI (5/22): Severe LV enlargement with severe global hypokinesis with regional variation. EF 14%, small apical LV thrombus, normal RV size with mildly decreased systolic function, EF 36%, moderate MR, Based on severe LV dysfunction, there was less late gadolinium enhancement than I would have expected. With the exception of the apical septal wall and the true apex, would expect the remainder of the LV myocardium to be viable.  - LHC (5/22):  LM: Long vessel. Normal LAD: Prox 90% stenosis, followed by flush occlusion after Diag 1        No antegrade flow seen in LAD        Faint collaterals from RPL fill small caliber LAD        Diag 1 with prox tandem 95% stenoses LCx: Prox tandem 95% stenoses        Prox OM1 60% stenosis        Mid LCx 60% stenosis RCA: Prox 90% stenosis, followed by 100% occlusion         Bridging collaterals faintly fill mid RCA, and reconstitute distal RCA         Prox RPDA 70% stenosis, followed by 50 stenosis   PA: 39/14 mmHg, mean PAP 23 mmHg PCW: 6 mmHg  - S/p CABG x 4 LIMA-LAD, SVG-D2, SVG-OM1, SVG-PDA on 11/05/2020   Review  of systems complete and found to be negative unless listed in HPI.     Past Medical History:  Diagnosis Date   Anemia    Asthma    childhood   Blood in stool    Childhood asthma    GI bleed    Kidney laceration 1999   Seizures (HCC)    Current Outpatient Medications  Medication Sig Dispense Refill   aspirin EC 81 MG EC tablet Take 1 tablet (81 mg total) by mouth daily. Swallow whole. 30 tablet 11   atorvastatin (LIPITOR) 80 MG tablet Take 1 tablet (80 mg total) by mouth daily. 30 tablet 3   carvedilol (COREG) 3.125 MG tablet Take 1 tablet (3.125 mg total) by mouth 2 (two) times daily with a meal. 60 tablet 3   dapagliflozin propanediol (FARXIGA) 10 MG TABS tablet  Take 1 tablet (10 mg total) by mouth daily. 30 tablet 3   digoxin (LANOXIN) 0.125 MG tablet Take 1 tablet (0.125 mg total) by mouth daily. 30 tablet 3   ezetimibe (ZETIA) 10 MG tablet Take 1 tablet (10 mg total) by mouth daily. 90 tablet 3   furosemide (LASIX) 20 MG tablet Take 1 tablet (20 mg total) by mouth daily as needed for fluid or edema. 45 tablet 3   Multiple Vitamin (MULTIVITAMIN WITH MINERALS) TABS tablet Take 1 tablet by mouth daily.     sacubitril-valsartan (ENTRESTO) 24-26 MG Take 1 tablet by mouth 2 (two) times daily. 60 tablet 11   spironolactone (ALDACTONE) 25 MG tablet Take 1 tablet (25 mg total) by mouth daily. 30 tablet 3   warfarin (COUMADIN) 5 MG tablet Take 1 tablet (5 mg total) by mouth daily at 4 PM. (Patient taking differently: Take 5 mg by mouth daily at 4 PM. 5 mg, and 2.5 rotating as of right now.) 30 tablet 3   oxyCODONE (OXY IR/ROXICODONE) 5 MG immediate release tablet Take 1 tablet (5 mg total) by mouth every 4 (four) hours as needed for severe pain. (Patient not taking: Reported on 01/22/2021) 30 tablet 0   No current facility-administered medications for this encounter.   Allergies  Allergen Reactions   Penicillins Other (See Comments)    childhood   Social History   Socioeconomic History   Marital status: Widowed    Spouse name: Not on file   Number of children: Not on file   Years of education: Not on file   Highest education level: Not on file  Occupational History   Occupation: truck driver  Tobacco Use   Smoking status: Former   Smokeless tobacco: Never   Tobacco comments:    quit in 90's  Substance and Sexual Activity   Alcohol use: Yes    Comment: 1 beer/month   Drug use: No   Sexual activity: Not on file  Other Topics Concern   Not on file  Social History Narrative   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     Social Determinants of Health   Financial Resource Strain: Low Risk    Difficulty of Paying Living Expenses: Not very hard  Food  Insecurity: No Food Insecurity   Worried About Programme researcher, broadcasting/film/video in the Last Year: Never true   Ran Out of Food in the Last Year: Never true  Transportation Needs: No Transportation Needs   Lack of Transportation (Medical): No   Lack of Transportation (Non-Medical): No  Physical Activity: Not on file  Stress: Not on file  Social Connections: Not on file  Intimate Partner Violence: Not on file   Family History  Problem Relation Age of Onset   Cervical cancer Mother    Hypertension Father    Colon cancer Neg Hx    Pancreatic cancer Neg Hx    Esophageal cancer Neg Hx    Stomach cancer Neg Hx    BP (!) 99/56   Pulse 64   Ht 6\' 1"  (1.854 m)   Wt 67.6 kg (149 lb)   SpO2 98%   BMI 19.66 kg/m   Wt Readings from Last 3 Encounters:  01/22/21 67.6 kg (149 lb)  12/25/20 67.3 kg (148 lb 6.4 oz)  12/16/20 65.8 kg (145 lb)   PHYSICAL EXAM: General:  NAD. No resp difficulty, thin. HEENT: Normal, + left subconjunctival hemorrhage. Neck: Supple. No JVD. Carotids 2+ bilat; no bruits. No lymphadenopathy or thryomegaly appreciated. Cor: PMI nondisplaced. Regular rate & rhythm. No rubs, gallops or murmurs. Lungs: Clear Abdomen: Soft, nontender, nondistended. No hepatosplenomegaly. No bruits or masses. Good bowel sounds. Extremities: No cyanosis, clubbing, rash, edema Neuro: Alert & oriented x 3, cranial nerves grossly intact. Moves all 4 extremities w/o difficulty. Affect pleasant.  ASSESSMENT & PLAN: 1. CAD: NSTEMI (5/22). Cath w/ severe MVCAD. S/p LIMA-LAD, SVG-D2, SVG-OM1, SVG-PDA  - Stable w/o CP.  - ASA, statin, ? blocker  - Continue CR 2. Chronic Systolic Heart Failure: Ischemic CM w/ severe native vessel CAD. EF < 20%. RV mildly reduced. cMRI suggested extensive viability. Now s/p CABG. NYHA Class II. Volume status stable. Weight is stable. - Continue Lasix 20 mg prn. - Continue Entresto 24-26 mg bid. CMET today. - Continue Farxiga 10 mg daily.  - Continue spiro 25 mg daily.   - Continue Digoxin 0.125 mg daily. Check digoxin level today. - Continue Coreg 3.125 mg bid.   - Dicussed daily wts + low sodium diet and fluid restriction.  - Continue CR at [facility]. - Repeat echo. If EF not improved >35%, will refer to EP for ICD. QRS narrow. 3. HLD: LDL 204 mg/dL on Lipid panel ([facility]). LDL goal < 70 mg/dL. Has been on atorva 80 mg since 5/22 - LDL down to 155 mg/dL. HFTs normal (6/22). - Continue Zeitia  10 mg daily. - Repeat FLP + HFTs today, if not at goal, will place referral to Lipid Clinic for injectable [id number] vs Leqvio. 4. LV Thrombus: small thrombus noted on cMRI. - On Coumadin. Followed by [facility].  - Denies abnormal bleeding.   - HFSW is assisting with financial issues and medical bills.  F/u echo + visit w/ Dr. [facility] in 2 months.    Shirlee Latch [place], FNP 01/22/21

## 2021-01-22 ENCOUNTER — Other Ambulatory Visit: Payer: Self-pay

## 2021-01-22 ENCOUNTER — Encounter (HOSPITAL_COMMUNITY): Payer: Self-pay

## 2021-01-22 ENCOUNTER — Ambulatory Visit (HOSPITAL_COMMUNITY)
Admission: RE | Admit: 2021-01-22 | Discharge: 2021-01-22 | Disposition: A | Discharge status: Home / Self Care | Payer: Federal, State, Local not specified - PPO | Source: Ambulatory Visit | Attending: Family Medicine | Admitting: Family Medicine

## 2021-01-22 VITALS — BP 99/56 | HR 64 | Ht 73.0 in | Wt 149.0 lb

## 2021-01-22 DIAGNOSIS — E785 Hyperlipidemia, unspecified: Secondary | ICD-10-CM | POA: Insufficient documentation

## 2021-01-22 DIAGNOSIS — Z8249 Family history of ischemic heart disease and other diseases of the circulatory system: Secondary | ICD-10-CM | POA: Diagnosis not present

## 2021-01-22 DIAGNOSIS — Z880 Allergy status to penicillin: Secondary | ICD-10-CM | POA: Diagnosis not present

## 2021-01-22 DIAGNOSIS — Z79899 Other long term (current) drug therapy: Secondary | ICD-10-CM | POA: Insufficient documentation

## 2021-01-22 DIAGNOSIS — Z7901 Long term (current) use of anticoagulants: Secondary | ICD-10-CM | POA: Insufficient documentation

## 2021-01-22 DIAGNOSIS — I5022 Chronic systolic (congestive) heart failure: Secondary | ICD-10-CM | POA: Diagnosis not present

## 2021-01-22 DIAGNOSIS — Z951 Presence of aortocoronary bypass graft: Secondary | ICD-10-CM | POA: Insufficient documentation

## 2021-01-22 DIAGNOSIS — I513 Intracardiac thrombosis, not elsewhere classified: Secondary | ICD-10-CM

## 2021-01-22 DIAGNOSIS — I255 Ischemic cardiomyopathy: Secondary | ICD-10-CM | POA: Insufficient documentation

## 2021-01-22 DIAGNOSIS — E782 Mixed hyperlipidemia: Secondary | ICD-10-CM

## 2021-01-22 DIAGNOSIS — Z7982 Long term (current) use of aspirin: Secondary | ICD-10-CM | POA: Insufficient documentation

## 2021-01-22 DIAGNOSIS — I214 Non-ST elevation (NSTEMI) myocardial infarction: Secondary | ICD-10-CM | POA: Insufficient documentation

## 2021-01-22 DIAGNOSIS — I2510 Atherosclerotic heart disease of native coronary artery without angina pectoris: Secondary | ICD-10-CM | POA: Insufficient documentation

## 2021-01-22 DIAGNOSIS — I5020 Unspecified systolic (congestive) heart failure: Secondary | ICD-10-CM | POA: Diagnosis not present

## 2021-01-22 DIAGNOSIS — Z87891 Personal history of nicotine dependence: Secondary | ICD-10-CM | POA: Diagnosis not present

## 2021-01-22 DIAGNOSIS — Z7984 Long term (current) use of oral hypoglycemic drugs: Secondary | ICD-10-CM | POA: Diagnosis not present

## 2021-01-22 LAB — COMPREHENSIVE METABOLIC PANEL
ALT: 31 U/L (ref 0–44)
AST: 27 U/L (ref 15–41)
Albumin: 3.6 g/dL (ref 3.5–5.0)
Alkaline Phosphatase: 62 U/L (ref 38–126)
Anion gap: 4 — ABNORMAL LOW (ref 5–15)
BUN: 20 mg/dL (ref 8–23)
CO2: 30 mmol/L (ref 22–32)
Calcium: 9.6 mg/dL (ref 8.9–10.3)
Chloride: 105 mmol/L (ref 98–111)
Creatinine, Ser: 1.23 mg/dL (ref 0.61–1.24)
GFR, Estimated: >60 mL/min (ref >60)
Glucose, Bld: 99 mg/dL (ref 70–99)
Potassium: 4.3 mmol/L (ref 3.5–5.1)
Sodium: 139 mmol/L (ref 135–145)
Total Bilirubin: 0.4 mg/dL (ref 0.3–1.2)
Total Protein: 6.4 g/dL — ABNORMAL LOW (ref 6.5–8.1)

## 2021-01-22 LAB — DIGOXIN LEVEL: Digoxin Level: 0.7 ng/mL — ABNORMAL LOW (ref 0.8–2.0)

## 2021-01-22 LAB — LIPID PANEL
Cholesterol: 109 mg/dL (ref 0–200)
HDL: 48 mg/dL (ref >40)
LDL Cholesterol: 49 mg/dL (ref 0–99)
Total CHOL/HDL Ratio: 2.3 RATIO
Triglycerides: 60 mg/dL (ref <150)
VLDL: 12 mg/dL (ref 0–40)

## 2021-01-22 NOTE — Progress Notes (Signed)
CSW referred to assist patient with some resources regarding medical bills. CSW met with patient and his niece in the clinic. Patient is a 70yo male who worked for the [employer] his entire career. He notes that he has a SSA income and [employer]. CSW explored unpaid medical bills and it appears patient has minimal medical expenses. Patient reports he has multiple letters at home from [facility] but has not really read through them to see his actual expenses. CSW encouraged patient to explore the bills at home and contact financial counseling to set up a payment plan if he is unable to afford the expenses and to avoid it going to collections. Patient does not appear to have enough unpaid medical bills to apply for any type of assistance and would most likely be over income. Patient and niece verbalize understanding of follow up and will call if needed. Raquel Sarna, [place], [place]

## 2021-01-22 NOTE — Patient Instructions (Signed)
It was great to see you today! No medication changes are needed at this time.  Labs today We will only contact you if something comes back abnormal or we need to make some changes. Otherwise no news is good news!  Your physician recommends that you schedule a follow-up appointment in: 2-3 months with Dr Shirlee Latch and echo  Your physician has requested that you have an echocardiogram. Echocardiography is a painless test that uses sound waves to create images of your heart. It provides your doctor with information about the size and shape of your heart and how well your heart's chambers and valves are working. This procedure takes approximately one hour. There are no restrictions for this procedure.  Do the following things EVERYDAY: Weigh yourself in the morning before breakfast. Write it down and keep it in a log. Take your medicines as prescribed Eat low salt foods--Limit salt (sodium) to 2000 mg per day.  Stay as active as you can everyday Limit all fluids for the day to less than 2 liters  milAt the [facility], you and your health needs are our priority. As part of our continuing mission to provide you with exceptional heart care, we have created designated Provider Care Teams. These Care Teams include your primary Cardiologist (physician) and Advanced Practice Providers (APPs- Physician Assistants and Nurse Practitioners) who all work together to provide you with the care you need, when you need it.   You may see any of the following providers on your designated Care Team at your next follow up: Dr Arvilla Meres Dr Marca Ancona Dr Brandon Melnick, NP Robbie Lis, [place] Mikki Santee Karle Plumber, PharmD   Please be sure to bring in all your medications bottles to every appointment.   If you have any questions or concerns before your next appointment please send [place] a message through [place] or call our office at [phone].    TO LEAVE A  MESSAGE FOR THE NURSE SELECT OPTION 2, PLEASE LEAVE A MESSAGE INCLUDING: YOUR NAME DATE OF BIRTH CALL BACK NUMBER REASON FOR CALL**this is important as we prioritize the call backs  YOU WILL RECEIVE A CALL BACK THE SAME DAY AS LONG AS YOU CALL BEFORE 4:00 PM

## 2021-01-28 ENCOUNTER — Ambulatory Visit: Payer: Federal, State, Local not specified - PPO | Admitting: *Deleted

## 2021-01-28 ENCOUNTER — Telehealth (HOSPITAL_COMMUNITY): Payer: Self-pay | Admitting: Family Medicine

## 2021-01-28 ENCOUNTER — Encounter (HOSPITAL_COMMUNITY): Payer: Self-pay | Admitting: Cardiology

## 2021-01-28 ENCOUNTER — Other Ambulatory Visit: Payer: Self-pay

## 2021-01-28 DIAGNOSIS — I513 Intracardiac thrombosis, not elsewhere classified: Secondary | ICD-10-CM | POA: Diagnosis not present

## 2021-01-28 DIAGNOSIS — Z5181 Encounter for therapeutic drug level monitoring: Secondary | ICD-10-CM | POA: Diagnosis not present

## 2021-01-28 LAB — PROTIME-INR
INR: >10 (ref 0.9–1.2)
Prothrombin Time: 106.9 s — ABNORMAL HIGH (ref 9.1–12.0)

## 2021-01-28 LAB — POCT INR: INR: 8 — AB (ref 2.0–3.0)

## 2021-01-28 NOTE — Telephone Encounter (Signed)
Discussed with Dr. Shirlee Latch, OK for patient to return to work.  Prince Rome, FNP-BC

## 2021-01-28 NOTE — Patient Instructions (Addendum)
Description   INR resulted >10 per [facility] 01/28/21 at 112pm-spoke with niece, Trellis Moment, per pt and advised pt to hold warfarin today, hold warfarin Wednesday, hold warfarin Thursday, hold warfarin Friday and eat leafy veggies daily until appt. Recheck INR on Friday at 1115am.  Do not take any Warfarin until STAT INR results result & we call you, report to ER with any bleeding, falls, accidents. Per pt call niece Trellis Moment with results  [phone]  Normal dose: Warfarin 1 tablet daily except for 1/2 tablet on Tuesdays, Thursdays and Saturdays. Coumadin Clinic [phone]. Call if any signs of abnormal bleeding.

## 2021-01-31 ENCOUNTER — Ambulatory Visit: Payer: Federal, State, Local not specified - PPO

## 2021-01-31 ENCOUNTER — Other Ambulatory Visit: Payer: Self-pay

## 2021-01-31 DIAGNOSIS — I513 Intracardiac thrombosis, not elsewhere classified: Secondary | ICD-10-CM

## 2021-01-31 DIAGNOSIS — Z5181 Encounter for therapeutic drug level monitoring: Secondary | ICD-10-CM | POA: Diagnosis not present

## 2021-01-31 LAB — POCT INR: INR: 4.8 — AB (ref 2.0–3.0)

## 2021-01-31 NOTE — Patient Instructions (Addendum)
Description   Skip today's dosage of Warfarin, then resume same dosage of Warfarin 1 tablet daily except for 1/2 tablet on Tuesdays, Thursdays and Saturdays. Recheck in 10 days.  Coumadin Clinic [phone]. Call if any signs of abnormal bleeding.

## 2021-02-10 ENCOUNTER — Other Ambulatory Visit: Payer: Self-pay

## 2021-02-10 ENCOUNTER — Ambulatory Visit: Payer: Federal, State, Local not specified - PPO | Admitting: *Deleted

## 2021-02-10 DIAGNOSIS — Z5181 Encounter for therapeutic drug level monitoring: Secondary | ICD-10-CM | POA: Diagnosis not present

## 2021-02-10 LAB — POCT INR: INR: 1.1 — AB (ref 2.0–3.0)

## 2021-02-10 NOTE — Patient Instructions (Signed)
Description   Take warfarin 1.5 tablets today and 1 tablet tomorrow, then continue to take warfarin 1 tablet daily except for 1/2 a tablet on Tuesday, Thursday and Saturday.  Recheck INR in 1 week. Coumadin Clinic [phone]

## 2021-02-17 ENCOUNTER — Ambulatory Visit: Payer: Federal, State, Local not specified - PPO | Admitting: *Deleted

## 2021-02-17 ENCOUNTER — Other Ambulatory Visit: Payer: Self-pay

## 2021-02-17 DIAGNOSIS — I513 Intracardiac thrombosis, not elsewhere classified: Secondary | ICD-10-CM

## 2021-02-17 DIAGNOSIS — Z5181 Encounter for therapeutic drug level monitoring: Secondary | ICD-10-CM | POA: Diagnosis not present

## 2021-02-17 LAB — POCT INR: INR: 1.9 — AB (ref 2.0–3.0)

## 2021-02-17 NOTE — Patient Instructions (Signed)
Description   Take warfarin 1.5 tablets today then start taking warfarin 1 tablet daily except for 1/2 tablet on Tuesdays and Saturdays. Continue leafy veggies three days a week only. Recheck INR in 10 days. Coumadin Clinic [phone]

## 2021-02-27 ENCOUNTER — Ambulatory Visit: Payer: Federal, State, Local not specified - PPO

## 2021-02-27 ENCOUNTER — Other Ambulatory Visit: Payer: Self-pay

## 2021-02-27 DIAGNOSIS — Z5181 Encounter for therapeutic drug level monitoring: Secondary | ICD-10-CM

## 2021-02-27 DIAGNOSIS — I513 Intracardiac thrombosis, not elsewhere classified: Secondary | ICD-10-CM

## 2021-02-27 LAB — POCT INR: INR: 2.9 (ref 2.0–3.0)

## 2021-02-27 NOTE — Patient Instructions (Signed)
Description   Continue taking warfarin 1 tablet daily except for 1/2 tablet on Tuesdays and Saturdays. Continue leafy veggies three days a week only. Recheck INR in 3 weeks. Coumadin Clinic [phone]

## 2021-03-10 ENCOUNTER — Other Ambulatory Visit: Payer: Self-pay | Admitting: Physician Assistant

## 2021-03-20 ENCOUNTER — Ambulatory Visit: Payer: Federal, State, Local not specified - PPO

## 2021-03-20 ENCOUNTER — Other Ambulatory Visit: Payer: Self-pay

## 2021-03-20 DIAGNOSIS — Z5181 Encounter for therapeutic drug level monitoring: Secondary | ICD-10-CM | POA: Diagnosis not present

## 2021-03-20 LAB — POCT INR: INR: 2.5 (ref 2.0–3.0)

## 2021-03-20 NOTE — Patient Instructions (Signed)
Description   Continue taking warfarin 1 tablet daily except for 1/2 tablet on Tuesdays and Saturdays. Continue leafy veggies three days a week only. Recheck INR in 4 weeks. Coumadin Clinic [phone]

## 2021-03-30 ENCOUNTER — Other Ambulatory Visit: Payer: Self-pay | Admitting: Physician Assistant

## 2021-03-31 ENCOUNTER — Other Ambulatory Visit (HOSPITAL_COMMUNITY): Payer: Self-pay | Admitting: *Deleted

## 2021-03-31 ENCOUNTER — Other Ambulatory Visit (HOSPITAL_COMMUNITY): Payer: Self-pay | Admitting: Cardiology

## 2021-03-31 DIAGNOSIS — I5020 Unspecified systolic (congestive) heart failure: Secondary | ICD-10-CM

## 2021-03-31 NOTE — Telephone Encounter (Signed)
I don't see where they have seen a provider. Routing to the pharmd pool

## 2021-04-01 ENCOUNTER — Ambulatory Visit (HOSPITAL_COMMUNITY)
Admission: RE | Admit: 2021-04-01 | Discharge: 2021-04-01 | Disposition: A | Discharge status: Home / Self Care | Payer: Federal, State, Local not specified - PPO | Source: Ambulatory Visit | Attending: Cardiology | Admitting: Cardiology

## 2021-04-01 ENCOUNTER — Other Ambulatory Visit (HOSPITAL_COMMUNITY): Payer: Self-pay | Admitting: Cardiology

## 2021-04-01 ENCOUNTER — Other Ambulatory Visit: Payer: Self-pay

## 2021-04-01 ENCOUNTER — Ambulatory Visit (HOSPITAL_BASED_OUTPATIENT_CLINIC_OR_DEPARTMENT_OTHER)
Admission: RE | Admit: 2021-04-01 | Discharge: 2021-04-01 | Disposition: A | Discharge status: Home / Self Care | Payer: Federal, State, Local not specified - PPO | Source: Ambulatory Visit | Attending: Cardiology | Admitting: Cardiology

## 2021-04-01 ENCOUNTER — Encounter (HOSPITAL_COMMUNITY): Payer: Self-pay | Admitting: Cardiology

## 2021-04-01 VITALS — BP 118/70 | HR 59 | Wt 154.6 lb

## 2021-04-01 DIAGNOSIS — I5022 Chronic systolic (congestive) heart failure: Secondary | ICD-10-CM

## 2021-04-01 DIAGNOSIS — I5020 Unspecified systolic (congestive) heart failure: Secondary | ICD-10-CM

## 2021-04-01 DIAGNOSIS — Z951 Presence of aortocoronary bypass graft: Secondary | ICD-10-CM | POA: Insufficient documentation

## 2021-04-01 DIAGNOSIS — I493 Ventricular premature depolarization: Secondary | ICD-10-CM

## 2021-04-01 DIAGNOSIS — R9431 Abnormal electrocardiogram [ECG] [EKG]: Secondary | ICD-10-CM | POA: Insufficient documentation

## 2021-04-01 DIAGNOSIS — I2510 Atherosclerotic heart disease of native coronary artery without angina pectoris: Secondary | ICD-10-CM | POA: Diagnosis not present

## 2021-04-01 LAB — BASIC METABOLIC PANEL
Anion gap: 5 (ref 5–15)
BUN: 21 mg/dL (ref 8–23)
CO2: 29 mmol/L (ref 22–32)
Calcium: 9.8 mg/dL (ref 8.9–10.3)
Chloride: 105 mmol/L (ref 98–111)
Creatinine, Ser: 1.09 mg/dL (ref 0.61–1.24)
GFR, Estimated: >60 mL/min (ref >60)
Glucose, Bld: 94 mg/dL (ref 70–99)
Potassium: 4.4 mmol/L (ref 3.5–5.1)
Sodium: 139 mmol/L (ref 135–145)

## 2021-04-01 LAB — DIGOXIN LEVEL: Digoxin Level: 0.8 ng/mL (ref 0.8–2.0)

## 2021-04-01 LAB — ECHOCARDIOGRAM COMPLETE
S' Lateral: 5.3 cm
Single Plane A4C EF: 34.8 %

## 2021-04-01 MED ORDER — CARVEDILOL 6.25 MG PO TABS: 6.2500 mg | ORAL_TABLET | Freq: Two times a day (BID) | ORAL | 3 refills | Status: DC

## 2021-04-01 NOTE — Progress Notes (Signed)
Advanced Heart Failure Clinic Note   PCP: Adrienne Mocha, PA Cardiology: Dr. Shirlee Latch  CT Surgeon: Dr. Cliffton Asters   Reason for Visit: F/u for Systolic Heart Failure 2/2 ICM s/p CABG   HPI:  70 y.o. AAM w/ h/o anemia, ?prior seizure, former smoker (quit 70 y/o), HLD and newly diagnosed ischemic heart disease/ systolic heart failure, and s/p CABG x 4.   Admitted to [facility] 5/22 for acute CHF and ruled in for NSTEMi.  Diuresed w/ IV lasix and placed on IV heparin. Echo showed severely reduced LVEF <20% w/ global HK, GIIDD and small LV apical thrombus, moderate MR and RV mildly reduced. R/LHC showed severe MVCAD (angiographic details below) and well compensated hemodynamics w/ low/normal filling pressures and preserved CO. RAP 1 mmHg, PCW 6 mmHg, CO 4.9 L/min, CI 2.5 L/min2. cMRI suggested extensive viability (except for apical septal wall and true apex) + small LV thrombus.   Underwent CABG x 4 by Dr. Cliffton Asters (LIMA-LAD, SVG-D2, SVG-OM1, SVG-PDA). Post-operative course uncomplicated. GDMT initiated prior to d/c. Also of note, LDL was markedly elevated at 204 mg/dL. Was placed on high intensity statin &  Coumadin for LV thrombus.  Echo was done today and reviewed, EF 30-35%, mildly decreased RV systolic function.   He returns today for followup of CHF and CAD.  Symptomatically doing well.  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  He has not had to use Lasix. No significant exertional dyspnea or chest pain. No orthopnea/PND. He is doing cardiac rehab at [facility].   Labs (7/22): K 4.3, creatinine 1.23, LDL 49, TGs 60, digoxin 0.7  ECG (personally reviewed): NSR, PVCs, old anterior MI.   PMH: 1. LV thrombus.  2. CAD: NSTEMI in 5/22 with severe 3 vessel disease on cath.  - CABG (5/22) with LIMA-LAD, SVG-D2, SVG-OM1, SVG-PDA.   3. Hyperlipidemia.  4. Chronic systolic CHF: Ischemic cardiomyopathy.  - Echo (5/22): EF <20%, LV severely decreased, Grade II DD, small LV apical thrombus, RV  mildly reduced. - cMRI (5/22): Severe LV enlargement with severe global hypokinesis with regional variation. EF 14%, small apical LV thrombus, normal RV size with mildly decreased systolic function, EF 36%, moderate MR, Based on severe LV dysfunction, there was less late gadolinium enhancement than I would have expected. With the exception of the apical septal wall and the true apex, would expect the remainder of the LV myocardium to be viable. - Echo (10/22): EF 30-35%, global hypokinesis, mildly decreased RV systolic function, IVC normal, no LV thrombus.   Review of systems complete and found to be negative unless listed in HPI.      Current Outpatient Medications  Medication Sig Dispense Refill   aspirin EC 81 MG EC tablet Take 1 tablet (81 mg total) by mouth daily. Swallow whole. 30 tablet 11   atorvastatin (LIPITOR) 80 MG tablet Take 1 tablet (80 mg total) by mouth daily. 30 tablet 3   dapagliflozin propanediol (FARXIGA) 10 MG TABS tablet Take 1 tablet (10 mg total) by mouth daily. 30 tablet 3   digoxin (LANOXIN) 0.125 MG tablet Take 1 tablet (0.125 mg total) by mouth daily. 30 tablet 3   ezetimibe (ZETIA) 10 MG tablet Take 1 tablet (10 mg total) by mouth daily. 90 tablet 3   furosemide (LASIX) 20 MG tablet Take 1 tablet (20 mg total) by mouth daily as needed for fluid or edema. 45 tablet 3   Multiple Vitamin (MULTIVITAMIN WITH MINERALS) TABS tablet Take 1 tablet by mouth  daily.     sacubitril-valsartan (ENTRESTO) 24-26 MG Take 1 tablet by mouth 2 (two) times daily. 60 tablet 11   spironolactone (ALDACTONE) 25 MG tablet Take 1 tablet (25 mg total) by mouth daily. 30 tablet 3   warfarin (COUMADIN) 5 MG tablet Take 1 tablet (5 mg total) by mouth daily at 4 PM. (Patient taking differently: Take 5 mg by mouth daily at 4 PM. 5 mg, and 2.5 rotating as of right now.) 30 tablet 3   carvedilol (COREG) 6.25 MG tablet Take 1 tablet (6.25 mg total) by mouth 2 (two) times daily with a meal. 180 tablet 3    No current facility-administered medications for this encounter.   Allergies  Allergen Reactions   Penicillins Other (See Comments)    childhood   Social History   Socioeconomic History   Marital status: Widowed    Spouse name: Not on file   Number of children: Not on file   Years of education: Not on file   Highest education level: Not on file  Occupational History   Occupation: truck driver  Tobacco Use   Smoking status: Former   Smokeless tobacco: Never   Tobacco comments:    quit in 90's  Substance and Sexual Activity   Alcohol use: Yes    Comment: 1 beer/month   Drug use: No   Sexual activity: Not on file  Other Topics Concern   Not on file  Social History Narrative   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     Social Determinants of Health   Financial Resource Strain: Low Risk    Difficulty of Paying Living Expenses: Not very hard  Food Insecurity: No Food Insecurity   Worried About Programme researcher, broadcasting/film/video in the Last Year: Never true   Ran Out of Food in the Last Year: Never true  Transportation Needs: No Transportation Needs   Lack of Transportation (Medical): No   Lack of Transportation (Non-Medical): No  Physical Activity: Not on file  Stress: Not on file  Social Connections: Not on file  Intimate Partner Violence: Not on file   Family History  Problem Relation Age of Onset   Cervical cancer Mother    Hypertension Father    Colon cancer Neg Hx    Pancreatic cancer Neg Hx    Esophageal cancer Neg Hx    Stomach cancer Neg Hx    BP 118/70   Pulse (!) 59   Wt 70.1 kg (154 lb 9.6 oz)   SpO2 97%   BMI 20.40 kg/m   Wt Readings from Last 3 Encounters:  04/01/21 70.1 kg (154 lb 9.6 oz)  01/22/21 67.6 kg (149 lb)  12/25/20 67.3 kg (148 lb 6.4 oz)   PHYSICAL EXAM: General: NAD Neck: No JVD, no thyromegaly or thyroid nodule.  Lungs: Clear to auscultation bilaterally with normal respiratory effort. CV: Nondisplaced PMI.  Heart regular S1/S2, no S3/S4,  no murmur.  No peripheral edema.  No carotid bruit.  Normal pedal pulses.  Abdomen: Soft, nontender, no hepatosplenomegaly, no distention.  Skin: Intact without lesions or rashes.  Neurologic: Alert and oriented x 3.  Psych: Normal affect. Extremities: No clubbing or cyanosis.  HEENT: Normal.   ASSESSMENT & PLAN: 1. CAD: NSTEMI (5/22). Cath w/ severe MVCAD. S/p LIMA-LAD, SVG-D2, SVG-OM1, SVG-PDA.  No chest pain.  - Continue ASA 81.  - Continue atorvastatin 80 mg daily.  2. Chronic Systolic Heart Failure: Ischemic CMP. EF < 20% on echo in 5/22. cMRI suggested  extensive viability. Now s/p CABG. Echo today showed EF 30-35%.  NYHA Class II. Volume status stable.  - Continue Lasix 20 mg prn, has not had to use. - Continue Entresto 24-26 mg bid. BMET today. - Continue Farxiga 10 mg daily.  - Continue spiro 25 mg daily.  - Continue Digoxin 0.125 mg daily. Check digoxin level today. - Increase Coreg to 6.25 mg bid.  - With persistent low EF, I will refer to EP for ICD. QRS narrow, not CRT candidate.  3. HLD: LDL 204 mg/dL on Lipid panel (5/72). LDL goal < 70 mg/dL. Good lipids in 7/22.  - Continue atorvastatin and Zetia.  4. LV Thrombus: small thrombus noted on cMRI. - On Coumadin. Followed by [facility].  5. Frequent PVCs: I will arrange for 7 day Zio monitor to assess.   Followup with HF pharmacist in 3 wks for medication titration.  Followup with me 2 months.  Refer to EP for ICD.    Marca Ancona, MD 04/01/21

## 2021-04-01 NOTE — Patient Instructions (Signed)
EKG done today.  Labs done today. We will contact you only if your labs are abnormal.  INCREASE Carvedilol to 6.25mg  (1  tablet) by mouth 2 times daily.   No other medication changes were made. Please continue all current medications as prescribed.  Your provider has recommended that  you wear a Zio Patch for 7 days.  This monitor will record your heart rhythm for our review.  IF you have any symptoms while wearing the monitor please press the button.  If you have any issues with the patch or you notice a red or orange light on it please call the company at [phone].  Once you remove the patch please mail it back to the company as soon as possible so we can get the results.  Your physician recommends that you schedule a follow-up appointment in: 3 weeks with our Clinic Pharmacist and in 2-3 months with Dr. Shirlee Latch. Please contact our office in December to schedule a January appointment.   If you have any questions or concerns before your next appointment please send [place] a message through [place] or call our office at [phone].    TO LEAVE A MESSAGE FOR THE NURSE SELECT OPTION 2, PLEASE LEAVE A MESSAGE INCLUDING: YOUR NAME DATE OF BIRTH CALL BACK NUMBER REASON FOR CALL**this is important as we prioritize the call backs  YOU WILL RECEIVE A CALL BACK THE SAME DAY AS LONG AS YOU CALL BEFORE 4:00 PM   Do the following things EVERYDAY: Weigh yourself in the morning before breakfast. Write it down and keep it in a log. Take your medicines as prescribed Eat low salt foods--Limit salt (sodium) to 2000 mg per day.  Stay as active as you can everyday Limit all fluids for the day to less than 2 liters   At the Advanced Heart Failure Clinic, you and your health needs are our priority. As part of our continuing mission to provide you with exceptional heart care, we have created designated Provider Care Teams. These Care Teams include your primary Cardiologist (physician) and Advanced  Practice Providers (APPs- Physician Assistants and Nurse Practitioners) who all work together to provide you with the care you need, when you need it.   You may see any of the following providers on your designated Care Team at your next follow up: Dr Arvilla Meres Dr Carron Curie, NP Robbie Lis, [place] Karle Plumber, PharmD   Please be sure to bring in all your medications bottles to every appointment.

## 2021-04-01 NOTE — Progress Notes (Signed)
  Echocardiogram 2D Echocardiogram has been performed.  Troy Johnson 04/01/2021, 10:29 AM

## 2021-04-02 NOTE — Telephone Encounter (Signed)
Pt followed in CHF clinic and had appt yesterday, ok to refill

## 2021-04-05 ENCOUNTER — Other Ambulatory Visit: Payer: Self-pay | Admitting: Physician Assistant

## 2021-04-10 ENCOUNTER — Other Ambulatory Visit: Payer: Self-pay | Admitting: Physician Assistant

## 2021-04-11 ENCOUNTER — Other Ambulatory Visit: Payer: Self-pay | Admitting: Physician Assistant

## 2021-04-11 ENCOUNTER — Other Ambulatory Visit (HOSPITAL_COMMUNITY): Payer: Self-pay | Admitting: Cardiology

## 2021-04-16 ENCOUNTER — Other Ambulatory Visit (HOSPITAL_COMMUNITY): Payer: Self-pay | Admitting: *Deleted

## 2021-04-16 ENCOUNTER — Other Ambulatory Visit (HOSPITAL_COMMUNITY): Payer: Self-pay

## 2021-04-16 ENCOUNTER — Other Ambulatory Visit (HOSPITAL_COMMUNITY): Payer: Self-pay | Admitting: Cardiology

## 2021-04-17 ENCOUNTER — Ambulatory Visit: Payer: Federal, State, Local not specified - PPO

## 2021-04-17 ENCOUNTER — Other Ambulatory Visit: Payer: Self-pay

## 2021-04-17 DIAGNOSIS — Z5181 Encounter for therapeutic drug level monitoring: Secondary | ICD-10-CM

## 2021-04-17 LAB — POCT INR: INR: 3.1 — AB (ref 2.0–3.0)

## 2021-04-17 NOTE — Patient Instructions (Signed)
Description   Continue taking warfarin 1 tablet daily except for 1/2 tablet on Tuesdays and Saturdays. Continue leafy veggies three days a week only. Recheck INR in 5 weeks. Coumadin Clinic [phone]

## 2021-04-23 ENCOUNTER — Other Ambulatory Visit (HOSPITAL_COMMUNITY): Payer: Self-pay

## 2021-04-24 ENCOUNTER — Other Ambulatory Visit: Payer: Self-pay

## 2021-04-24 ENCOUNTER — Other Ambulatory Visit (HOSPITAL_COMMUNITY): Payer: Self-pay | Admitting: *Deleted

## 2021-04-24 ENCOUNTER — Telehealth: Payer: Self-pay | Admitting: *Deleted

## 2021-04-24 ENCOUNTER — Ambulatory Visit (HOSPITAL_COMMUNITY)
Admission: RE | Admit: 2021-04-24 | Discharge: 2021-04-24 | Disposition: A | Discharge status: Home / Self Care | Payer: Federal, State, Local not specified - PPO | Source: Ambulatory Visit | Attending: Cardiology | Admitting: Cardiology

## 2021-04-24 VITALS — BP 102/62 | HR 76 | Wt 154.6 lb

## 2021-04-24 DIAGNOSIS — I5022 Chronic systolic (congestive) heart failure: Secondary | ICD-10-CM | POA: Diagnosis not present

## 2021-04-24 DIAGNOSIS — Z79899 Other long term (current) drug therapy: Secondary | ICD-10-CM | POA: Diagnosis not present

## 2021-04-24 DIAGNOSIS — I513 Intracardiac thrombosis, not elsewhere classified: Secondary | ICD-10-CM | POA: Diagnosis not present

## 2021-04-24 DIAGNOSIS — Z87891 Personal history of nicotine dependence: Secondary | ICD-10-CM | POA: Diagnosis not present

## 2021-04-24 DIAGNOSIS — I252 Old myocardial infarction: Secondary | ICD-10-CM | POA: Insufficient documentation

## 2021-04-24 DIAGNOSIS — Z951 Presence of aortocoronary bypass graft: Secondary | ICD-10-CM | POA: Insufficient documentation

## 2021-04-24 DIAGNOSIS — I255 Ischemic cardiomyopathy: Secondary | ICD-10-CM | POA: Insufficient documentation

## 2021-04-24 DIAGNOSIS — E785 Hyperlipidemia, unspecified: Secondary | ICD-10-CM | POA: Diagnosis not present

## 2021-04-24 DIAGNOSIS — I2510 Atherosclerotic heart disease of native coronary artery without angina pectoris: Secondary | ICD-10-CM | POA: Diagnosis not present

## 2021-04-24 DIAGNOSIS — I493 Ventricular premature depolarization: Secondary | ICD-10-CM | POA: Insufficient documentation

## 2021-04-24 MED ORDER — AMIODARONE HCL 200 MG PO TABS: ORAL_TABLET | ORAL | 3 refills | Status: DC

## 2021-04-24 MED ORDER — DAPAGLIFLOZIN PROPANEDIOL 10 MG PO TABS: 10.0000 mg | ORAL_TABLET | Freq: Every day | ORAL | 11 refills | Status: DC

## 2021-04-24 MED ORDER — ATORVASTATIN CALCIUM 80 MG PO TABS: 80.0000 mg | ORAL_TABLET | Freq: Every day | ORAL | 3 refills | Status: DC

## 2021-04-24 NOTE — Telephone Encounter (Signed)
Received a call from the pt's niece Trellis Moment (on [place]) on the voicemail as well; they are aware that Amio & Warfarin can cause the INR to increase. They are willing to have INR checked next since the pt will be starting Amio 200mg  twice a day for 14 days then will decrease to 200mg  daily. Advised to monitor for bleeding precautions or anything abnormal and she verbalized understanding.

## 2021-04-24 NOTE — Telephone Encounter (Signed)
-----   Message from Evon Slack, RPH-CPP sent at 04/24/2021 10:57 AM EDT ----- Hello,  I am the pharmacist at the [facility] and just saw Mr. Lightner today. Dr. Shirlee Latch placed a Zio on Mr. Sturgill and it returned w/ 20% PVCs so he has been started on amiodarone (started today in pharmacy clinic). He doesn't have a Coumadin Clinic follow up with you guys until 11/21 so I was hoping you could reach out to him and reschedule. I have also instructed him to call to reschedule but wanted to make sure you all were aware of the new DDI.   Thanks!  Leotis Shames

## 2021-04-24 NOTE — Patient Instructions (Addendum)
It was a pleasure seeing you today!  MEDICATIONS: -We are changing your medications today -Restart Farxiga 10 mg (1 tablet) daily -Start amiodarone 200 mg (1 tablet) twice daily for 14 days (until 05/07/21), then decrease amiodarone to 200 mg (1 tablet) daily.  -Please call Coumadin Clinic and let them know you have been started on amiodarone. They will need to monitor your INR more closely over the next few weeks.  -Call if you have questions about your medications.   NEXT APPOINTMENT: Return to clinic in 6 weeks with Dr. Shirlee Latch.  In general, to take care of your heart failure: -Limit your fluid intake to 2 Liters (half-gallon) per day.   -Limit your salt intake to ideally 2-3 grams (2000-3000 mg) per day. -Weigh yourself daily and record, and bring that "weight diary" to your next appointment.  (Weight gain of 2-3 pounds in 1 day typically means fluid weight.) -The medications for your heart are to help your heart and help you live longer.   -Please contact us before stopping any of your heart medications.  Call the clinic at [phone] with questions or to reschedule future appointments.

## 2021-04-24 NOTE — Progress Notes (Addendum)
PCP: Adrienne Mocha, PA Cardiology: Dr. Shirlee Latch  CT Surgeon: Dr. Cliffton Asters      HPI: 70 y.o. AAM w/ h/o anemia, ?prior seizure, former smoker (quit 70 y/o), HLD and newly diagnosed ischemic heart disease/ systolic heart failure, and s/p CABG x 4.    Admitted to [facility] in 10/2020 for acute CHF and ruled in for NSTEMi.  Diuresed w/ IV Lasix and placed on IV heparin. Echo showed severely reduced LVEF <20% w/ global HK, GIIDD and small LV apical thrombus, moderate MR and RV mildly reduced. R/LHC showed severe MVCAD (angiographic details below) and well compensated hemodynamics w/ low/normal filling pressures and preserved CO. RAP 1 mmHg, PCW 6 mmHg, CO 4.9 L/min, CI 2.5 L/min2. cMRI suggested extensive viability (except for apical septal wall and true apex) + small LV thrombus.    Underwent CABG x 4 by Dr. Cliffton Asters (LIMA-LAD, SVG-D2, SVG-OM1, SVG-PDA). Post-operative course uncomplicated. GDMT initiated prior to d/c. Also of note, LDL was markedly elevated at 204 mg/dL. Was placed on high intensity statin & Coumadin for LV thrombus.   Echo was done 04/01/2021 and reviewed, EF 30-35%, mildly decreased RV systolic function.    He returned to HF Clinic on 04/01/2021 for followup of CHF and CAD.  Symptomatically was doing well. 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  He had not had to use Lasix. Had no significant exertional dyspnea or chest pain. Had no orthopnea/PND. He reported doing cardiac rehab at [facility].   Today he returns to HF clinic with his niece for pharmacist medication titration. At last visit with MD on 04/01/2021, carvedilol was increased to 6.25 mg BID. Of note, he has been without [place], since running out of refills (~01/2021). Overall he reports feeling okay. No dizziness or lightheadedness. He reports a little fatigue, but no more than usual when walking around the warehouse at work. No chest pain or palpitations. His breathing is good and he is able do more walking  at home without stopping to rest. Some SOB at physical therapy but attributes this to having to wear a mask during sessions. Weight at home is usually around 149 lbs. He has Lasix 20 mg PRN, but has not had to take any. No LEE, PND, or orthopnea. He reports his appetite has been "crazy" since starting back to work and admits to some dietary indiscretion, but overall adheres to a low-salt diet.  HF Medications: Carvedilol 6.25 mg BID Entresto 24/26 mg BID  Spironolactone 25 mg daily Farxiga 10 mg daily - has not been taking Digoxin 0.125 mg daily Lasix 20 mg daily PRN   Has the patient been experiencing any side effects to the medications prescribed?  No  Does the patient have any problems obtaining medications due to transportation or finances?   Has Medicare A/B + [employer]. Has copay card for [facility]. Farxiga copay $15.  Understanding of regimen: good Understanding of indications: fair Potential of compliance: excellent Patient understands to avoid NSAIDs. Patient understands to avoid decongestants.    Pertinent Lab Values: 04/01/2021: Serum creatinine 1.09, BUN 21, Potassium 4.4, Sodium 139, Digoxin 0.8 ng/mL  Vital Signs: Weight: 154.6 lbs (last clinic weight: 154.6 lbs) Blood pressure: 102/62  Heart rate: 76   Assessment/Plan: 1. CAD: NSTEMI (10/2020). Cath w/ severe MVCAD. S/p LIMA-LAD, SVG-D2, SVG-OM1, SVG-PDA.  No chest pain.  - Continue ASA 81.  - Continue atorvastatin 80 mg daily.  2. Chronic Systolic Heart Failure: Ischemic CMP. EF < 20% on  echo in 10/2020. cMRI suggested extensive viability. Now s/p CABG. Echo on 04/01/2021 showed EF 30-35%.   - NYHA Class II. Euvolemic on exam. - Continue furosemide 20 mg PRN, has not had to use. - Continue carvedilol 6.25 mg BID.  - Continue Entresto 24-26 mg BID. - Continue spironolactone 25 mg daily.  - Restart Farxiga 10 mg daily.  - Continue Digoxin 0.125 mg daily.  - With persistent low EF, referred to EP for  ICD. QRS narrow, not CRT candidate. Has EP visit scheduled 05/19/21.  3. HLD: LDL 204 mg/dL on Lipid panel (07/3760). LDL goal < 70 mg/dL. Good lipids in 12/2020.  - Continue atorvastatin and Zetia.  4. LV Thrombus: small thrombus noted on cMRI. - On Coumadin. Followed by Coumadin Clinic [facility]). Sent message to Coumadin Clinic regarding new amiodarone (see below).  5. Frequent PVCs:  - Completed 7 day Zio monitor to assess: report on 04/20/21 noted frequent PVCs, 20% of total beats; short NSVT (longest 6 beats). Per Dr. Shirlee Latch, start amiodarone 200 mg BID x2 weeks then 200 mg daily. Informed Coumadin Clinic of amiodarone initiation so INR can be followed closely. Referred to EP for frequent PVC's.  Follow up with Dr. Shirlee Latch in 6 weeks on 06/05/2021.  Mitzie Na, PharmD PGY-1 [facility] Pharmacy Resident   Karle Plumber, PharmD, BCPS, BCCP, CPP Heart Failure Clinic Pharmacist [phone]

## 2021-04-29 ENCOUNTER — Other Ambulatory Visit (HOSPITAL_COMMUNITY): Payer: Self-pay | Admitting: Cardiology

## 2021-05-02 ENCOUNTER — Other Ambulatory Visit: Payer: Self-pay

## 2021-05-02 ENCOUNTER — Ambulatory Visit: Payer: Federal, State, Local not specified - PPO

## 2021-05-02 DIAGNOSIS — I513 Intracardiac thrombosis, not elsewhere classified: Secondary | ICD-10-CM

## 2021-05-02 DIAGNOSIS — Z5181 Encounter for therapeutic drug level monitoring: Secondary | ICD-10-CM

## 2021-05-02 LAB — POCT INR: INR: 5 — AB (ref 2.0–3.0)

## 2021-05-02 NOTE — Telephone Encounter (Signed)
Prescription refill request received for warfarin Lov: 12/25/20 Next INR check: 05/09/21 Warfarin tablet strength: 5mg   Appropriate dose and refill sent to requested pharmacy.

## 2021-05-02 NOTE — Patient Instructions (Signed)
-   skip warfarin tonight, tomorrow and Sunday, - START NEW DOSAGE of warfarin 1/2 tablet daily except for 1 tablet on MONDAYS, WEDNESDAYS AND FRIDAYS. -Continue leafy veggies three days a week only.  - Recheck INR in 1 week Coumadin Clinic [phone] 04/24/21- started on amiodarone 200 mg x 14 days then 200 mg

## 2021-05-09 ENCOUNTER — Other Ambulatory Visit: Payer: Self-pay

## 2021-05-09 ENCOUNTER — Ambulatory Visit: Payer: Federal, State, Local not specified - PPO | Admitting: *Deleted

## 2021-05-09 DIAGNOSIS — I513 Intracardiac thrombosis, not elsewhere classified: Secondary | ICD-10-CM | POA: Diagnosis not present

## 2021-05-09 DIAGNOSIS — Z5181 Encounter for therapeutic drug level monitoring: Secondary | ICD-10-CM | POA: Diagnosis not present

## 2021-05-09 LAB — POCT INR: INR: 2.5 (ref 2.0–3.0)

## 2021-05-09 NOTE — Patient Instructions (Signed)
Description   Continue taking warfarin 1/2 tablet daily except for 1 tablet on MONDAYS, WEDNESDAYS AND FRIDAYS. Continue leafy veggies three days a week only. Recheck INR in 10 days. Coumadin Clinic [phone] 04/24/21-started on amiodarone 200 mg twice a day x 14 days then 200mg  daily on 05/09/21.

## 2021-05-19 ENCOUNTER — Ambulatory Visit (INDEPENDENT_AMBULATORY_CARE_PROVIDER_SITE_OTHER): Payer: Federal, State, Local not specified - PPO

## 2021-05-19 ENCOUNTER — Encounter: Payer: Self-pay | Admitting: Internal Medicine

## 2021-05-19 ENCOUNTER — Other Ambulatory Visit: Payer: Self-pay

## 2021-05-19 ENCOUNTER — Ambulatory Visit: Payer: Federal, State, Local not specified - PPO | Admitting: Internal Medicine

## 2021-05-19 DIAGNOSIS — I513 Intracardiac thrombosis, not elsewhere classified: Secondary | ICD-10-CM

## 2021-05-19 DIAGNOSIS — Z5181 Encounter for therapeutic drug level monitoring: Secondary | ICD-10-CM

## 2021-05-19 DIAGNOSIS — I255 Ischemic cardiomyopathy: Secondary | ICD-10-CM | POA: Insufficient documentation

## 2021-05-19 LAB — POCT INR: INR: 4.1 — AB (ref 2.0–3.0)

## 2021-05-19 NOTE — Progress Notes (Signed)
HPI [name] is referred by Dr. Aundra Dubin for consideration of ICD insertion. He has a history of ischemic heart disease and his chronic systolic heart failure status post bypass surgery.  He has undergone repeat echo postoperatively and found despite maximal medical therapy to have an ejection fraction of 30% by echo.  The patient does not have active angina.  He denies syncope.  Prior to revascularization, his ejection fraction was 15% by MRI.  Most recently by echo it is been 30%.  He has been under maximal guideline directed medical therapy. Allergies  Allergen Reactions   Penicillins Other (See Comments)    childhood     Current Outpatient Medications  Medication Sig Dispense Refill   amiodarone (PACERONE) 200 MG tablet Take 200 mg (1 tablet) twice daily for 14 days, then decrease to 200 mg (1 tab) once daily 44 tablet 3   aspirin EC 81 MG EC tablet Take 1 tablet (81 mg total) by mouth daily. Swallow whole. 30 tablet 11   atorvastatin (LIPITOR) 80 MG tablet Take 1 tablet (80 mg total) by mouth daily. 90 tablet 3   carvedilol (COREG) 6.25 MG tablet Take 1 tablet (6.25 mg total) by mouth 2 (two) times daily with a meal. 180 tablet 3   dapagliflozin propanediol (FARXIGA) 10 MG TABS tablet Take 1 tablet (10 mg total) by mouth daily. 30 tablet 11   digoxin (LANOXIN) 0.125 MG tablet TAKE ONE TABLET BY MOUTH DAILY 30 tablet 3   ezetimibe (ZETIA) 10 MG tablet Take 1 tablet (10 mg total) by mouth daily. 90 tablet 3   furosemide (LASIX) 20 MG tablet Take 1 tablet (20 mg total) by mouth daily as needed for fluid or edema. 45 tablet 3   Multiple Vitamin (MULTIVITAMIN WITH MINERALS) TABS tablet Take 1 tablet by mouth daily.     sacubitril-valsartan (ENTRESTO) 24-26 MG Take 1 tablet by mouth 2 (two) times daily. 60 tablet 11   spironolactone (ALDACTONE) 25 MG tablet TAKE ONE TABLET BY MOUTH DAILY 30 tablet 3   warfarin (COUMADIN) 5 MG tablet TAKE 1 TO 2 TABLETS DAILY OR AS DIRECTED BY THE  COUMADIN CLINIC 50 tablet 0   No current facility-administered medications for this visit.     Past Medical History:  Diagnosis Date   Anemia    Asthma    childhood   Blood in stool    Childhood asthma    GI bleed    Kidney laceration 1999   Seizures ([place])     ROS:   All systems reviewed and negative except as noted in the HPI.   Past Surgical History:  Procedure Laterality Date   CORONARY ARTERY BYPASS GRAFT N/A 11/05/2020   Procedure: CORONARY ARTERY BYPASS GRAFTING (CABG);  Surgeon: Lajuana Matte, MD;  Location: [facility];  Service: Open Heart Surgery;  Laterality: N/A;  WITH POSSIBLE BALLOON PUMP   KIDNEY SURGERY     for laceration   RIGHT/LEFT HEART CATH AND CORONARY ANGIOGRAPHY N/A 10/29/2020   Procedure: RIGHT/LEFT HEART CATH AND CORONARY ANGIOGRAPHY;  Surgeon: Nigel Mormon, MD;  Location: [facility];  Service: Cardiovascular;  Laterality: N/A;   TEE WITHOUT CARDIOVERSION N/A 11/05/2020   Procedure: TRANSESOPHAGEAL ECHOCARDIOGRAM (TEE);  Surgeon: Lajuana Matte, MD;  Location: [facility];  Service: Open Heart Surgery;  Laterality: N/A;     Family History  Problem Relation Age of Onset   Cervical cancer Mother    Hypertension Father  Colon cancer Neg Hx    Pancreatic cancer Neg Hx    Esophageal cancer Neg Hx    Stomach cancer Neg Hx      Social History   Socioeconomic History   Marital status: Widowed    Spouse name: Not on file   Number of children: Not on file   Years of education: Not on file   Highest education level: Not on file  Occupational History   Occupation: truck driver  Tobacco Use   Smoking status: Former   Smokeless tobacco: Never   Tobacco comments:    quit in 90's  Substance and Sexual Activity   Alcohol use: Yes    Comment: 1 beer/month   Drug use: No   Sexual activity: Not on file  Other Topics Concern   Not on file  Social History Narrative   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     Social Determinants  of Health   Financial Resource Strain: Low Risk    Difficulty of Paying Living Expenses: Not very hard  Food Insecurity: No Food Insecurity   Worried About Programme researcher, broadcasting/film/video in the Last Year: Never true   Ran Out of Food in the Last Year: Never true  Transportation Needs: No Transportation Needs   Lack of Transportation (Medical): No   Lack of Transportation (Non-Medical): No  Physical Activity: Not on file  Stress: Not on file  Social Connections: Not on file  Intimate Partner Violence: Not on file     BP 100/60   Pulse (!) 54   Ht 6' (1.829 m)   Wt 158 lb (71.7 kg)   SpO2 99%   BMI 21.43 kg/m   Physical Exam:  Well appearing NAD HEENT: Unremarkable Neck:  No JVD, no thyromegally Lymphatics:  No adenopathy Back:  No CVA tenderness Lungs:  Clear, with no wheezes, rales, or rhonchi HEART:  Regular rate rhythm, no murmurs, no rubs, no clicks Abd:  soft, positive bowel sounds, no organomegally, no rebound, no guarding Ext:  2 plus pulses, no edema, no cyanosis, no clubbing Skin:  No rashes no nodules Neuro:  CN II through XII intact, motor grossly intact  EKG -sinus bradycardia with prior anterior myocardial infarction  Assess/Plan:  1.  Ischemic cardiomyopathy -he is status post bypass surgery with revascularization.  He denies anginal symptoms. 2.  Chronic systolic heart failure -the patient's heart failure symptoms are class II and he is euvolemic.  He is on maximal medical therapy under the direction of the heart failure clinic.  I have discussed the treatment options in detail with the patient.  The risk, goals, benefits, and expectations of ICD insertion were reviewed in detail.  He will call us if he would like to proceed with ICD insertion. 3.  Dyslipidemia -he will continue his statin therapy.  He will maintain a low-fat diet.  His LDL was at goal on lipid panel back in July.  Lewayne Bunting, MD

## 2021-05-19 NOTE — Patient Instructions (Addendum)
Medication Instructions:  Your physician recommends that you continue on your current medications as directed. Please refer to the Current Medication list given to you today.  Labwork: None ordered.  Testing/Procedures: None ordered.  Follow-Up:  SEE INSTRUCTION LETTER  Any Other Special Instructions Will Be Listed Below (If Applicable).  If you need a refill on your cardiac medications before your next appointment, please call your pharmacy.    

## 2021-05-19 NOTE — Patient Instructions (Signed)
-   skip warfarin tonight and tomorrow, then  - start taking warfarin 1/2 tablet daily except for 1 tablet on MONDAYS AND FRIDAYS.  - Continue leafy veggies three days a week only.  - Recheck INR in 2 weeks Coumadin Clinic [phone] 04/24/21-started on amiodarone 200 mg twice a day x 14 days then 200mg  daily on 05/09/21.

## 2021-05-27 ENCOUNTER — Other Ambulatory Visit (HOSPITAL_COMMUNITY): Payer: Self-pay | Admitting: Cardiology

## 2021-06-02 ENCOUNTER — Ambulatory Visit: Payer: Federal, State, Local not specified - PPO | Admitting: *Deleted

## 2021-06-02 ENCOUNTER — Other Ambulatory Visit: Payer: Self-pay

## 2021-06-02 DIAGNOSIS — Z5181 Encounter for therapeutic drug level monitoring: Secondary | ICD-10-CM | POA: Diagnosis not present

## 2021-06-02 LAB — POCT INR: INR: 3.7 — AB (ref 2.0–3.0)

## 2021-06-02 NOTE — Patient Instructions (Addendum)
Description   -- Hold warfarin today.  - start taking warfarin 1/2 tablet daily except for 1 tablet on FRIDAYS.  - Continue leafy veggies three days a week only.  - Recheck INR in 2 weeks Coumadin Clinic [phone] 04/24/21-started on amiodarone 200 mg twice a day x 14 days then 200mg  daily on 05/09/21.

## 2021-06-05 ENCOUNTER — Ambulatory Visit (HOSPITAL_COMMUNITY)
Admission: RE | Admit: 2021-06-05 | Discharge: 2021-06-05 | Disposition: A | Discharge status: Home / Self Care | Payer: Federal, State, Local not specified - PPO | Source: Ambulatory Visit | Attending: Cardiology | Admitting: Cardiology

## 2021-06-05 ENCOUNTER — Encounter (HOSPITAL_COMMUNITY): Payer: Federal, State, Local not specified - PPO | Admitting: Cardiology

## 2021-06-05 ENCOUNTER — Other Ambulatory Visit: Payer: Self-pay

## 2021-06-05 ENCOUNTER — Encounter (HOSPITAL_COMMUNITY): Payer: Self-pay | Admitting: Cardiology

## 2021-06-05 ENCOUNTER — Other Ambulatory Visit (HOSPITAL_COMMUNITY): Payer: Self-pay | Admitting: Cardiology

## 2021-06-05 VITALS — BP 100/58 | HR 54 | Wt 155.4 lb

## 2021-06-05 DIAGNOSIS — I5020 Unspecified systolic (congestive) heart failure: Secondary | ICD-10-CM | POA: Diagnosis not present

## 2021-06-05 DIAGNOSIS — I513 Intracardiac thrombosis, not elsewhere classified: Secondary | ICD-10-CM | POA: Diagnosis not present

## 2021-06-05 DIAGNOSIS — Z87891 Personal history of nicotine dependence: Secondary | ICD-10-CM | POA: Diagnosis not present

## 2021-06-05 DIAGNOSIS — Z951 Presence of aortocoronary bypass graft: Secondary | ICD-10-CM | POA: Insufficient documentation

## 2021-06-05 DIAGNOSIS — E785 Hyperlipidemia, unspecified: Secondary | ICD-10-CM | POA: Diagnosis not present

## 2021-06-05 DIAGNOSIS — E782 Mixed hyperlipidemia: Secondary | ICD-10-CM | POA: Diagnosis not present

## 2021-06-05 DIAGNOSIS — Z79899 Other long term (current) drug therapy: Secondary | ICD-10-CM | POA: Insufficient documentation

## 2021-06-05 DIAGNOSIS — I5022 Chronic systolic (congestive) heart failure: Secondary | ICD-10-CM | POA: Diagnosis not present

## 2021-06-05 DIAGNOSIS — I493 Ventricular premature depolarization: Secondary | ICD-10-CM | POA: Diagnosis not present

## 2021-06-05 DIAGNOSIS — I2510 Atherosclerotic heart disease of native coronary artery without angina pectoris: Secondary | ICD-10-CM | POA: Diagnosis not present

## 2021-06-05 DIAGNOSIS — I252 Old myocardial infarction: Secondary | ICD-10-CM | POA: Diagnosis not present

## 2021-06-05 LAB — COMPREHENSIVE METABOLIC PANEL
ALT: 42 U/L (ref 0–44)
AST: 31 U/L (ref 15–41)
Albumin: 4.2 g/dL (ref 3.5–5.0)
Alkaline Phosphatase: 57 U/L (ref 38–126)
Anion gap: 8 (ref 5–15)
BUN: 26 mg/dL — ABNORMAL HIGH (ref 8–23)
CO2: 26 mmol/L (ref 22–32)
Calcium: 9.7 mg/dL (ref 8.9–10.3)
Chloride: 104 mmol/L (ref 98–111)
Creatinine, Ser: 1.42 mg/dL — ABNORMAL HIGH (ref 0.61–1.24)
GFR, Estimated: 53 mL/min — ABNORMAL LOW (ref >60)
Glucose, Bld: 101 mg/dL — ABNORMAL HIGH (ref 70–99)
Potassium: 5.4 mmol/L — ABNORMAL HIGH (ref 3.5–5.1)
Sodium: 138 mmol/L (ref 135–145)
Total Bilirubin: 0.6 mg/dL (ref 0.3–1.2)
Total Protein: 7.3 g/dL (ref 6.5–8.1)

## 2021-06-05 LAB — DIGOXIN LEVEL: Digoxin Level: 1 ng/mL (ref 0.8–2.0)

## 2021-06-05 LAB — LIPID PANEL
Cholesterol: 122 mg/dL (ref 0–200)
HDL: 58 mg/dL (ref >40)
LDL Cholesterol: 41 mg/dL (ref 0–99)
Total CHOL/HDL Ratio: 2.1 RATIO
Triglycerides: 115 mg/dL (ref <150)
VLDL: 23 mg/dL (ref 0–40)

## 2021-06-05 LAB — TSH: TSH: 0.978 u[IU]/mL (ref 0.350–4.500)

## 2021-06-05 NOTE — Patient Instructions (Addendum)
Labs done today. We will contact you only if your labs are abnormal.  No medication changes were made. Please continue all current medications as prescribed.  Your provider has recommended that  you wear a Zio Patch for 3 days.  This monitor will record your heart rhythm for our review.  IF you have any symptoms while wearing the monitor please press the button.  If you have any issues with the patch or you notice a red or orange light on it please call the company at [phone].  Once you remove the patch please mail it back to the company as soon as possible so we can get the results.  Your physician recommends that you schedule a follow-up appointment in: 3 months with our NP/PA Clinic here in our office   If you have any questions or concerns before your next appointment please send [place] a message through [place] or call our office at [phone].    TO LEAVE A MESSAGE FOR THE NURSE SELECT OPTION 2, PLEASE LEAVE A MESSAGE INCLUDING: YOUR NAME DATE OF BIRTH CALL BACK NUMBER REASON FOR CALL**this is important as we prioritize the call backs  YOU WILL RECEIVE A CALL BACK THE SAME DAY AS LONG AS YOU CALL BEFORE 4:00 PM   Do the following things EVERYDAY: Weigh yourself in the morning before breakfast. Write it down and keep it in a log. Take your medicines as prescribed Eat low salt foods--Limit salt (sodium) to 2000 mg per day.  Stay as active as you can everyday Limit all fluids for the day to less than 2 liters   At the [facility], you and your health needs are our priority. As part of our continuing mission to provide you with exceptional heart care, we have created designated Provider Care Teams. These Care Teams include your primary Cardiologist (physician) and Advanced Practice Providers (APPs- Physician Assistants and Nurse Practitioners) who all work together to provide you with the care you need, when you need it.   You may see any of the following  providers on your designated Care Team at your next follow up: Dr Arvilla Meres Dr Carron Curie, NP Robbie Lis, [place] Karle Plumber, PharmD   Please be sure to bring in all your medications bottles to every appointment.

## 2021-06-05 NOTE — Progress Notes (Signed)
[facility] Note   PCP: Ephriam Jenkins, PA (Inactive) Cardiology: Dr. Aundra Dubin  CT Surgeon: Dr. Kipp Brood   Reason for Visit: F/u for Systolic Heart Failure 2/2 ICM s/p CABG   HPI:  70 y.o. AAM w/ h/o anemia, ?prior seizure, former smoker (quit 70 y/o), HLD and newly diagnosed ischemic heart disease/ systolic heart failure, and s/p CABG x 4.   Admitted to [facility] 5/22 for acute CHF and ruled in for NSTEMi.  Diuresed w/ IV lasix and placed on IV heparin. Echo showed severely reduced LVEF <20% w/ global HK, GIIDD and small LV apical thrombus, moderate MR and RV mildly reduced. R/LHC showed severe MVCAD (angiographic details below) and well compensated hemodynamics w/ low/normal filling pressures and preserved CO. RAP 1 mmHg, PCW 6 mmHg, CO 4.9 L/min, CI 2.5 L/min2. cMRI suggested extensive viability (except for apical septal wall and true apex) + small LV thrombus.   Underwent CABG x 4 by Dr. Kipp Brood (LIMA-LAD, SVG-D2, SVG-OM1, SVG-PDA). Post-operative course uncomplicated. GDMT initiated prior to d/c. Also of note, LDL was markedly elevated at 204 mg/dL. Was placed on high intensity statin &  Coumadin for LV thrombus.  Echo in 10/22 showed EF 30-35%, mildly decreased RV systolic function.   Zio patch in 10/22 showed 20% PVCs with short NSVT runs, amiodarone was started.   He returns today for followup of CHF and CAD.  Symptomatically doing well.  Working full time.  No significant dyspnea.  Can climb a flight of stairs with no problems.  Rare lightheadedness with standing.  No chest pain.  No orthopnea/PND. He has not had to take Lasix.  Weight stable.  No BRBPR/melena.   Labs (7/22): K 4.3, creatinine 1.23, LDL 49, TGs 60, digoxin 0.7 Labs (10/22): digoxin 0.8, K 4.4, creatinine 1.09  PMH: 1. LV thrombus.  2. CAD: NSTEMI in 5/22 with severe 3 vessel disease on cath.  - CABG (5/22) with LIMA-LAD, SVG-D2, SVG-OM1, SVG-PDA.   3. Hyperlipidemia.  4. Chronic systolic CHF:  Ischemic cardiomyopathy.  - Echo (5/22): EF <20%, LV severely decreased, Grade II DD, small LV apical thrombus, RV mildly reduced. - cMRI (5/22): Severe LV enlargement with severe global hypokinesis with regional variation. EF 14%, small apical LV thrombus, normal RV size with mildly decreased systolic function, EF [id number], moderate MR, Based on severe LV dysfunction, there was less late gadolinium enhancement than I would have expected. With the exception of the apical septal wall and the true apex, would expect the remainder of the LV myocardium to be viable. - Echo (10/22): EF 30-35%, global hypokinesis, mildly decreased RV systolic function, IVC normal, no LV thrombus.  5. PVCs: Zio monitor (10/22) with 10% PVCs.   Review of systems complete and found to be negative unless listed in HPI.      Current Outpatient Medications  Medication Sig Dispense Refill   amiodarone (PACERONE) 200 MG tablet Take 200 mg by mouth daily.     aspirin EC 81 MG EC tablet Take 1 tablet (81 mg total) by mouth daily. Swallow whole. 30 tablet 11   atorvastatin (LIPITOR) 80 MG tablet Take 1 tablet (80 mg total) by mouth daily. 90 tablet 3   carvedilol (COREG) 6.25 MG tablet Take 1 tablet (6.25 mg total) by mouth 2 (two) times daily with a meal. 180 tablet 3   dapagliflozin propanediol (FARXIGA) 10 MG TABS tablet Take 1 tablet (10 mg total) by mouth daily. 30 tablet 11   digoxin (LANOXIN) 0.125 MG tablet  TAKE ONE TABLET BY MOUTH DAILY 30 tablet 3   ezetimibe (ZETIA) 10 MG tablet Take 1 tablet (10 mg total) by mouth daily. 90 tablet 3   furosemide (LASIX) 20 MG tablet Take 1 tablet (20 mg total) by mouth daily as needed for fluid or edema. 45 tablet 3   Multiple Vitamin (MULTIVITAMIN WITH MINERALS) TABS tablet Take 1 tablet by mouth daily.     sacubitril-valsartan (ENTRESTO) 24-26 MG Take 1 tablet by mouth 2 (two) times daily. 60 tablet 11   spironolactone (ALDACTONE) 25 MG tablet TAKE ONE TABLET BY MOUTH DAILY 30 tablet 3    warfarin (COUMADIN) 5 MG tablet Take 1/2 to 1 tablet once daily as directed by the coumadin clinic 30 tablet 3   No current facility-administered medications for this encounter.   Allergies  Allergen Reactions   Penicillins Other (See Comments)    childhood   Social History   Socioeconomic History   Marital status: Widowed    Spouse name: Not on file   Number of children: Not on file   Years of education: Not on file   Highest education level: Not on file  Occupational History   Occupation: truck driver  Tobacco Use   Smoking status: Former   Smokeless tobacco: Never   Tobacco comments:    quit in 90's  Substance and Sexual Activity   Alcohol use: Yes    Comment: 1 beer/month   Drug use: No   Sexual activity: Not on file  Other Topics Concern   Not on file  Social History Narrative   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     Social Determinants of Health   Financial Resource Strain: Low Risk    Difficulty of Paying Living Expenses: Not very hard  Food Insecurity: No Food Insecurity   Worried About Charity fundraiser in the Last Year: Never true   Ran Out of Food in the Last Year: Never true  Transportation Needs: No Transportation Needs   Lack of Transportation (Medical): No   Lack of Transportation (Non-Medical): No  Physical Activity: Not on file  Stress: Not on file  Social Connections: Not on file  Intimate Partner Violence: Not on file   Family History  Problem Relation Age of Onset   Cervical cancer Mother    Hypertension Father    Colon cancer Neg Hx    Pancreatic cancer Neg Hx    Esophageal cancer Neg Hx    Stomach cancer Neg Hx    BP (!) 100/58   Pulse (!) 54   Wt 70.5 kg (155 lb 6.4 oz)   SpO2 98%   BMI 21.08 kg/m   Wt Readings from Last 3 Encounters:  06/05/21 70.5 kg (155 lb 6.4 oz)  05/19/21 71.7 kg (158 lb)  04/24/21 70.1 kg (154 lb 9.6 oz)   General: NAD Neck: No JVD, no thyromegaly or thyroid nodule.  Lungs: Clear to  auscultation bilaterally with normal respiratory effort. CV: Nondisplaced PMI.  Heart regular S1/S2, no S3/S4, no murmur.  No peripheral edema.  No carotid bruit.  Normal pedal pulses.  Abdomen: Soft, nontender, no hepatosplenomegaly, no distention.  Skin: Intact without lesions or rashes.  Neurologic: Alert and oriented x 3.  Psych: Normal affect. Extremities: No clubbing or cyanosis.  HEENT: Normal.   ASSESSMENT & PLAN: 1. CAD: NSTEMI (5/22). Cath w/ severe MVCAD. S/p LIMA-LAD, SVG-D2, SVG-OM1, SVG-PDA.  No chest pain.  - Continue ASA 81.  - Continue atorvastatin 80 mg  daily. Check lipids today.  2. Chronic Systolic Heart Failure: Ischemic CMP. EF < 20% on echo in 5/22. cMRI suggested extensive viability. Now s/p CABG. Echo in 10/22 showed EF 30-35%.  NYHA Class II. Volume status stable. High percentage of PVCs may have contributed to persistent low EF.  - Continue Lasix 20 mg prn, has not had to use. - Continue Entresto 24-26 mg bid. BMET today.  Does not appear to have BP room to increase.  - Continue Farxiga 10 mg daily.  - Continue spiro 25 mg daily.  - Continue Digoxin 0.125 mg daily. Check digoxin level today. - Continue Coreg 6.25 mg bid.  Does not appear to have BP room to increase.  - He is scheduled to have ICD implanted in 1/23. QRS narrow, not CRT candidate.  - Continue amiodarone to suppress PVCs.  3. HLD: LDL 204 mg/dL on Lipid panel (6/27). LDL goal < 70 mg/dL.  - Continue atorvastatin and Zetia, check lipids today.  4. LV Thrombus: small thrombus noted on cMRI. - On Coumadin. Followed by [facility].  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  - Continue amiodarone, check LFTs and TSH today.  He will need a regular eye exam.  - Repeat Zio monitor x 3 days to make sure PVCs are decreasing.   Followup 3 months with APP, if BP stable would like to increase Entresto.    Marca Ancona, MD 06/05/21

## 2021-06-10 ENCOUNTER — Other Ambulatory Visit (HOSPITAL_COMMUNITY): Payer: Self-pay | Admitting: Cardiology

## 2021-06-11 ENCOUNTER — Telehealth (HOSPITAL_COMMUNITY): Payer: Self-pay

## 2021-06-11 DIAGNOSIS — I5020 Unspecified systolic (congestive) heart failure: Secondary | ICD-10-CM

## 2021-06-11 MED ORDER — DIGOXIN 125 MCG PO TABS
0.0625 mg | ORAL_TABLET | Freq: Every day | ORAL | 3 refills | Status: DC
Start: 2021-06-11 — End: 2021-07-24

## 2021-06-11 NOTE — Telephone Encounter (Signed)
Patient and niece advised and verbalized understanding,lab appointment scheduled,lab orders entered, med list updated to reflect changes.   Meds ordered this encounter  Medications   digoxin (LANOXIN) 0.125 MG tablet    Sig: Take 0.5 tablets (0.0625 mg total) by mouth daily.    Dispense:  30 tablet    Refill:  3   Orders Placed This Encounter  Procedures   Basic metabolic panel    Standing Status:   Future    Standing Expiration Date:   06/11/2022    Order Specific Question:   Release to patient    Answer:   Immediate

## 2021-06-11 NOTE — Telephone Encounter (Signed)
-----   Message from Laurey Morale, MD sent at 06/06/2021  9:32 AM EST ----- Good lipids.  K is mildly high, cut back on dietary K and repeat BMET next week.  Would also decrease digoxin to 0.0625 mg daily.

## 2021-06-13 ENCOUNTER — Ambulatory Visit: Payer: Federal, State, Local not specified - PPO

## 2021-06-13 ENCOUNTER — Other Ambulatory Visit: Payer: Self-pay

## 2021-06-13 DIAGNOSIS — Z5181 Encounter for therapeutic drug level monitoring: Secondary | ICD-10-CM

## 2021-06-13 LAB — POCT INR: INR: 2.6 (ref 2.0–3.0)

## 2021-06-13 NOTE — Patient Instructions (Signed)
Description   - Continue taking warfarin 1/2 tablet daily except for 1 tablet on FRIDAYS.  - Continue leafy veggies two days a week only.  - Recheck INR in 3 weeks Coumadin Clinic [phone] 04/24/21-started on amiodarone 200 mg twice a day x 14 days then 200mg  daily on 05/09/21.

## 2021-06-20 ENCOUNTER — Other Ambulatory Visit: Payer: Self-pay

## 2021-06-20 ENCOUNTER — Ambulatory Visit (HOSPITAL_COMMUNITY)
Admission: RE | Admit: 2021-06-20 | Discharge: 2021-06-20 | Disposition: A | Discharge status: Home / Self Care | Payer: Federal, State, Local not specified - PPO | Source: Ambulatory Visit | Attending: Internal Medicine | Admitting: Internal Medicine

## 2021-06-20 ENCOUNTER — Telehealth (HOSPITAL_COMMUNITY): Payer: Self-pay

## 2021-06-20 ENCOUNTER — Telehealth (HOSPITAL_COMMUNITY): Payer: Self-pay | Admitting: Surgery

## 2021-06-20 DIAGNOSIS — I5020 Unspecified systolic (congestive) heart failure: Secondary | ICD-10-CM | POA: Insufficient documentation

## 2021-06-20 LAB — BASIC METABOLIC PANEL
Anion gap: 5 (ref 5–15)
BUN: 27 mg/dL — ABNORMAL HIGH (ref 8–23)
CO2: 27 mmol/L (ref 22–32)
Calcium: 9.1 mg/dL (ref 8.9–10.3)
Chloride: 105 mmol/L (ref 98–111)
Creatinine, Ser: 1.66 mg/dL — ABNORMAL HIGH (ref 0.61–1.24)
GFR, Estimated: 44 mL/min — ABNORMAL LOW (ref >60)
Glucose, Bld: 87 mg/dL (ref 70–99)
Potassium: 4.1 mmol/L (ref 3.5–5.1)
Sodium: 137 mmol/L (ref 135–145)

## 2021-06-20 NOTE — Telephone Encounter (Signed)
-----   Message from Laurey Morale, MD sent at 06/20/2021  2:19 PM EST ----- Increase hydration, avoid Lasix use.  BMET 10 days.

## 2021-06-20 NOTE — Telephone Encounter (Signed)
Patient called and informed about results and recommendations per provider Marca Ancona.  Order for BMET to recheck added to labs scheduled at [facility] on Jan 3rd.

## 2021-07-04 ENCOUNTER — Other Ambulatory Visit: Payer: Federal, State, Local not specified - PPO | Admitting: *Deleted

## 2021-07-04 ENCOUNTER — Other Ambulatory Visit: Payer: Self-pay

## 2021-07-04 ENCOUNTER — Telehealth: Payer: Self-pay | Admitting: Internal Medicine

## 2021-07-04 ENCOUNTER — Ambulatory Visit: Payer: Federal, State, Local not specified - PPO | Admitting: *Deleted

## 2021-07-04 DIAGNOSIS — I513 Intracardiac thrombosis, not elsewhere classified: Secondary | ICD-10-CM | POA: Diagnosis not present

## 2021-07-04 DIAGNOSIS — Z5181 Encounter for therapeutic drug level monitoring: Secondary | ICD-10-CM | POA: Diagnosis not present

## 2021-07-04 DIAGNOSIS — I255 Ischemic cardiomyopathy: Secondary | ICD-10-CM

## 2021-07-04 LAB — CBC WITH DIFFERENTIAL/PLATELET
Basophils Absolute: 0 10*3/uL (ref 0.0–0.2)
Basos: 1 %
EOS (ABSOLUTE): 0.2 10*3/uL (ref 0.0–0.4)
Eos: 6 %
Hematocrit: 32.2 % — ABNORMAL LOW (ref 37.5–51.0)
Hemoglobin: 10.8 g/dL — ABNORMAL LOW (ref 13.0–17.7)
Immature Grans (Abs): 0 10*3/uL (ref 0.0–0.1)
Immature Granulocytes: 0 %
Lymphocytes Absolute: 1.5 10*3/uL (ref 0.7–3.1)
Lymphs: 40 %
MCH: 33.1 pg — ABNORMAL HIGH (ref 26.6–33.0)
MCHC: 33.5 g/dL (ref 31.5–35.7)
MCV: 99 fL — ABNORMAL HIGH (ref 79–97)
Monocytes Absolute: 0.5 10*3/uL (ref 0.1–0.9)
Monocytes: 12 %
Neutrophils Absolute: 1.6 10*3/uL (ref 1.4–7.0)
Neutrophils: 41 %
Platelets: 223 10*3/uL (ref 150–450)
RBC: 3.26 x10E6/uL — ABNORMAL LOW (ref 4.14–5.80)
RDW: 14.8 % (ref 11.6–15.4)
WBC: 3.8 10*3/uL (ref 3.4–10.8)

## 2021-07-04 LAB — BASIC METABOLIC PANEL
BUN/Creatinine Ratio: 15 (ref 10–24)
BUN: 21 mg/dL (ref 8–27)
CO2: 25 mmol/L (ref 20–29)
Calcium: 9 mg/dL (ref 8.6–10.2)
Chloride: 107 mmol/L — ABNORMAL HIGH (ref 96–106)
Creatinine, Ser: 1.44 mg/dL — ABNORMAL HIGH (ref 0.76–1.27)
Glucose: 97 mg/dL (ref 70–99)
Potassium: 4.1 mmol/L (ref 3.5–5.2)
Sodium: 142 mmol/L (ref 134–144)
eGFR: 52 mL/min/{1.73_m2} — ABNORMAL LOW (ref >59)

## 2021-07-04 LAB — POCT INR: INR: 2.6 (ref 2.0–3.0)

## 2021-07-04 NOTE — Telephone Encounter (Signed)
Wife thought date of procedure was 07/30/20. The patient prefers to have the procedure on Fridays but would be OK with taking a Monday appt.  He works part time and works Insurance account manager

## 2021-07-04 NOTE — Patient Instructions (Signed)
Description   Continue taking warfarin 1/2 tablet daily except for 1 tablet on FRIDAYS. Continue leafy veggies two days a week only. Recheck INR in 4 weeks. Coumadin Clinic [phone] 04/24/21-started on amiodarone 200 mg twice a day x 14 days then 200mg  daily on 05/09/21.  Main Number [phone]

## 2021-07-07 NOTE — Telephone Encounter (Signed)
Returned call to [facility] and Pt.  Pt rescheduled for 08/01/21 at 9:30 am  Updated and mailed instruction letter per request.

## 2021-07-20 ENCOUNTER — Other Ambulatory Visit (HOSPITAL_COMMUNITY): Payer: Self-pay | Admitting: Internal Medicine

## 2021-07-24 ENCOUNTER — Other Ambulatory Visit (HOSPITAL_COMMUNITY): Payer: Self-pay | Admitting: Cardiology

## 2021-07-28 NOTE — Telephone Encounter (Signed)
Spoke with the pt/ niece per his request and instructions sent to the pts My Chart for his device implant 08/01/21.

## 2021-07-28 NOTE — Telephone Encounter (Signed)
Patient would like a copy of his pre-procedure instructions sent to his MyChart in case the mail does not arrive in time.  He is concerned about what medications he should and should not take the day of the procedure

## 2021-07-30 ENCOUNTER — Ambulatory Visit: Payer: Federal, State, Local not specified - PPO

## 2021-07-31 NOTE — Pre-Procedure Instructions (Signed)
Spoke with patient's niece, Trellis Moment regarding procedure instructions.   Arrival time 0730 Nothing to eat or drink after midnight No meds AM of procedure Responsible person to drive you home and stay with you for 24 hrs Wash with special soap night before and morning of procedure If on anti-coagulant drug instructions Coumadin-last dose 1/31

## 2021-08-01 ENCOUNTER — Other Ambulatory Visit: Payer: Self-pay

## 2021-08-01 ENCOUNTER — Encounter (HOSPITAL_COMMUNITY)
Admission: RE | Disposition: A | Discharge status: Home / Self Care | Payer: Federal, State, Local not specified - PPO | Source: Ambulatory Visit | Attending: Internal Medicine

## 2021-08-01 ENCOUNTER — Ambulatory Visit (HOSPITAL_COMMUNITY)
Admission: RE | Admit: 2021-08-01 | Discharge: 2021-08-01 | Disposition: A | Discharge status: Home / Self Care | Payer: Federal, State, Local not specified - PPO | Source: Ambulatory Visit | Attending: Internal Medicine | Admitting: Internal Medicine

## 2021-08-01 ENCOUNTER — Ambulatory Visit (HOSPITAL_COMMUNITY): Payer: Federal, State, Local not specified - PPO

## 2021-08-01 DIAGNOSIS — E785 Hyperlipidemia, unspecified: Secondary | ICD-10-CM | POA: Diagnosis not present

## 2021-08-01 DIAGNOSIS — I5022 Chronic systolic (congestive) heart failure: Secondary | ICD-10-CM | POA: Diagnosis not present

## 2021-08-01 DIAGNOSIS — Z951 Presence of aortocoronary bypass graft: Secondary | ICD-10-CM | POA: Insufficient documentation

## 2021-08-01 DIAGNOSIS — I252 Old myocardial infarction: Secondary | ICD-10-CM | POA: Diagnosis not present

## 2021-08-01 DIAGNOSIS — Z87891 Personal history of nicotine dependence: Secondary | ICD-10-CM | POA: Diagnosis not present

## 2021-08-01 DIAGNOSIS — Z95810 Presence of automatic (implantable) cardiac defibrillator: Secondary | ICD-10-CM

## 2021-08-01 DIAGNOSIS — I255 Ischemic cardiomyopathy: Secondary | ICD-10-CM | POA: Diagnosis present

## 2021-08-01 HISTORY — PX: ICD IMPLANT: EP1208

## 2021-08-01 LAB — PROTIME-INR
INR: 2.1 — ABNORMAL HIGH (ref 0.8–1.2)
Prothrombin Time: 23.7 seconds — ABNORMAL HIGH (ref 11.4–15.2)

## 2021-08-01 SURGERY — ICD IMPLANT

## 2021-08-01 MED ORDER — VANCOMYCIN HCL IN DEXTROSE 1-5 GM/200ML-% IV SOLN
1000.0000 mg | Freq: Once | INTRAVENOUS | Status: AC
  Administered 2021-08-01: 1000 mg via INTRAVENOUS
  Filled 2021-08-01: qty 200

## 2021-08-01 MED ORDER — FENTANYL CITRATE (PF) 100 MCG/2ML IJ SOLN
INTRAMUSCULAR | Status: AC
  Filled 2021-08-01: qty 2

## 2021-08-01 MED ORDER — MIDAZOLAM HCL 5 MG/5ML IJ SOLN
INTRAMUSCULAR | Status: AC
  Filled 2021-08-01: qty 5

## 2021-08-01 MED ORDER — LIDOCAINE HCL 1 % IJ SOLN
INTRAMUSCULAR | Status: AC
  Filled 2021-08-01: qty 60

## 2021-08-01 MED ORDER — CHLORHEXIDINE GLUCONATE 4 % EX LIQD: 4.0000 "application " | Freq: Once | CUTANEOUS | Status: DC

## 2021-08-01 MED ORDER — FENTANYL CITRATE (PF) 100 MCG/2ML IJ SOLN
INTRAMUSCULAR | Status: DC | PRN
  Administered 2021-08-01 (×2): 12.5 ug via INTRAVENOUS
  Administered 2021-08-01: 25 ug via INTRAVENOUS

## 2021-08-01 MED ORDER — GENTAMICIN SULFATE 40 MG/ML IJ SOLN
80.0000 mg | INTRAMUSCULAR | Status: AC
  Administered 2021-08-01: 80 mg
  Filled 2021-08-01: qty 2

## 2021-08-01 MED ORDER — HEPARIN (PORCINE) IN NACL 1000-0.9 UT/500ML-% IV SOLN
INTRAVENOUS | Status: AC
  Filled 2021-08-01: qty 500

## 2021-08-01 MED ORDER — ACETAMINOPHEN 325 MG PO TABS: 325.0000 mg | ORAL_TABLET | ORAL | Status: DC | PRN

## 2021-08-01 MED ORDER — ONDANSETRON HCL 4 MG/2ML IJ SOLN: 4.0000 mg | Freq: Four times a day (QID) | INTRAMUSCULAR | Status: DC | PRN

## 2021-08-01 MED ORDER — HEPARIN (PORCINE) IN NACL 1000-0.9 UT/500ML-% IV SOLN
INTRAVENOUS | Status: DC | PRN
  Administered 2021-08-01: 500 mL

## 2021-08-01 MED ORDER — VANCOMYCIN HCL IN DEXTROSE 1-5 GM/200ML-% IV SOLN
1000.0000 mg | INTRAVENOUS | Status: AC
Start: 2021-08-01 — End: 2021-08-01
  Administered 2021-08-01: 1000 mg via INTRAVENOUS

## 2021-08-01 MED ORDER — IOHEXOL 350 MG/ML SOLN
INTRAVENOUS | Status: DC | PRN
  Administered 2021-08-01: 10 mL

## 2021-08-01 MED ORDER — SODIUM CHLORIDE 0.9 % IV SOLN
INTRAVENOUS | Status: AC
  Filled 2021-08-01: qty 2

## 2021-08-01 MED ORDER — SODIUM CHLORIDE 0.9 % IV SOLN: INTRAVENOUS | Status: DC

## 2021-08-01 MED ORDER — POVIDONE-IODINE 10 % EX SWAB: 2.0000 "application " | Freq: Once | CUTANEOUS | Status: DC

## 2021-08-01 MED ORDER — VANCOMYCIN HCL IN DEXTROSE 1-5 GM/200ML-% IV SOLN
INTRAVENOUS | Status: AC
  Filled 2021-08-01: qty 200

## 2021-08-01 MED ORDER — MIDAZOLAM HCL 5 MG/5ML IJ SOLN
INTRAMUSCULAR | Status: DC | PRN
  Administered 2021-08-01: 2 mg via INTRAVENOUS
  Administered 2021-08-01 (×2): 1 mg via INTRAVENOUS

## 2021-08-01 MED ORDER — LIDOCAINE HCL (PF) 1 % IJ SOLN
INTRAMUSCULAR | Status: DC | PRN
  Administered 2021-08-01: 60 mL

## 2021-08-01 SURGICAL SUPPLY — 9 items
CABLE SURGICAL S-101-97-12 (CABLE) ×2 IMPLANT
GUIDEWIRE ANGLED .035X150CM (WIRE) ×1 IMPLANT
ICD MOMENTUM D121 (ICD Generator) ×1 IMPLANT
LEAD INGEVITY 7841 52 (Lead) ×1 IMPLANT
LEAD RELIANCE 0138-64 (Lead) ×1 IMPLANT
PAD DEFIB RADIO PHYSIO CONN (PAD) ×2 IMPLANT
SHEATH 7FR PRELUDE SNAP 13 (SHEATH) ×1 IMPLANT
SHEATH 9FR PRELUDE SNAP 13 (SHEATH) ×1 IMPLANT
TRAY PACEMAKER INSERTION (PACKS) ×2 IMPLANT

## 2021-08-01 NOTE — H&P (Signed)
HPI [name] is referred by Dr. Aundra Dubin for consideration of ICD insertion. He has a history of ischemic heart disease and his chronic systolic heart failure status post bypass surgery.  He has undergone repeat echo postoperatively and found despite maximal medical therapy to have an ejection fraction of 30% by echo.  The patient does not have active angina.  He denies syncope.  Prior to revascularization, his ejection fraction was 15% by MRI.  Most recently by echo it is been 30%.  He has been under maximal guideline directed medical therapy.      Allergies  Allergen Reactions   Penicillins Other (See Comments)      childhood              Current Outpatient Medications  Medication Sig Dispense Refill   amiodarone (PACERONE) 200 MG tablet Take 200 mg (1 tablet) twice daily for 14 days, then decrease to 200 mg (1 tab) once daily 44 tablet 3   aspirin EC 81 MG EC tablet Take 1 tablet (81 mg total) by mouth daily. Swallow whole. 30 tablet 11   atorvastatin (LIPITOR) 80 MG tablet Take 1 tablet (80 mg total) by mouth daily. 90 tablet 3   carvedilol (COREG) 6.25 MG tablet Take 1 tablet (6.25 mg total) by mouth 2 (two) times daily with a meal. 180 tablet 3   dapagliflozin propanediol (FARXIGA) 10 MG TABS tablet Take 1 tablet (10 mg total) by mouth daily. 30 tablet 11   digoxin (LANOXIN) 0.125 MG tablet TAKE ONE TABLET BY MOUTH DAILY 30 tablet 3   ezetimibe (ZETIA) 10 MG tablet Take 1 tablet (10 mg total) by mouth daily. 90 tablet 3   furosemide (LASIX) 20 MG tablet Take 1 tablet (20 mg total) by mouth daily as needed for fluid or edema. 45 tablet 3   Multiple Vitamin (MULTIVITAMIN WITH MINERALS) TABS tablet Take 1 tablet by mouth daily.       sacubitril-valsartan (ENTRESTO) 24-26 MG Take 1 tablet by mouth 2 (two) times daily. 60 tablet 11   spironolactone (ALDACTONE) 25 MG tablet TAKE ONE TABLET BY MOUTH DAILY 30 tablet 3   warfarin (COUMADIN) 5 MG tablet TAKE 1 TO 2 TABLETS DAILY OR AS  DIRECTED BY THE COUMADIN CLINIC 50 tablet 0    No current facility-administered medications for this visit.            Past Medical History:  Diagnosis Date   Anemia     Asthma      childhood   Blood in stool     Childhood asthma     GI bleed     Kidney laceration 1999   Seizures ([place])        ROS:    All systems reviewed and negative except as noted in the HPI.          Past Surgical History:  Procedure Laterality Date   CORONARY ARTERY BYPASS GRAFT N/A 11/05/2020    Procedure: CORONARY ARTERY BYPASS GRAFTING (CABG);  Surgeon: Lajuana Matte, MD;  Location: [facility];  Service: Open Heart Surgery;  Laterality: N/A;  WITH POSSIBLE BALLOON PUMP   KIDNEY SURGERY        for laceration   RIGHT/LEFT HEART CATH AND CORONARY ANGIOGRAPHY N/A 10/29/2020    Procedure: RIGHT/LEFT HEART CATH AND CORONARY ANGIOGRAPHY;  Surgeon: Nigel Mormon, MD;  Location: [facility];  Service: Cardiovascular;  Laterality: N/A;   TEE WITHOUT CARDIOVERSION N/A  11/05/2020    Procedure: TRANSESOPHAGEAL ECHOCARDIOGRAM (TEE);  Surgeon: Lajuana Matte, MD;  Location: [facility];  Service: Open Heart Surgery;  Laterality: N/A;             Family History  Problem Relation Age of Onset   Cervical cancer Mother     Hypertension Father     Colon cancer Neg Hx     Pancreatic cancer Neg Hx     Esophageal cancer Neg Hx     Stomach cancer Neg Hx          Social History         Socioeconomic History   Marital status: Widowed      Spouse name: Not on file   Number of children: Not on file   Years of education: Not on file   Highest education level: Not on file  Occupational History   Occupation: truck driver  Tobacco Use   Smoking status: Former   Smokeless tobacco: Never   Tobacco comments:      quit in 90's  Substance and Sexual Activity   Alcohol use: Yes      Comment: 1 beer/month   Drug use: No   Sexual activity: Not on file  Other Topics Concern   Not on file  Social  History Narrative    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      Social Determinants of Health       Financial Resource Strain: Low Risk    Difficulty of Paying Living Expenses: Not very hard  Food Insecurity: No Food Insecurity   Worried About Charity fundraiser in the Last Year: Never true   Ran Out of Food in the Last Year: Never true  Transportation Needs: No Transportation Needs   Lack of Transportation (Medical): No   Lack of Transportation (Non-Medical): No  Physical Activity: Not on file  Stress: Not on file  Social Connections: Not on file  Intimate Partner Violence: Not on file        BP 100/60    Pulse (!) 54    Ht 6' (1.829 m)    Wt 158 lb (71.7 kg)    SpO2 99%    BMI 21.43 kg/m    Physical Exam:   Well appearing NAD HEENT: Unremarkable Neck:  No JVD, no thyromegally Lymphatics:  No adenopathy Back:  No CVA tenderness Lungs:  Clear, with no wheezes, rales, or rhonchi HEART:  Regular rate rhythm, no murmurs, no rubs, no clicks Abd:  soft, positive bowel sounds, no organomegally, no rebound, no guarding Ext:  2 plus pulses, no edema, no cyanosis, no clubbing Skin:  No rashes no nodules Neuro:  CN II through XII intact, motor grossly intact   EKG -sinus bradycardia with prior anterior myocardial infarction   Assess/Plan:  1.  Ischemic cardiomyopathy -he is status post bypass surgery with revascularization.  He denies anginal symptoms. 2.  Chronic systolic heart failure -the patient's heart failure symptoms are class II and he is euvolemic.  He is on maximal medical therapy under the direction of the heart failure clinic.  I have discussed the treatment options in detail with the patient.  The risk, goals, benefits, and expectations of ICD insertion were reviewed in detail.  He will call us if he would like to proceed with ICD insertion. 3.  Dyslipidemia -he will continue his statin therapy.  He will maintain a low-fat diet.  His LDL was at goal on lipid panel back in  JulyCristopher Peru, MD

## 2021-08-01 NOTE — Discharge Instructions (Addendum)
° ° °  Supplemental Discharge Instructions for  Pacemaker/Defibrillator Patients  Tomorrow, 08/02/21, send in a device transmission  Activity No heavy lifting or vigorous activity with your left/right arm for 6 to 8 weeks.  Do not raise your left/right arm above your head for one week.  Gradually raise your affected arm as drawn below.             08/06/21                     08/07/21                      08/08/21                   08/09/21 __  NO DRIVING for  1 week ; you may begin driving on  09/24/49   .  WOUND CARE Keep the wound area clean and dry.  Do not get this area wet , no showers for one week; you may shower on 08/09/21  . Tomorrow, 08/02/21, remove the arm sling Tomorrow, 08/02/21 remove the LARGE outer plastic bandage.  Underneath the plastic bandage there are steri strips (paper tapes), DO NOT remove these. The tape/steri-strips on your wound will fall off; do not pull them off.  No bandage is needed on the site.  DO  NOT apply any creams, oils, or ointments to the wound area. If you notice any drainage or discharge from the wound, any swelling or bruising at the site, or you develop a fever > 101? F after you are discharged home, call the office at once.  Special Instructions You are still able to use cellular telephones; use the ear opposite the side where you have your pacemaker/defibrillator.  Avoid carrying your cellular phone near your device. When traveling through airports, show security personnel your identification card to avoid being screened in the metal detectors.  Ask the security personnel to use the hand wand. Avoid arc welding equipment, MRI testing (magnetic resonance imaging), TENS units (transcutaneous nerve stimulators).  Call the office for questions about other devices. Avoid electrical appliances that are in poor condition or are not properly grounded. Microwave ovens are safe to be near or to operate.  Additional information for defibrillator patients should your  device go off: If your device goes off ONCE and you feel fine afterward, notify the device clinic nurses. If your device goes off ONCE and you do not feel well afterward, call 911. If your device goes off TWICE, call 911. If your device goes off THREE times in one day, call 911.  DO NOT DRIVE YOURSELF OR A FAMILY MEMBER WITH A DEFIBRILLATOR TO THE HOSPITAL--CALL 911.

## 2021-08-04 ENCOUNTER — Ambulatory Visit (INDEPENDENT_AMBULATORY_CARE_PROVIDER_SITE_OTHER): Payer: Federal, State, Local not specified - PPO

## 2021-08-04 ENCOUNTER — Encounter (HOSPITAL_COMMUNITY): Payer: Self-pay | Admitting: Internal Medicine

## 2021-08-04 ENCOUNTER — Telehealth: Payer: Self-pay

## 2021-08-04 ENCOUNTER — Other Ambulatory Visit: Payer: Self-pay

## 2021-08-04 DIAGNOSIS — Z5181 Encounter for therapeutic drug level monitoring: Secondary | ICD-10-CM | POA: Diagnosis not present

## 2021-08-04 DIAGNOSIS — I513 Intracardiac thrombosis, not elsewhere classified: Secondary | ICD-10-CM

## 2021-08-04 LAB — POCT INR: INR: 1.2 — AB (ref 2.0–3.0)

## 2021-08-04 NOTE — Patient Instructions (Signed)
Description   Take 1 tablet today and tomorrow, then resume same dosage of Warfarin 1/2 tablet daily except for 1 tablet on FRIDAYS. Continue leafy veggies two days a week only. Recheck INR in 1 week. Coumadin Clinic [phone] Main Number [phone]

## 2021-08-04 NOTE — Telephone Encounter (Signed)
-----   Message from [place], [place] sent at 08/01/2021  3:25 PM EST ----- Same day d/c today  BSci ICD GT

## 2021-08-04 NOTE — Telephone Encounter (Signed)
Follow-up after same day discharge: Implant date: 08/01/2021 MD: Cristopher Peru, MD Device: [place] D121 Momentum EL ICD Location: Left Chest   Wound check visit: 08/14/2021 @ 8:40 AM 90 day MD follow-up: 11/03/2021 @ 8:30 AM  Remote Transmission received:Yes  Dressing removed: Yes Sling Removed: Yes

## 2021-08-13 ENCOUNTER — Other Ambulatory Visit (HOSPITAL_COMMUNITY): Payer: Self-pay | Admitting: Cardiology

## 2021-08-14 ENCOUNTER — Ambulatory Visit: Payer: Federal, State, Local not specified - PPO

## 2021-08-14 ENCOUNTER — Other Ambulatory Visit: Payer: Self-pay

## 2021-08-14 ENCOUNTER — Ambulatory Visit (INDEPENDENT_AMBULATORY_CARE_PROVIDER_SITE_OTHER): Payer: Federal, State, Local not specified - PPO

## 2021-08-14 DIAGNOSIS — I255 Ischemic cardiomyopathy: Secondary | ICD-10-CM

## 2021-08-14 DIAGNOSIS — Z5181 Encounter for therapeutic drug level monitoring: Secondary | ICD-10-CM | POA: Diagnosis not present

## 2021-08-14 LAB — CUP PACEART INCLINIC DEVICE CHECK
Brady Statistic RA Percent Paced: 1 % — CL
Brady Statistic RV Percent Paced: 1 % — CL
Date Time Interrogation Session: 20230216102304
HighPow Impedance: 71 Ohm
Implantable Lead Implant Date: 20230203
Implantable Lead Implant Date: 20230203
Implantable Lead Location: 753859
Implantable Lead Location: 753860
Implantable Lead Model: 138
Implantable Lead Model: 7841
Implantable Lead Serial Number: 1237694
Implantable Lead Serial Number: 302781
Implantable Pulse Generator Implant Date: 20230203
Lead Channel Impedance Value: 506 Ohm
Lead Channel Impedance Value: 725 Ohm
Lead Channel Pacing Threshold Amplitude: 0.8 V
Lead Channel Pacing Threshold Amplitude: 0.9 V
Lead Channel Pacing Threshold Pulse Width: 0.4 ms
Lead Channel Pacing Threshold Pulse Width: 0.4 ms
Lead Channel Sensing Intrinsic Amplitude: 22.5 mV
Lead Channel Sensing Intrinsic Amplitude: 6.3 mV
Lead Channel Setting Pacing Amplitude: 3.5 V
Lead Channel Setting Pacing Amplitude: 3.5 V
Lead Channel Setting Pacing Pulse Width: 0.4 ms
Lead Channel Setting Sensing Sensitivity: 0.5 mV
Pulse Gen Serial Number: 233963

## 2021-08-14 LAB — POCT INR: INR: 2 (ref 2.0–3.0)

## 2021-08-14 NOTE — Patient Instructions (Signed)
   After Your ICD (Implantable Cardiac Defibrillator)    Monitor your defibrillator site for redness, swelling, and drainage. Call the device clinic at [phone] if you experience these symptoms or fever/chills.  Your incision was closed with Steri-strips or staples:  You may shower 7 days after your procedure and wash your incision with soap and water. Avoid lotions, ointments, or perfumes over your incision until it is well-healed.    You may use a hot tub or a pool after your wound check appointment if the incision is completely closed.  Do not lift, push or pull greater than 10 pounds with the affected arm until 6 weeks after your procedure. There are no other restrictions in arm movement after your wound check appointment.  Your ICD is MRI compatible.  Your ICD is designed to protect you from life threatening heart rhythms. Because of this, you may receive a shock.   1 shock with no symptoms:  Call the office during business hours. 1 shock with symptoms (chest pain, chest pressure, dizziness, lightheadedness, shortness of breath, overall feeling unwell):  Call 911. If you experience 2 or more shocks in 24 hours:  Call 911. If you receive a shock, you should not drive.  [place] DMV - no driving for 6 months if you receive appropriate therapy from your ICD.   ICD Alerts:  Some alerts are vibratory and others beep. These are NOT emergencies. Please call our office to let us know. If this occurs at night or on weekends, it can wait until the next business day. Send a remote transmission.  If your device is capable of reading fluid status (for heart failure), you will be offered monthly monitoring to review this with you.   Remote monitoring is used to monitor your ICD from home. This monitoring is scheduled every 91 days by our office. It allows us to keep an eye on the functioning of your device to ensure it is working properly. You will routinely see your Electrophysiologist annually  (more often if necessary).   

## 2021-08-14 NOTE — Patient Instructions (Signed)
Description   Take 1 tablet today and then continue taking Warfarin 1/2 tablet daily except for 1 tablet on FRIDAYS. Continue leafy veggies two days a week only. Recheck INR in 3 weeks. Coumadin Clinic [phone] Main Number [phone]

## 2021-08-14 NOTE — Progress Notes (Signed)
Wound check appointment. Steri-strips removed. Wound without redness or edema. Incision edges approximated, wound well healed. Normal device function. Thresholds, sensing, and impedances consistent with implant measurements. Device programmed at 3.5V for extra safety margin until 3 month visit. Histogram distribution appropriate for patient and level of activity. No mode switches or ventricular arrhythmias noted. Patient educated about wound care, arm mobility, lifting restrictions, shock plan. Patient is enrolled in remote monitoring, next scheduled check 11/03/21.  ROV with Dr. Ladona Ridgel on 11/03/21.

## 2021-09-01 ENCOUNTER — Telehealth: Payer: Self-pay

## 2021-09-01 ENCOUNTER — Encounter (HOSPITAL_COMMUNITY): Payer: Self-pay

## 2021-09-01 ENCOUNTER — Other Ambulatory Visit: Payer: Self-pay

## 2021-09-01 ENCOUNTER — Ambulatory Visit (HOSPITAL_COMMUNITY)
Admission: RE | Admit: 2021-09-01 | Discharge: 2021-09-01 | Disposition: A | Discharge status: Home / Self Care | Payer: Federal, State, Local not specified - PPO | Source: Ambulatory Visit | Attending: Family Medicine | Admitting: Family Medicine

## 2021-09-01 VITALS — BP 88/54 | HR 65 | Wt 159.0 lb

## 2021-09-01 DIAGNOSIS — I252 Old myocardial infarction: Secondary | ICD-10-CM | POA: Insufficient documentation

## 2021-09-01 DIAGNOSIS — E782 Mixed hyperlipidemia: Secondary | ICD-10-CM

## 2021-09-01 DIAGNOSIS — Z87891 Personal history of nicotine dependence: Secondary | ICD-10-CM | POA: Insufficient documentation

## 2021-09-01 DIAGNOSIS — R42 Dizziness and giddiness: Secondary | ICD-10-CM | POA: Insufficient documentation

## 2021-09-01 DIAGNOSIS — Z7901 Long term (current) use of anticoagulants: Secondary | ICD-10-CM | POA: Insufficient documentation

## 2021-09-01 DIAGNOSIS — I493 Ventricular premature depolarization: Secondary | ICD-10-CM | POA: Diagnosis not present

## 2021-09-01 DIAGNOSIS — Z951 Presence of aortocoronary bypass graft: Secondary | ICD-10-CM | POA: Insufficient documentation

## 2021-09-01 DIAGNOSIS — Z7984 Long term (current) use of oral hypoglycemic drugs: Secondary | ICD-10-CM | POA: Insufficient documentation

## 2021-09-01 DIAGNOSIS — Z86718 Personal history of other venous thrombosis and embolism: Secondary | ICD-10-CM | POA: Insufficient documentation

## 2021-09-01 DIAGNOSIS — E785 Hyperlipidemia, unspecified: Secondary | ICD-10-CM | POA: Insufficient documentation

## 2021-09-01 DIAGNOSIS — I255 Ischemic cardiomyopathy: Secondary | ICD-10-CM | POA: Diagnosis not present

## 2021-09-01 DIAGNOSIS — I5022 Chronic systolic (congestive) heart failure: Secondary | ICD-10-CM | POA: Insufficient documentation

## 2021-09-01 DIAGNOSIS — Z79899 Other long term (current) drug therapy: Secondary | ICD-10-CM | POA: Insufficient documentation

## 2021-09-01 DIAGNOSIS — Z09 Encounter for follow-up examination after completed treatment for conditions other than malignant neoplasm: Secondary | ICD-10-CM | POA: Insufficient documentation

## 2021-09-01 DIAGNOSIS — I2510 Atherosclerotic heart disease of native coronary artery without angina pectoris: Secondary | ICD-10-CM | POA: Insufficient documentation

## 2021-09-01 DIAGNOSIS — I513 Intracardiac thrombosis, not elsewhere classified: Secondary | ICD-10-CM | POA: Diagnosis not present

## 2021-09-01 LAB — BASIC METABOLIC PANEL
Anion gap: 6 (ref 5–15)
BUN: 26 mg/dL — ABNORMAL HIGH (ref 8–23)
CO2: 27 mmol/L (ref 22–32)
Calcium: 9.5 mg/dL (ref 8.9–10.3)
Chloride: 105 mmol/L (ref 98–111)
Creatinine, Ser: 1.71 mg/dL — ABNORMAL HIGH (ref 0.61–1.24)
GFR, Estimated: 43 mL/min — ABNORMAL LOW (ref >60)
Glucose, Bld: 125 mg/dL — ABNORMAL HIGH (ref 70–99)
Potassium: 4.5 mmol/L (ref 3.5–5.1)
Sodium: 138 mmol/L (ref 135–145)

## 2021-09-01 LAB — DIGOXIN LEVEL: Digoxin Level: 0.6 ng/mL — ABNORMAL LOW (ref 0.8–2.0)

## 2021-09-01 NOTE — Progress Notes (Signed)
?Advanced Heart Failure Clinic Note  ? ?PCP: Ephriam Jenkins, PA (Inactive) ?Cardiology: Dr. Aundra Dubin  ?CT Surgeon: Dr. Kipp Brood  ? ?Reason for Visit: F/u for Systolic Heart Failure 2/2 ICM s/p CABG  ? ?HPI: ? ?71 y.o. AAM w/ h/o anemia, ?prior seizure, former smoker (quit 71 y/o), HLD and newly diagnosed ischemic heart disease/ systolic heart failure, and s/p CABG x 4.  ? ?Admitted to [facility] 5/22 for acute CHF and ruled in for NSTEMi.  Diuresed w/ IV lasix and placed on IV heparin. Echo showed severely reduced LVEF <20% w/ global HK, GIIDD and small LV apical thrombus, moderate MR and RV mildly reduced. R/LHC showed severe MVCAD (angiographic details below) and well compensated hemodynamics w/ low/normal filling pressures and preserved CO. RAP 1 mmHg, PCW 6 mmHg, CO 4.9 L/min, CI 2.5 L/min2. cMRI suggested extensive viability (except for apical septal wall and true apex) + small LV thrombus.  ? ?Underwent CABG x 4 by Dr. Kipp Brood (LIMA-LAD, SVG-D2, SVG-OM1, SVG-PDA). Post-operative course uncomplicated. GDMT initiated prior to d/c. Also of note, LDL was markedly elevated at 204 mg/dL. Was placed on high intensity statin &  Coumadin for LV thrombus. ? ?Echo in 10/22 showed EF 30-35%, mildly decreased RV systolic function.  ? ?Zio patch in 10/22 showed 20% PVCs with short NSVT runs, amiodarone was started.  ? ?Zio patch (3-day) 12/22 showed rare PVCs. ? ?S/p [employer] ICD 2/23. ? ?Today he returns for HF follow up. Overall feeling fine. He is not SOB with stairs or walking on flat ground. Occasional dizziness with bending over, no falls. Denies abnormal bleeding, palpitations, CP, dizziness, edema, or PND/Orthopnea. Appetite ok. No fever or chills. He does not weigh at home. Taking all medications. Working full time. He has not needed Lasix. ? ?Labs (7/22): K 4.3, creatinine 1.23, LDL 49, TGs 60, digoxin 0.7 ?Labs (10/22): digoxin 0.8, K 4.4, creatinine 1.09 ?Labs (1/23): K 4.1, creatinine 1.44, LDL 41 ? ?PMH: ?1. LV  thrombus.  ?2. CAD: NSTEMI in 5/22 with severe 3 vessel disease on cath.  ?- CABG (5/22) with LIMA-LAD, SVG-D2, SVG-OM1, SVG-PDA.   ?3. Hyperlipidemia.  ?4. Chronic systolic CHF: Ischemic cardiomyopathy.  ?- Echo (5/22): EF <20%, LV severely decreased, Grade II DD, small LV apical thrombus, RV mildly reduced. ?- cMRI (5/22): Severe LV enlargement with severe global hypokinesis with regional variation. EF 14%, small apical LV thrombus, normal RV size with mildly decreased systolic function, EF [id number], moderate MR, Based on severe LV dysfunction, there was less late gadolinium enhancement than I would have expected. With the exception of the apical septal wall and the true apex, would expect the remainder of the LV myocardium to be viable. ?- Echo (10/22): EF 30-35%, global hypokinesis, mildly decreased RV systolic function, IVC normal, no LV thrombus. ?- s/p [employer]i ICD 1/23 ?5. PVCs: Zio monitor (10/22) with 10% PVCs.  ?- Zio (12/22): rare PVCs ? ?Review of systems complete and found to be negative unless listed in HPI.    ? ?Current Outpatient Medications  ?Medication Sig Dispense Refill  ? amiodarone (PACERONE) 200 MG tablet Take 200 mg by mouth daily.    ? aspirin EC 81 MG EC tablet Take 1 tablet (81 mg total) by mouth daily. Swallow whole. 30 tablet 11  ? atorvastatin (LIPITOR) 80 MG tablet Take 1 tablet (80 mg total) by mouth daily. 90 tablet 3  ? carvedilol (COREG) 6.25 MG tablet Take 1 tablet (6.25 mg total) by mouth 2 (two) times daily with a  meal. 180 tablet 3  ? dapagliflozin propanediol (FARXIGA) 10 MG TABS tablet Take 1 tablet (10 mg total) by mouth daily. 30 tablet 11  ? digoxin (LANOXIN) 0.125 MG tablet TAKE 1 TABLET BY MOUTH DAILY 30 tablet 3  ? ezetimibe (ZETIA) 10 MG tablet Take 1 tablet (10 mg total) by mouth daily. 90 tablet 3  ? furosemide (LASIX) 20 MG tablet TAKE 1 TABLET (20 MG TOTAL) BY MOUTH DAILY AS NEEDED FOR FLUID OR EDEMA. 45 tablet 3  ? Multiple Vitamin (MULTIVITAMIN WITH MINERALS) TABS  tablet Take 1 tablet by mouth daily.    ? sacubitril-valsartan (ENTRESTO) 24-26 MG Take 1 tablet by mouth 2 (two) times daily. 60 tablet 11  ? spironolactone (ALDACTONE) 25 MG tablet TAKE 1 TABLET BY MOUTH DAILY 30 tablet 3  ? warfarin (COUMADIN) 5 MG tablet Take 1/2 to 1 tablet once daily as directed by the coumadin clinic (Patient taking differently: Take 1/2 to 1 tablet once daily as directed by the coumadin clinic and takes 1 tablet on Fridays.) 30 tablet 3  ? ?No current facility-administered medications for this encounter.  ? ?Allergies  ?Allergen Reactions  ? Penicillins Other (See Comments)  ?  childhood  ? ?Social History  ? ?Socioeconomic History  ? Marital status: Widowed  ?  Spouse name: Not on file  ? Number of children: Not on file  ? Years of education: Not on file  ? Highest education level: Not on file  ?Occupational History  ? Occupation: truck Geophysicist/field seismologist  ?Tobacco Use  ? Smoking status: Former  ? Smokeless tobacco: Never  ? Tobacco comments:  ?  quit in 90's  ?Substance and Sexual Activity  ? Alcohol use: Yes  ?  Comment: 1 beer/month  ? Drug use: No  ? Sexual activity: Not on file  ?Other Topics Concern  ? Not on file  ?Social History Narrative  ? 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    ? ?Social Determinants of Health  ? ?Financial Resource Strain: Low Risk   ? Difficulty of Paying Living Expenses: Not very hard  ?Food Insecurity: No Food Insecurity  ? Worried About Charity fundraiser in the Last Year: Never true  ? Ran Out of Food in the Last Year: Never true  ?Transportation Needs: No Transportation Needs  ? Lack of Transportation (Medical): No  ? Lack of Transportation (Non-Medical): No  ?Physical Activity: Not on file  ?Stress: Not on file  ?Social Connections: Not on file  ?Intimate Partner Violence: Not on file  ? ?Family History  ?Problem Relation Age of Onset  ? Cervical cancer Mother   ? Hypertension Father   ? Colon cancer Neg Hx   ? Pancreatic cancer Neg Hx   ? Esophageal cancer Neg Hx   ?  Stomach cancer Neg Hx   ? ?BP (!) 88/54   Pulse 65   Wt 72.1 kg (159 lb)   SpO2 99%   BMI 20.98 kg/m?  ? ?Wt Readings from Last 3 Encounters:  ?09/01/21 72.1 kg (159 lb)  ?08/01/21 68 kg (150 lb)  ?06/05/21 70.5 kg (155 lb 6.4 oz)  ? ?PHYSICAL EXAM: ?General:  NAD. No resp difficulty ?HEENT: Normal ?Neck: Supple. No JVD. Carotids 2+ bilat; no bruits. No lymphadenopathy or thryomegaly appreciated. ?Cor: PMI nondisplaced. Regular rate & rhythm. No rubs, gallops or murmurs. ?Lungs: Clear ?Abdomen: Soft, nontender, nondistended. No hepatosplenomegaly. No bruits or masses. Good bowel sounds. ?Extremities: No cyanosis, clubbing, rash, edema ?Neuro: Alert & oriented  x 3, cranial nerves grossly intact. Moves all 4 extremities w/o difficulty. Affect pleasant. ? ?ASSESSMENT & PLAN: ?1. CAD: NSTEMI (5/22). Cath w/ severe MVCAD. S/p LIMA-LAD, SVG-D2, SVG-OM1, SVG-PDA.  No chest pain.  ?- Continue ASA 81.  ?- Continue atorvastatin 80 mg daily. Good lipids 1/23. ?2. Chronic Systolic Heart Failure: Ischemic CMP. EF < 20% on echo in 5/22. cMRI suggested extensive viability. Now s/p CABG. Echo in 10/22 showed EF 30-35%.  High percentage of PVCs may have contributed to persistent low EF. S/p [employer] ICD 1/23. NYHA Class II. Volume status stable. ?- Continue Lasix 20 mg prn, has not had to use.  ?- Continue Entresto 24-26 mg bid. No BP room to increase. ?- Continue Farxiga 10 mg daily.  ?- Continue spiro 25 mg daily. BMET today. ?- Continue digoxin 0.0625 mg daily. Check digoxin level today. ?- Continue Coreg 6.25 mg bid.   ?- Continue amiodarone to suppress PVCs.  ?3. HLD: LDL 204 mg/dL on Lipid panel (5/22). LDL goal < 70 mg/dL.  ?- Continue atorvastatin and Zetia, good lipids 1/23. ?4. LV Thrombus: small thrombus noted on cMRI. ?- On Coumadin. Followed by [facility].  ?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 12/22 Zio showed rare PVCs. ?- Continue amiodarone, LFTs and TSH ok 1/23.  He will need  a regular eye exam.  ? ?Followup in 3 months with Dr. Aundra Dubin ?  ?Rafael Bihari, FNP ?09/01/21 ?

## 2021-09-01 NOTE — Patient Instructions (Signed)
Thank you for coming in today ? ?There were no medication changes today ? ?Labs were done today, if any labs are abnormal the clinic will call you ?No news is good news ? ?Your physician recommends that you schedule a follow-up appointment in:  3 months with Dr. Shirlee Latch ? ?At the [facility], you and your health needs are our priority. As part of our continuing mission to provide you with exceptional heart care, we have created designated Provider Care Teams. These Care Teams include your primary Cardiologist (physician) and Advanced Practice Providers (APPs- Physician Assistants and Nurse Practitioners) who all work together to provide you with the care you need, when you need it.  ? ?You may see any of the following providers on your designated Care Team at your next follow up: ?Dr Arvilla Meres ?Dr Marca Ancona ?Tonye Becket, NP ?Robbie Lis, PA ?Jessica Milford,NP ?Anna Genre, PA ?Karle Plumber, PharmD ? ? ?Please be sure to bring in all your medications bottles to every appointment.  ? ?If you have any questions or concerns before your next appointment please send [place] a message through [place] or call our office at [phone].   ? ?TO LEAVE A MESSAGE FOR THE NURSE SELECT OPTION 2, PLEASE LEAVE A MESSAGE INCLUDING: ?YOUR NAME ?DATE OF BIRTH ?CALL BACK NUMBER ?REASON FOR CALL**this is important as we prioritize the call backs ? ?YOU WILL RECEIVE A CALL BACK THE SAME DAY AS LONG AS YOU CALL BEFORE 4:00 PM ? ?

## 2021-09-05 ENCOUNTER — Ambulatory Visit: Payer: Federal, State, Local not specified - PPO

## 2021-09-05 ENCOUNTER — Other Ambulatory Visit: Payer: Self-pay

## 2021-09-05 DIAGNOSIS — Z5181 Encounter for therapeutic drug level monitoring: Secondary | ICD-10-CM | POA: Diagnosis not present

## 2021-09-05 DIAGNOSIS — I513 Intracardiac thrombosis, not elsewhere classified: Secondary | ICD-10-CM

## 2021-09-05 LAB — POCT INR: INR: 3.2 — AB (ref 2.0–3.0)

## 2021-09-05 NOTE — Patient Instructions (Signed)
Take 1/2 tablet today and then continue taking Warfarin 1/2 tablet daily except for 1 tablet on FRIDAYS. Continue leafy veggies two days a week only. Recheck INR in 4 weeks. Coumadin Clinic [phone] Main Number [phone]  ?

## 2021-09-16 ENCOUNTER — Other Ambulatory Visit (HOSPITAL_COMMUNITY): Payer: Self-pay | Admitting: Cardiology

## 2021-10-10 ENCOUNTER — Ambulatory Visit: Payer: Federal, State, Local not specified - PPO | Admitting: *Deleted

## 2021-10-10 DIAGNOSIS — Z5181 Encounter for therapeutic drug level monitoring: Secondary | ICD-10-CM

## 2021-10-10 LAB — POCT INR: INR: 3.9 — AB (ref 2.0–3.0)

## 2021-10-10 NOTE — Patient Instructions (Signed)
Description   ?Hold warfarin today, then start taking warfarin 1/2 a tablet daily.  Continue leafy veggies two days a week only. Recheck INR in 3 weeks. Coumadin Clinic [phone] Main Number [phone]  ?  ? ? ?

## 2021-10-14 ENCOUNTER — Other Ambulatory Visit (HOSPITAL_COMMUNITY): Payer: Self-pay | Admitting: Cardiology

## 2021-10-17 ENCOUNTER — Encounter: Payer: Federal, State, Local not specified - PPO | Admitting: Internal Medicine

## 2021-11-03 ENCOUNTER — Encounter: Payer: Self-pay | Admitting: Internal Medicine

## 2021-11-03 ENCOUNTER — Ambulatory Visit (INDEPENDENT_AMBULATORY_CARE_PROVIDER_SITE_OTHER): Payer: Federal, State, Local not specified - PPO

## 2021-11-03 ENCOUNTER — Ambulatory Visit: Payer: Federal, State, Local not specified - PPO | Admitting: Internal Medicine

## 2021-11-03 ENCOUNTER — Ambulatory Visit (INDEPENDENT_AMBULATORY_CARE_PROVIDER_SITE_OTHER): Payer: Federal, State, Local not specified - PPO | Admitting: *Deleted

## 2021-11-03 VITALS — BP 102/64 | HR 57 | Ht 73.0 in | Wt 162.0 lb

## 2021-11-03 DIAGNOSIS — I513 Intracardiac thrombosis, not elsewhere classified: Secondary | ICD-10-CM

## 2021-11-03 DIAGNOSIS — I255 Ischemic cardiomyopathy: Secondary | ICD-10-CM

## 2021-11-03 DIAGNOSIS — I5022 Chronic systolic (congestive) heart failure: Secondary | ICD-10-CM

## 2021-11-03 DIAGNOSIS — Z5181 Encounter for therapeutic drug level monitoring: Secondary | ICD-10-CM

## 2021-11-03 DIAGNOSIS — Z95810 Presence of automatic (implantable) cardiac defibrillator: Secondary | ICD-10-CM | POA: Diagnosis not present

## 2021-11-03 LAB — POCT INR: INR: 2.5 (ref 2.0–3.0)

## 2021-11-03 MED ORDER — AMIODARONE HCL 200 MG PO TABS: ORAL_TABLET | ORAL | 3 refills | Status: AC

## 2021-11-03 NOTE — Progress Notes (Signed)
? ? ? ? ?HPI ?Troy Johnson returns today for followup. He is a pleasant 71 yo man with a h/o ICM, s/p CABG, chronic systolic heart failure EF of 30%. He had been on GDMT and continued to have LV dysfunction and underwent ICD insertion about 3 months ago. In the interim he notes ?Allergies  ?Allergen Reactions  ? Penicillins Other (See Comments)  ?  childhood  ? ? ? ?Current Outpatient Medications  ?Medication Sig Dispense Refill  ? amiodarone (PACERONE) 200 MG tablet Take 1 tablet (200 mg total) by mouth daily. 90 tablet 3  ? aspirin EC 81 MG EC tablet Take 1 tablet (81 mg total) by mouth daily. Swallow whole. 30 tablet 11  ? atorvastatin (LIPITOR) 80 MG tablet Take 1 tablet (80 mg total) by mouth daily. 90 tablet 3  ? carvedilol (COREG) 6.25 MG tablet Take 1 tablet (6.25 mg total) by mouth 2 (two) times daily with a meal. 180 tablet 3  ? dapagliflozin propanediol (FARXIGA) 10 MG TABS tablet Take 1 tablet (10 mg total) by mouth daily. 30 tablet 11  ? digoxin (LANOXIN) 0.125 MG tablet TAKE 1 TABLET BY MOUTH DAILY 30 tablet 3  ? ezetimibe (ZETIA) 10 MG tablet TAKE 1 TABLET BY MOUTH EVERY DAY 90 tablet 3  ? furosemide (LASIX) 20 MG tablet TAKE 1 TABLET (20 MG TOTAL) BY MOUTH DAILY AS NEEDED FOR FLUID OR EDEMA. 45 tablet 3  ? Multiple Vitamin (MULTIVITAMIN WITH MINERALS) TABS tablet Take 1 tablet by mouth daily.    ? sacubitril-valsartan (ENTRESTO) 24-26 MG Take 1 tablet by mouth 2 (two) times daily. 60 tablet 11  ? spironolactone (ALDACTONE) 25 MG tablet TAKE 1 TABLET BY MOUTH DAILY 30 tablet 3  ? warfarin (COUMADIN) 5 MG tablet Take 1/2 to 1 tablet once daily as directed by the coumadin clinic (Patient taking differently: Take 1/2 to 1 tablet once daily as directed by the coumadin clinic and takes 1 tablet on Fridays.) 30 tablet 3  ? ?No current facility-administered medications for this visit.  ? ? ? ?Past Medical History:  ?Diagnosis Date  ? Anemia   ? Asthma   ? childhood  ? Blood in stool   ? Childhood asthma   ?  GI bleed   ? Kidney laceration 1999  ? Seizures ([place])   ? ? ?ROS: ? ? All systems reviewed and negative except as noted in the HPI. ? ? ?Past Surgical History:  ?Procedure Laterality Date  ? CORONARY ARTERY BYPASS GRAFT N/A 11/05/2020  ? Procedure: CORONARY ARTERY BYPASS GRAFTING (CABG);  Surgeon: Lajuana Matte, MD;  Location: [place];  Service: Open Heart Surgery;  Laterality: N/A;  WITH POSSIBLE BALLOON PUMP  ? ICD IMPLANT N/A 08/01/2021  ? Procedure: ICD IMPLANT;  Surgeon: Evans Lance, MD;  Location: [facility];  Service: Cardiovascular;  Laterality: N/A;  ? KIDNEY SURGERY    ? for laceration  ? RIGHT/LEFT HEART CATH AND CORONARY ANGIOGRAPHY N/A 10/29/2020  ? Procedure: RIGHT/LEFT HEART CATH AND CORONARY ANGIOGRAPHY;  Surgeon: Nigel Mormon, MD;  Location: [facility];  Service: Cardiovascular;  Laterality: N/A;  ? TEE WITHOUT CARDIOVERSION N/A 11/05/2020  ? Procedure: TRANSESOPHAGEAL ECHOCARDIOGRAM (TEE);  Surgeon: Lajuana Matte, MD;  Location: [facility];  Service: Open Heart Surgery;  Laterality: N/A;  ? ? ? ?Family History  ?Problem Relation Age of Onset  ? Cervical cancer Mother   ? Hypertension Father   ? Colon cancer Neg Hx   ?  Pancreatic cancer Neg Hx   ? Esophageal cancer Neg Hx   ? Stomach cancer Neg Hx   ? ? ? ?Social History  ? ?Socioeconomic History  ? Marital status: Widowed  ?  Spouse name: Not on file  ? Number of children: Not on file  ? Years of education: Not on file  ? Highest education level: Not on file  ?Occupational History  ? Occupation: truck Geophysicist/field seismologist  ?Tobacco Use  ? Smoking status: Former  ? Smokeless tobacco: Never  ? Tobacco comments:  ?  quit in 90's  ?Substance and Sexual Activity  ? Alcohol use: Yes  ?  Comment: 1 beer/month  ? Drug use: No  ? Sexual activity: Not on file  ?Other Topics Concern  ? Not on file  ?Social History Narrative  ? 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    ? ?Social Determinants of Health  ? ?Financial Resource Strain: Not on file   ?Food Insecurity: Not on file  ?Transportation Needs: Not on file  ?Physical Activity: Not on file  ?Stress: Not on file  ?Social Connections: Not on file  ?Intimate Partner Violence: Not on file  ? ? ? ?There were no vitals taken for this visit. ? ?Physical Exam: ? ?Well appearing NAD ?HEENT: Unremarkable ?Neck:  No JVD, no thyromegally ?Lymphatics:  No adenopathy ?Back:  No CVA tenderness ?Lungs:  Clear ?HEART:  Regular rate rhythm, no murmurs, no rubs, no clicks ?Abd:  soft, positive bowel sounds, no organomegally, no rebound, no guarding ?Ext:  2 plus pulses, no edema, no cyanosis, no clubbing ?Skin:  No rashes no nodules ?Neuro:  CN II through XII intact, motor grossly intact ? ?EKG ? ?DEVICE  ?Normal device function.  See PaceArt for details.  ? ?Assess/Plan:  ?1.  Ischemic cardiomyopathy -he is status post bypass surgery with revascularization.  He denies anginal symptoms. ?2.  Chronic systolic heart failure -the patient's heart failure symptoms are class II and he is euvolemic.  He is on maximal medical therapy under the direction of the heart failure clinic.  ?3.  Dyslipidemia -he will continue his statin therapy.  He will maintain a low-fat diet.  His LDL was at goal on lipid panel back in July. ?4. PVC's - he has been on amiodarone. I asked him to reduce his dose to 200 mg daily, none on Sunday. ?4. ICD - his [employer] DDD ICD is working normally. We will recheck in several months.  ?  ?Cristopher Peru, MD ?

## 2021-11-03 NOTE — Patient Instructions (Addendum)
Medication Instructions:  ?Your physician has recommended you make the following change in your medication:  ? ? REDUCE your amiodarone 200 mg-  Take one tablet by mouth daily Monday through Saturday.  Do NOT take on Sunday. ? ? ?Labwork: ?None ordered. ? ?Testing/Procedures: ?None ordered. ? ?Follow-Up: ?Your physician wants you to follow-up in: one year with Cristopher Peru, MD or one of the following Advanced Practice Providers on your designated Care Team:   ?Tommye Standard, PA-C ?Legrand Como "Jonni Sanger" Potlatch, PA-C ? ?Remote monitoring is used to monitor your ICD from home. This monitoring reduces the number of office visits required to check your device to one time per year. It allows [place] to keep an eye on the functioning of your device to ensure it is working properly. You are scheduled for a device check from home on 02/02/2022. You may send your transmission at any time that day. If you have a wireless device, the transmission will be sent automatically. After your physician reviews your transmission, you will receive a postcard with your next transmission date. ? ?Any Other Special Instructions Will Be Listed Below (If Applicable). ? ?If you need a refill on your cardiac medications before your next appointment, please call your pharmacy.  ? ?Important Information About Sugar ? ? ? ? ? ? ? ?

## 2021-11-03 NOTE — Patient Instructions (Addendum)
Description   ?Continue taking warfarin 1/2 tablet daily.  Continue leafy veggies two days a week only. Recheck INR in 4 weeks. [facility] [phone] Main Number [phone]  ?5/8-AMIO DECREASED-TAKING 200MG  QD EXCEPT NONE ON Sunday. ? ?NEED 2 COPIES ?  ?  ?  ?

## 2021-11-04 LAB — CUP PACEART REMOTE DEVICE CHECK
Battery Remaining Longevity: 156 mo
Battery Remaining Percentage: 100 %
Brady Statistic RA Percent Paced: 2 %
Brady Statistic RV Percent Paced: 0 %
Date Time Interrogation Session: 20230508002100
HighPow Impedance: 67 Ohm
Implantable Lead Implant Date: 20230203
Implantable Lead Implant Date: 20230203
Implantable Lead Location: 753859
Implantable Lead Location: 753860
Implantable Lead Model: 138
Implantable Lead Model: 7841
Implantable Lead Serial Number: 1237694
Implantable Lead Serial Number: 302781
Implantable Pulse Generator Implant Date: 20230203
Lead Channel Impedance Value: 530 Ohm
Lead Channel Impedance Value: 708 Ohm
Lead Channel Pacing Threshold Amplitude: 0.5 V
Lead Channel Pacing Threshold Amplitude: 0.6 V
Lead Channel Pacing Threshold Pulse Width: 0.4 ms
Lead Channel Pacing Threshold Pulse Width: 0.4 ms
Lead Channel Setting Pacing Amplitude: 3.5 V
Lead Channel Setting Pacing Amplitude: 3.5 V
Lead Channel Setting Pacing Pulse Width: 0.4 ms
Lead Channel Setting Sensing Sensitivity: 0.5 mV
Pulse Gen Serial Number: 233963

## 2021-11-10 ENCOUNTER — Telehealth (HOSPITAL_COMMUNITY): Payer: Self-pay | Admitting: Pharmacy Technician

## 2021-11-10 ENCOUNTER — Other Ambulatory Visit (HOSPITAL_COMMUNITY): Payer: Self-pay

## 2021-11-10 NOTE — Telephone Encounter (Signed)
Advanced Heart Failure Patient Advocate Encounter ? ?Prior Authorization for Troy Johnson has been submitted and approved.   ? ?Effective dates: 10/11/21 through 11/10/22 ? ?Archer Asa, CPhT ? ?

## 2021-11-18 NOTE — Progress Notes (Signed)
Remote ICD transmission.   

## 2021-12-01 ENCOUNTER — Ambulatory Visit (HOSPITAL_COMMUNITY)
Admission: RE | Admit: 2021-12-01 | Discharge: 2021-12-01 | Disposition: A | Discharge status: Home / Self Care | Payer: Federal, State, Local not specified - PPO | Source: Ambulatory Visit | Attending: Cardiology | Admitting: Cardiology

## 2021-12-01 ENCOUNTER — Ambulatory Visit: Payer: Federal, State, Local not specified - PPO

## 2021-12-01 ENCOUNTER — Encounter (HOSPITAL_COMMUNITY): Payer: Self-pay | Admitting: Cardiology

## 2021-12-01 VITALS — BP 110/60 | HR 56 | Wt 162.4 lb

## 2021-12-01 DIAGNOSIS — E785 Hyperlipidemia, unspecified: Secondary | ICD-10-CM | POA: Diagnosis not present

## 2021-12-01 DIAGNOSIS — I513 Intracardiac thrombosis, not elsewhere classified: Secondary | ICD-10-CM

## 2021-12-01 DIAGNOSIS — I255 Ischemic cardiomyopathy: Secondary | ICD-10-CM | POA: Diagnosis not present

## 2021-12-01 DIAGNOSIS — Z7901 Long term (current) use of anticoagulants: Secondary | ICD-10-CM | POA: Diagnosis not present

## 2021-12-01 DIAGNOSIS — I252 Old myocardial infarction: Secondary | ICD-10-CM | POA: Insufficient documentation

## 2021-12-01 DIAGNOSIS — Z79899 Other long term (current) drug therapy: Secondary | ICD-10-CM | POA: Diagnosis not present

## 2021-12-01 DIAGNOSIS — I5022 Chronic systolic (congestive) heart failure: Secondary | ICD-10-CM | POA: Diagnosis not present

## 2021-12-01 DIAGNOSIS — I493 Ventricular premature depolarization: Secondary | ICD-10-CM | POA: Insufficient documentation

## 2021-12-01 DIAGNOSIS — Z5181 Encounter for therapeutic drug level monitoring: Secondary | ICD-10-CM

## 2021-12-01 DIAGNOSIS — Z87891 Personal history of nicotine dependence: Secondary | ICD-10-CM | POA: Diagnosis not present

## 2021-12-01 DIAGNOSIS — Z951 Presence of aortocoronary bypass graft: Secondary | ICD-10-CM | POA: Diagnosis not present

## 2021-12-01 DIAGNOSIS — I2510 Atherosclerotic heart disease of native coronary artery without angina pectoris: Secondary | ICD-10-CM | POA: Diagnosis present

## 2021-12-01 LAB — COMPREHENSIVE METABOLIC PANEL
ALT: 39 U/L (ref 0–44)
AST: 28 U/L (ref 15–41)
Albumin: 3.7 g/dL (ref 3.5–5.0)
Alkaline Phosphatase: 47 U/L (ref 38–126)
Anion gap: 5 (ref 5–15)
BUN: 25 mg/dL — ABNORMAL HIGH (ref 8–23)
CO2: 25 mmol/L (ref 22–32)
Calcium: 9.2 mg/dL (ref 8.9–10.3)
Chloride: 111 mmol/L (ref 98–111)
Creatinine, Ser: 1.65 mg/dL — ABNORMAL HIGH (ref 0.61–1.24)
GFR, Estimated: 44 mL/min — ABNORMAL LOW (ref >60)
Glucose, Bld: 81 mg/dL (ref 70–99)
Potassium: 4.9 mmol/L (ref 3.5–5.1)
Sodium: 141 mmol/L (ref 135–145)
Total Bilirubin: 0.5 mg/dL (ref 0.3–1.2)
Total Protein: 6.3 g/dL — ABNORMAL LOW (ref 6.5–8.1)

## 2021-12-01 LAB — CBC
HCT: 34.4 % — ABNORMAL LOW (ref 39.0–52.0)
Hemoglobin: 11.7 g/dL — ABNORMAL LOW (ref 13.0–17.0)
MCH: 34.1 pg — ABNORMAL HIGH (ref 26.0–34.0)
MCHC: 34 g/dL (ref 30.0–36.0)
MCV: 100.3 fL — ABNORMAL HIGH (ref 80.0–100.0)
Platelets: 174 10*3/uL (ref 150–400)
RBC: 3.43 MIL/uL — ABNORMAL LOW (ref 4.22–5.81)
RDW: 15.8 % — ABNORMAL HIGH (ref 11.5–15.5)
WBC: 3.8 10*3/uL — ABNORMAL LOW (ref 4.0–10.5)
nRBC: 0 % (ref 0.0–0.2)

## 2021-12-01 LAB — DIGOXIN LEVEL: Digoxin Level: 0.7 ng/mL — ABNORMAL LOW (ref 0.8–2.0)

## 2021-12-01 LAB — TSH: TSH: 1.204 u[IU]/mL (ref 0.350–4.500)

## 2021-12-01 LAB — POCT INR: INR: 2.9 (ref 2.0–3.0)

## 2021-12-01 NOTE — Patient Instructions (Signed)
Continue taking warfarin 1/2 tablet daily. Continue leafy veggies two days a week only. Recheck INR in 6 weeks. [facility] Clinic [phone] Main Number [phone]  5/8-AMIO DECREASED-TAKING 200MG QD EXCEPT NONE ON Sunday.  NEED 2 COPIES 

## 2021-12-01 NOTE — Progress Notes (Signed)
Advanced Heart Failure Clinic Note   PCP: Ephriam Jenkins, PA (Inactive) Cardiology: Dr. Aundra Dubin  CT Surgeon: Dr. Kipp Brood   Reason for Visit: F/u for Systolic Heart Failure 2/2 ICM s/p CABG   HPI:  71 y.o. AAM w/ h/o anemia, ?prior seizure, former smoker (quit 70 y/o), HLD and newly diagnosed ischemic heart disease/ systolic heart failure, and s/p CABG x 4.   Admitted to [facility] 5/22 for acute CHF and ruled in for NSTEMi.  Diuresed w/ IV lasix and placed on IV heparin. Echo showed severely reduced LVEF <20% w/ global HK, GIIDD and small LV apical thrombus, moderate MR and RV mildly reduced. R/LHC showed severe MVCAD (angiographic details below) and well compensated hemodynamics w/ low/normal filling pressures and preserved CO. RAP 1 mmHg, PCW 6 mmHg, CO 4.9 L/min, CI 2.5 L/min2. cMRI suggested extensive viability (except for apical septal wall and true apex) + small LV thrombus.   Underwent CABG x 4 by Dr. Kipp Brood (LIMA-LAD, SVG-D2, SVG-OM1, SVG-PDA). Post-operative course uncomplicated. GDMT initiated prior to d/c. Also of note, LDL was markedly elevated at 204 mg/dL. Was placed on high intensity statin &  Coumadin for LV thrombus.  Echo in 10/22 showed EF 30-35%, mildly decreased RV systolic function.   Zio patch in 10/22 showed 20% PVCs with short NSVT runs, amiodarone was started.   Zio patch (3-day) 12/22 showed rare PVCs.  S/p [employer] ICD 2/23.  Today he returns for HF follow up. Doing well overall.  No exertional dyspnea or chest pain.  No lightheadedness/syncope. He is working, has custodial job.  No orthopnea/PND.   [employer] device interrogation: Heartlogic score 3, no AF, no VT.   Labs (7/22): K 4.3, creatinine 1.23, LDL 49, TGs 60, digoxin 0.7 Labs (10/22): digoxin 0.8, K 4.4, creatinine 1.09 Labs (1/23): K 4.1, creatinine 1.44, LDL 41 Labs (3/23): K 4.5, creatinine 1.7  PMH: 1. LV thrombus.  2. CAD: NSTEMI in 5/22 with severe 3 vessel disease on  cath.  - CABG (5/22) with LIMA-LAD, SVG-D2, SVG-OM1, SVG-PDA.   3. Hyperlipidemia.  4. Chronic systolic CHF: Ischemic cardiomyopathy.  - Echo (5/22): EF <20%, LV severely decreased, Grade II DD, small LV apical thrombus, RV mildly reduced. - cMRI (5/22): Severe LV enlargement with severe global hypokinesis with regional variation. EF 14%, small apical LV thrombus, normal RV size with mildly decreased systolic function, EF 123456, moderate MR, Based on severe LV dysfunction, there was less late gadolinium enhancement than I would have expected. With the exception of the apical septal wall and the true apex, would expect the remainder of the LV myocardium to be viable. - Echo (10/22): EF 30-35%, global hypokinesis, mildly decreased RV systolic function, IVC normal, no LV thrombus. - s/p [employer] ICD 1/23 5. PVCs: Zio monitor (10/22) with 10% PVCs.  - Zio (12/22): rare PVCs 6. CKD stage 3  Review of systems complete and found to be negative unless listed in HPI.     Current Outpatient Medications  Medication Sig Dispense Refill   amiodarone (PACERONE) 200 MG tablet Take one tablet by mouth daily Monday through Saturday.  Do NOT take on Sunday. 90 tablet 3   aspirin EC 81 MG EC tablet Take 1 tablet (81 mg total) by mouth daily. Swallow whole. 30 tablet 11   atorvastatin (LIPITOR) 80 MG tablet Take 1 tablet (80 mg total) by mouth daily. 90 tablet 3   carvedilol (COREG) 6.25 MG tablet Take 1 tablet (6.25 mg total) by mouth 2 (  two) times daily with a meal. 180 tablet 3   dapagliflozin propanediol (FARXIGA) 10 MG TABS tablet Take 1 tablet (10 mg total) by mouth daily. 30 tablet 11   digoxin (LANOXIN) 0.125 MG tablet TAKE 1 TABLET BY MOUTH DAILY 30 tablet 3   ezetimibe (ZETIA) 10 MG tablet TAKE 1 TABLET BY MOUTH EVERY DAY 90 tablet 3   furosemide (LASIX) 20 MG tablet TAKE 1 TABLET (20 MG TOTAL) BY MOUTH DAILY AS NEEDED FOR FLUID OR EDEMA. 45 tablet 3   Multiple Vitamin (MULTIVITAMIN WITH  MINERALS) TABS tablet Take 1 tablet by mouth daily.     sacubitril-valsartan (ENTRESTO) 24-26 MG Take 1 tablet by mouth 2 (two) times daily. 60 tablet 11   spironolactone (ALDACTONE) 25 MG tablet TAKE 1 TABLET BY MOUTH DAILY 30 tablet 3   warfarin (COUMADIN) 5 MG tablet Take 1/2 to 1 tablet once daily as directed by the coumadin clinic (Patient taking differently: Take 1/2 to 1 tablet once daily as directed by the coumadin clinic and takes 1 tablet on Fridays.) 30 tablet 3   No current facility-administered medications for this encounter.   Allergies  Allergen Reactions   Penicillins Other (See Comments)    childhood   Social History   Socioeconomic History   Marital status: Widowed    Spouse name: Not on file   Number of children: Not on file   Years of education: Not on file   Highest education level: Not on file  Occupational History   Occupation: truck driver  Tobacco Use   Smoking status: Former   Smokeless tobacco: Never   Tobacco comments:    quit in 90's  Substance and Sexual Activity   Alcohol use: Yes    Comment: 1 beer/month   Drug use: No   Sexual activity: Not on file  Other Topics Concern   Not on file  Social History Narrative   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     Social Determinants of Health   Financial Resource Strain: Not on file  Food Insecurity: Not on file  Transportation Needs: Not on file  Physical Activity: Not on file  Stress: Not on file  Social Connections: Not on file  Intimate Partner Violence: Not on file   Family History  Problem Relation Age of Onset   Cervical cancer Mother    Hypertension Father    Colon cancer Neg Hx    Pancreatic cancer Neg Hx    Esophageal cancer Neg Hx    Stomach cancer Neg Hx    BP 110/60   Pulse (!) 56   Wt 73.7 kg (162 lb 6.4 oz)   SpO2 98%   BMI 21.43 kg/m   Wt Readings from Last 3 Encounters:  12/01/21 73.7 kg (162 lb 6.4 oz)  11/03/21 73.5 kg (162 lb)  09/01/21 72.1 kg (159 lb)    PHYSICAL EXAM: General: NAD Neck: No JVD, no thyromegaly or thyroid nodule.  Lungs: Clear to auscultation bilaterally with normal respiratory effort. CV: Nondisplaced PMI.  Heart regular S1/S2, no S3/S4, no murmur.  No peripheral edema.  No carotid bruit.  Normal pedal pulses.  Abdomen: Soft, nontender, no hepatosplenomegaly, no distention.  Skin: Intact without lesions or rashes.  Neurologic: Alert and oriented x 3.  Psych: Normal affect. Extremities: No clubbing or cyanosis.  HEENT: Normal.   ASSESSMENT & PLAN: 1. CAD: NSTEMI (5/22). Cath w/ severe multivessel CAD. S/p LIMA-LAD, SVG-D2, SVG-OM1, SVG-PDA.  No chest pain.  - Continue ASA  81.  - Continue atorvastatin 80 mg daily. Good lipids 1/23. 2. Chronic Systolic Heart Failure: Ischemic CMP. EF < 20% on echo in 5/22. cMRI suggested extensive viability. Now s/p CABG. Echo in 10/22 showed EF 30-35%.  High percentage of PVCs may have contributed to persistent low EF. S/p [employer] ICD 1/23. NYHA Class II. He is not volume overloaded. - Continue Lasix 20 mg prn, has not had to use.  - Continue Entresto 24-26 mg bid. No BP room to increase. - Continue Farxiga 10 mg daily.  - Continue spiro 25 mg daily. BMET today. - Continue digoxin 0.0625 mg daily. Check digoxin level today. - Continue Coreg 6.25 mg bid.   - Continue amiodarone to suppress PVCs.  3. HLD: Good lipids in 1/23.  - Continue Zetia and atorvastatin.  4. LV Thrombus: small thrombus noted on cMRI. - On Coumadin. Followed by [facility].  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 12/22 Zio showed rare PVCs. - Continue amiodarone, check LFTs and TSH today.  He will need a regular eye exam.   Followup in 3 months with APP   Loralie Champagne, MD 12/01/21

## 2021-12-01 NOTE — Patient Instructions (Signed)
There has been no changes to your medications. ? ?Labs done today, your results will be available in MyChart, we will contact you for abnormal readings. ? ? ?Your physician recommends that you schedule a follow-up appointment in: 3 months.   ? ?If you have any questions or concerns before your next appointment please send us a message through mychart or call our office at [phone].   ? ?TO LEAVE A MESSAGE FOR THE NURSE SELECT OPTION 2, PLEASE LEAVE A MESSAGE INCLUDING: ?YOUR NAME ?DATE OF BIRTH ?CALL BACK NUMBER ?REASON FOR CALL**this is important as we prioritize the call backs ? ?YOU WILL RECEIVE A CALL BACK THE SAME DAY AS LONG AS YOU CALL BEFORE 4:00 PM ? ?At the [facility], you and your health needs are our priority. As part of our continuing mission to provide you with exceptional heart care, we have created designated Provider Care Teams. These Care Teams include your primary Cardiologist (physician) and Advanced Practice Providers (APPs- Physician Assistants and Nurse Practitioners) who all work together to provide you with the care you need, when you need it.  ? ?You may see any of the following providers on your designated Care Team at your next follow up: ?Dr Daniel Bensimhon ?Dr Dalton McLean ?Amy Clegg, NP ?Brittainy Simmons, PA ?Jessica Milford,NP ?Lindsay Finch, PA ?Lauren Kemp, PharmD ? ? ?Please be sure to bring in all your medications bottles to every appointment.  ? ? ?

## 2021-12-03 ENCOUNTER — Other Ambulatory Visit (HOSPITAL_COMMUNITY): Payer: Self-pay | Admitting: Internal Medicine

## 2021-12-05 ENCOUNTER — Other Ambulatory Visit: Payer: Self-pay | Admitting: Cardiology

## 2021-12-05 ENCOUNTER — Encounter (HOSPITAL_COMMUNITY): Payer: Federal, State, Local not specified - PPO | Admitting: Cardiology

## 2021-12-06 ENCOUNTER — Other Ambulatory Visit (HOSPITAL_COMMUNITY): Payer: Self-pay | Admitting: Cardiology

## 2021-12-08 ENCOUNTER — Other Ambulatory Visit (HOSPITAL_COMMUNITY): Payer: Self-pay | Admitting: *Deleted

## 2022-01-01 ENCOUNTER — Other Ambulatory Visit (HOSPITAL_COMMUNITY): Payer: Self-pay | Admitting: Cardiology

## 2022-01-12 ENCOUNTER — Ambulatory Visit (INDEPENDENT_AMBULATORY_CARE_PROVIDER_SITE_OTHER): Payer: Federal, State, Local not specified - PPO

## 2022-01-12 DIAGNOSIS — Z5181 Encounter for therapeutic drug level monitoring: Secondary | ICD-10-CM | POA: Diagnosis not present

## 2022-01-12 DIAGNOSIS — I513 Intracardiac thrombosis, not elsewhere classified: Secondary | ICD-10-CM

## 2022-01-12 LAB — POCT INR: INR: 2.4 (ref 2.0–3.0)

## 2022-01-12 NOTE — Patient Instructions (Signed)
Continue taking warfarin 1/2 tablet daily. Continue leafy veggies two days a week only. Recheck INR in 6 weeks. Cou[facility]in Clinic [phone] Main Number [phone]  5/8-AMIO DECREASED-TAKING 200MG  QD EXCEPT NONE ON Sunday.  NEED 2 COPIES

## 2022-02-02 ENCOUNTER — Ambulatory Visit (INDEPENDENT_AMBULATORY_CARE_PROVIDER_SITE_OTHER): Payer: Federal, State, Local not specified - PPO

## 2022-02-02 DIAGNOSIS — I5022 Chronic systolic (congestive) heart failure: Secondary | ICD-10-CM

## 2022-02-03 LAB — CUP PACEART REMOTE DEVICE CHECK
Battery Remaining Longevity: 156 mo
Battery Remaining Percentage: 100 %
Brady Statistic RA Percent Paced: 3 %
Brady Statistic RV Percent Paced: 0 %
Date Time Interrogation Session: 20230807002100
HighPow Impedance: 66 Ohm
Implantable Lead Implant Date: 20230203
Implantable Lead Implant Date: 20230203
Implantable Lead Location: 753859
Implantable Lead Location: 753860
Implantable Lead Model: 138
Implantable Lead Model: 7841
Implantable Lead Serial Number: 1237694
Implantable Lead Serial Number: 302781
Implantable Pulse Generator Implant Date: 20230203
Lead Channel Impedance Value: 504 Ohm
Lead Channel Impedance Value: 678 Ohm
Lead Channel Pacing Threshold Amplitude: 0.6 V
Lead Channel Pacing Threshold Amplitude: 0.8 V
Lead Channel Pacing Threshold Pulse Width: 0.4 ms
Lead Channel Pacing Threshold Pulse Width: 0.4 ms
Lead Channel Setting Pacing Amplitude: 2 V
Lead Channel Setting Pacing Amplitude: 2.5 V
Lead Channel Setting Pacing Pulse Width: 0.4 ms
Lead Channel Setting Sensing Sensitivity: 0.5 mV
Pulse Gen Serial Number: 233963

## 2022-02-04 ENCOUNTER — Other Ambulatory Visit (HOSPITAL_COMMUNITY): Payer: Self-pay | Admitting: Cardiology

## 2022-02-19 ENCOUNTER — Encounter (HOSPITAL_BASED_OUTPATIENT_CLINIC_OR_DEPARTMENT_OTHER): Payer: Self-pay | Admitting: Emergency Medicine

## 2022-02-19 ENCOUNTER — Emergency Department (HOSPITAL_BASED_OUTPATIENT_CLINIC_OR_DEPARTMENT_OTHER)
Admission: EM | Admit: 2022-02-19 | Discharge: 2022-02-19 | Disposition: A | Discharge status: Home / Self Care | Payer: Federal, State, Local not specified - PPO | Attending: Emergency Medicine | Admitting: Emergency Medicine

## 2022-02-19 ENCOUNTER — Other Ambulatory Visit: Payer: Self-pay

## 2022-02-19 DIAGNOSIS — Z041 Encounter for examination and observation following transport accident: Secondary | ICD-10-CM | POA: Insufficient documentation

## 2022-02-19 DIAGNOSIS — Z7982 Long term (current) use of aspirin: Secondary | ICD-10-CM | POA: Insufficient documentation

## 2022-02-19 DIAGNOSIS — Z7901 Long term (current) use of anticoagulants: Secondary | ICD-10-CM | POA: Insufficient documentation

## 2022-02-19 DIAGNOSIS — Y92410 Unspecified street and highway as the place of occurrence of the external cause: Secondary | ICD-10-CM | POA: Diagnosis not present

## 2022-02-19 NOTE — Discharge Instructions (Addendum)
Your exam today was overall reassuring.  You can take Tylenol for pain and aches you develop over the next day or 2.  If any worsening or concerning symptoms please return to the emergency room for evaluation.
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
## 2022-02-23 ENCOUNTER — Ambulatory Visit: Payer: Federal, State, Local not specified - PPO

## 2022-02-26 ENCOUNTER — Other Ambulatory Visit (HOSPITAL_COMMUNITY): Payer: Self-pay | Admitting: Cardiology

## 2022-02-26 DIAGNOSIS — I513 Intracardiac thrombosis, not elsewhere classified: Secondary | ICD-10-CM

## 2022-03-09 ENCOUNTER — Encounter (HOSPITAL_COMMUNITY): Payer: Federal, State, Local not specified - PPO

## 2022-03-09 ENCOUNTER — Ambulatory Visit: Payer: Federal, State, Local not specified - PPO

## 2022-03-10 NOTE — Progress Notes (Signed)
Remote ICD transmission.   

## 2022-03-16 ENCOUNTER — Ambulatory Visit: Payer: Federal, State, Local not specified - PPO | Attending: Cardiology

## 2022-03-16 DIAGNOSIS — Z5181 Encounter for therapeutic drug level monitoring: Secondary | ICD-10-CM

## 2022-03-16 LAB — POCT INR: INR: 1.5 — AB (ref 2.0–3.0)

## 2022-03-16 NOTE — Patient Instructions (Signed)
Description   Take 1 tablet today and 1 tablet tomorrow and then continue taking warfarin 1/2 tablet daily. Continue leafy veggies two days a week only. Recheck INR in 2 weeks. [facility]in Clinic [phone] Main Number [phone]  5/8-AMIO DECREASED-TAKING 200MG  QD EXCEPT NONE ON Sunday.  NEED 2 COPIES

## 2022-03-25 ENCOUNTER — Other Ambulatory Visit (HOSPITAL_COMMUNITY): Payer: Self-pay | Admitting: Cardiology

## 2022-03-30 ENCOUNTER — Ambulatory Visit: Payer: Federal, State, Local not specified - PPO | Attending: Cardiovascular Disease | Admitting: *Deleted

## 2022-03-30 ENCOUNTER — Encounter (HOSPITAL_COMMUNITY): Payer: Federal, State, Local not specified - PPO

## 2022-03-30 DIAGNOSIS — Z5181 Encounter for therapeutic drug level monitoring: Secondary | ICD-10-CM

## 2022-03-30 DIAGNOSIS — I513 Intracardiac thrombosis, not elsewhere classified: Secondary | ICD-10-CM | POA: Diagnosis not present

## 2022-03-30 LAB — POCT INR: INR: 1.5 — AB (ref 2.0–3.0)

## 2022-03-30 NOTE — Patient Instructions (Signed)
Description   Take 1 tablet today and 1 tablet tomorrow and then start taking warfarin 1/2 tablet daily except 1 tablet on Fridays. Continue leafy veggies two days a week only. Recheck INR in 2 weeks. [facility] [phone] Main Number [phone]  5/8-AMIO DECREASED-TAKING 200MG  QD EXCEPT NONE ON Sunday.  NEED 2 COPIES

## 2022-04-03 ENCOUNTER — Encounter (HOSPITAL_COMMUNITY): Payer: Self-pay

## 2022-04-03 ENCOUNTER — Ambulatory Visit (HOSPITAL_COMMUNITY)
Admission: RE | Admit: 2022-04-03 | Discharge: 2022-04-03 | Disposition: A | Discharge status: Home / Self Care | Payer: Federal, State, Local not specified - PPO | Source: Ambulatory Visit | Attending: Cardiology | Admitting: Cardiology

## 2022-04-03 VITALS — BP 100/60 | HR 65 | Wt 160.6 lb

## 2022-04-03 DIAGNOSIS — I252 Old myocardial infarction: Secondary | ICD-10-CM | POA: Diagnosis not present

## 2022-04-03 DIAGNOSIS — Z8249 Family history of ischemic heart disease and other diseases of the circulatory system: Secondary | ICD-10-CM | POA: Diagnosis not present

## 2022-04-03 DIAGNOSIS — E785 Hyperlipidemia, unspecified: Secondary | ICD-10-CM | POA: Insufficient documentation

## 2022-04-03 DIAGNOSIS — I493 Ventricular premature depolarization: Secondary | ICD-10-CM | POA: Insufficient documentation

## 2022-04-03 DIAGNOSIS — I2510 Atherosclerotic heart disease of native coronary artery without angina pectoris: Secondary | ICD-10-CM | POA: Insufficient documentation

## 2022-04-03 DIAGNOSIS — Z79899 Other long term (current) drug therapy: Secondary | ICD-10-CM | POA: Diagnosis not present

## 2022-04-03 DIAGNOSIS — I255 Ischemic cardiomyopathy: Secondary | ICD-10-CM | POA: Insufficient documentation

## 2022-04-03 DIAGNOSIS — Z87891 Personal history of nicotine dependence: Secondary | ICD-10-CM | POA: Diagnosis not present

## 2022-04-03 DIAGNOSIS — Z86718 Personal history of other venous thrombosis and embolism: Secondary | ICD-10-CM | POA: Diagnosis not present

## 2022-04-03 DIAGNOSIS — N183 Chronic kidney disease, stage 3 unspecified: Secondary | ICD-10-CM | POA: Insufficient documentation

## 2022-04-03 DIAGNOSIS — Z7984 Long term (current) use of oral hypoglycemic drugs: Secondary | ICD-10-CM | POA: Insufficient documentation

## 2022-04-03 DIAGNOSIS — Z951 Presence of aortocoronary bypass graft: Secondary | ICD-10-CM | POA: Insufficient documentation

## 2022-04-03 DIAGNOSIS — Z7901 Long term (current) use of anticoagulants: Secondary | ICD-10-CM | POA: Insufficient documentation

## 2022-04-03 DIAGNOSIS — Z7982 Long term (current) use of aspirin: Secondary | ICD-10-CM | POA: Insufficient documentation

## 2022-04-03 DIAGNOSIS — I5022 Chronic systolic (congestive) heart failure: Secondary | ICD-10-CM | POA: Insufficient documentation

## 2022-04-03 DIAGNOSIS — Z95810 Presence of automatic (implantable) cardiac defibrillator: Secondary | ICD-10-CM | POA: Insufficient documentation

## 2022-04-03 LAB — COMPREHENSIVE METABOLIC PANEL
ALT: 42 U/L (ref 0–44)
AST: 33 U/L (ref 15–41)
Albumin: 3.7 g/dL (ref 3.5–5.0)
Alkaline Phosphatase: 44 U/L (ref 38–126)
Anion gap: 9 (ref 5–15)
BUN: 20 mg/dL (ref 8–23)
CO2: 25 mmol/L (ref 22–32)
Calcium: 9.4 mg/dL (ref 8.9–10.3)
Chloride: 107 mmol/L (ref 98–111)
Creatinine, Ser: 1.37 mg/dL — ABNORMAL HIGH (ref 0.61–1.24)
GFR, Estimated: 55 mL/min — ABNORMAL LOW (ref >60)
Glucose, Bld: 85 mg/dL (ref 70–99)
Potassium: 4.1 mmol/L (ref 3.5–5.1)
Sodium: 141 mmol/L (ref 135–145)
Total Bilirubin: 0.8 mg/dL (ref 0.3–1.2)
Total Protein: 6.4 g/dL — ABNORMAL LOW (ref 6.5–8.1)

## 2022-04-03 LAB — LIPID PANEL
Cholesterol: 113 mg/dL (ref 0–200)
HDL: 45 mg/dL (ref >40)
LDL Cholesterol: 59 mg/dL (ref 0–99)
Total CHOL/HDL Ratio: 2.5 RATIO
Triglycerides: 44 mg/dL (ref <150)
VLDL: 9 mg/dL (ref 0–40)

## 2022-04-03 LAB — TSH: TSH: 0.68 u[IU]/mL (ref 0.350–4.500)

## 2022-04-03 LAB — DIGOXIN LEVEL: Digoxin Level: 0.7 ng/mL — ABNORMAL LOW (ref 0.8–2.0)

## 2022-04-03 LAB — T4, FREE: Free T4: 1.21 ng/dL — ABNORMAL HIGH (ref 0.61–1.12)

## 2022-04-03 NOTE — Patient Instructions (Signed)
Thank you for coming in today  Labs were done today, if any labs are abnormal the clinic will call you No news is good news  Your physician recommends that you schedule a follow-up appointment in:  3 months with Dr. Aundra Dubin with echocardiogram  Your physician has requested that you have an echocardiogram. Echocardiography is a painless test that uses sound waves to create images of your heart. It provides your doctor with information about the size and shape of your heart and how well your heart's chambers and valves are working. This procedure takes approximately one hour. There are no restrictions for this procedure.     Do the following things EVERYDAY: Weigh yourself in the morning before breakfast. Write it down and keep it in a log. Take your medicines as prescribed Eat low salt foods--Limit salt (sodium) to 2000 mg per day.  Stay as active as you can everyday Limit all fluids for the day to less than 2 liters  At the [facility], you and your health needs are our priority. As part of our continuing mission to provide you with exceptional heart care, we have created designated Provider Care Teams. These Care Teams include your primary Cardiologist (physician) and Advanced Practice Providers (APPs- Physician Assistants and Nurse Practitioners) who all work together to provide you with the care you need, when you need it.   You may see any of the following providers on your designated Care Team at your next follow up: Dr Glori Bickers Dr Loralie Champagne Dr. Roxana Hires, NP Lyda Jester, [place] [facility] [place], [place] Forestine Na, NP Audry Riles, PharmD   Please be sure to bring in all your medications bottles to every appointment.   If you have any questions or concerns before your next appointment please send [place] a message through [place] or call our office at [phone].    TO LEAVE A MESSAGE FOR THE NURSE SELECT OPTION 2, PLEASE  LEAVE A MESSAGE INCLUDING: YOUR NAME DATE OF BIRTH CALL BACK NUMBER REASON FOR CALL**this is important as we prioritize the call backs  YOU WILL RECEIVE A CALL BACK THE SAME DAY AS LONG AS YOU CALL BEFORE 4:00 PM

## 2022-04-03 NOTE — Progress Notes (Signed)
Advanced [facility] Failure Clinic Note   PCP: Ephriam Jenkins, PA (Inactive) Cardiology: Dr. Aundra Dubin  CT Surgeon: Dr. Kipp Brood   Reason for Visit: F/u for Systolic Heart Failure 2/2 ICM s/p CABG   HPI:  71 y.o. AAM w/ h/o anemia, ?prior seizure, former smoker (quit 71 y/o), HLD and newly diagnosed ischemic heart disease/ systolic heart failure, and s/p CABG x 4.   Admitted to [facility] 5/22 for acute CHF and ruled in for NSTEMi.  Diuresed w/ IV lasix and placed on IV heparin. Echo showed severely reduced LVEF <20% w/ global HK, GIIDD and small LV apical thrombus, moderate MR and RV mildly reduced. R/LHC showed severe MVCAD (angiographic details below) and well compensated hemodynamics w/ low/normal filling pressures and preserved CO. RAP 1 mmHg, PCW 6 mmHg, CO 4.9 L/min, CI 2.5 L/min2. cMRI suggested extensive viability (except for apical septal wall and true apex) + small LV thrombus.   Underwent CABG x 4 by Dr. Kipp Brood (LIMA-LAD, SVG-D2, SVG-OM1, SVG-PDA). Post-operative course uncomplicated. GDMT initiated prior to d/c. Also of note, LDL was markedly elevated at 204 mg/dL. Was placed on high intensity statin &  Coumadin for LV thrombus.  Echo in 10/22 showed EF 30-35%, mildly decreased RV systolic function.   Zio patch in 10/22 showed 20% PVCs with short NSVT runs, amiodarone was started.   Zio patch (3-day) 12/22 showed rare PVCs.  S/p [employer] ICD 2/23.  Today he returns for HF follow up. Doing well. Back at work, active job doing Holiday representative. Able to perform job duties w/o much limitation. Denies CP. Reports full med compliance. BP soft 100/6 but no orthostatic symptoms. Device interrogation shows normal impedence. HL score 0. No AT/AF. No VT. Activity level 4.9 hr/day.   EKG shows NSR, no PVCs. QTc ok 430 ms.    [employer] device interrogation: Heartlogic score 0, no AF, no VT. Activity level 4.9 hr/day   Labs (7/22): K 4.3, creatinine 1.23, LDL 49,  TGs 60, digoxin 0.7 Labs (10/22): digoxin 0.8, K 4.4, creatinine 1.09 Labs (1/23): K 4.1, creatinine 1.44, LDL 41 Labs (3/23): K 4.5, creatinine 1.7 Labs (6/23): K 4.9, creatinine 1.65   PMH: 1. LV thrombus.  2. CAD: NSTEMI in 5/22 with severe 3 vessel disease on cath.  - CABG (5/22) with LIMA-LAD, SVG-D2, SVG-OM1, SVG-PDA.   3. Hyperlipidemia.  4. Chronic systolic CHF: Ischemic cardiomyopathy.  - Echo (5/22): EF <20%, LV severely decreased, Grade II DD, small LV apical thrombus, RV mildly reduced. - cMRI (5/22): Severe LV enlargement with severe global hypokinesis with regional variation. EF 14%, small apical LV thrombus, normal RV size with mildly decreased systolic function, EF 78%, moderate MR, Based on severe LV dysfunction, there was less late gadolinium enhancement than I would have expected. With the exception of the apical septal wall and the true apex, would expect the remainder of the LV myocardium to be viable. - Echo (10/22): EF 30-35%, global hypokinesis, mildly decreased RV systolic function, IVC normal, no LV thrombus. - s/p [employer] ICD 1/23 5. PVCs: Zio monitor (10/22) with 10% PVCs.  - Zio (12/22): rare PVCs 6. CKD stage 3  Review of systems complete and found to be negative unless listed in HPI.     Current Outpatient Medications  Medication Sig Dispense Refill   amiodarone (PACERONE) 200 MG tablet Take one tablet by mouth daily Monday through Saturday.  Do NOT take on Sunday. 90 tablet 3   aspirin EC 81 MG EC  tablet Take 1 tablet (81 mg total) by mouth daily. Swallow whole. 30 tablet 11   atorvastatin (LIPITOR) 80 MG tablet TAKE 1 TABLET BY MOUTH EVERY DAY 90 tablet 3   carvedilol (COREG) 6.25 MG tablet TAKE 1 TABLET BY MOUTH 2 TIMES DAILY WITH A MEAL. 180 tablet 3   dapagliflozin propanediol (FARXIGA) 10 MG TABS tablet Take 1 tablet (10 mg total) by mouth daily. 30 tablet 11   digoxin (LANOXIN) 0.125 MG tablet TAKE 1 TABLET BY MOUTH DAILY 30 tablet 3    ENTRESTO 24-26 MG TAKE 1 TABLET BY MOUTH TWICE A DAY 60 tablet 11   ezetimibe (ZETIA) 10 MG tablet TAKE 1 TABLET BY MOUTH EVERY DAY 90 tablet 3   furosemide (LASIX) 20 MG tablet TAKE 1 TABLET (20 MG TOTAL) BY MOUTH DAILY AS NEEDED FOR FLUID OR EDEMA. 45 tablet 3   Multiple Vitamin (MULTIVITAMIN WITH MINERALS) TABS tablet Take 1 tablet by mouth daily.     spironolactone (ALDACTONE) 25 MG tablet TAKE 1 TABLET BY MOUTH EVERY DAY 90 tablet 3   warfarin (COUMADIN) 5 MG tablet TAKE 1/2 TO 1 TABLET ONCE DAILY AS DIRECTED BY THE COUMADIN CLINIC 30 tablet 3   No current facility-administered medications for this encounter.   Allergies  Allergen Reactions   Penicillins Other (See Comments)    childhood   Social History   Socioeconomic History   Marital status: Widowed    Spouse name: Not on file   Number of children: Not on file   Years of education: Not on file   Highest education level: Not on file  Occupational History   Occupation: truck driver  Tobacco Use   Smoking status: Former   Smokeless tobacco: Never   Tobacco comments:    quit in 90's  Vaping Use   Vaping Use: Never used  Substance and Sexual Activity   Alcohol use: Yes    Comment: 1 beer/month   Drug use: No   Sexual activity: Not on file  Other Topics Concern   Not on file  Social History Narrative   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     Social Determinants of Health   Financial Resource Strain: Low Risk  (10/31/2020)   Overall Financial Resource Strain (CARDIA)    Difficulty of Paying Living Expenses: Not very hard  Food Insecurity: No Food Insecurity (10/31/2020)   Hunger Vital Sign    Worried About Running Out of Food in the Last Year: Never true    Ran Out of Food in the Last Year: Never true  Transportation Needs: No Transportation Needs (10/31/2020)   PRAPARE - Hydrologist (Medical): No    Lack of Transportation (Non-Medical): No  Physical Activity: Not on file  Stress: Not on  file  Social Connections: Not on file  Intimate Partner Violence: Not on file   Family History  Problem Relation Age of Onset   Cervical cancer Mother    Hypertension Father    Colon cancer Neg Hx    Pancreatic cancer Neg Hx    Esophageal cancer Neg Hx    Stomach cancer Neg Hx    BP 100/60   Pulse 65   Wt 72.8 kg (160 lb 9.6 oz)   SpO2 98%   BMI 21.19 kg/m   Wt Readings from Last 3 Encounters:  04/03/22 72.8 kg (160 lb 9.6 oz)  02/19/22 71.7 kg (158 lb)  12/01/21 73.7 kg (162 lb 6.4 oz)  PHYSICAL EXAM: General:  Well appearing. No respiratory difficulty HEENT: normal Neck: supple. no JVD. Carotids 2+ bilat; no bruits. No lymphadenopathy or thyromegaly appreciated. Cor: PMI nondisplaced. Regular rate & rhythm. No rubs, gallops or murmurs. Lungs: clear Abdomen: soft, nontender, nondistended. No hepatosplenomegaly. No bruits or masses. Good bowel sounds. Extremities: no cyanosis, clubbing, rash, edema Neuro: alert & oriented x 3, cranial nerves grossly intact. moves all 4 extremities w/o difficulty. Affect pleasant.   ASSESSMENT & PLAN: 1. CAD: NSTEMI (5/22). Cath w/ severe multivessel CAD. S/p LIMA-LAD, SVG-D2, SVG-OM1, SVG-PDA.  Stable w/o CP. - Continue ASA 81 mg daily  - Continue atorvastatin 80 mg daily - Continue Coreg 6.25 mg bid  2. Chronic Systolic Heart Failure: Ischemic CMP. EF < 20% on echo in 5/22. cMRI suggested extensive viability. Now s/p CABG. Echo in 10/22 showed EF 30-35%.  High percentage of PVCs may have contributed to persistent low EF. S/p [employer] ICD 1/23. NYHA Class II. He is not volume overloaded on exam and device interrogation. HL score 0. No VT.  - Continue Lasix 20 mg prn, has not had to use.  - Continue Entresto 24-26 mg bid. BP too soft for titration  - Continue Farxiga 10 mg daily.  - Continue spiro 25 mg daily.  - Continue digoxin 0.0625 mg daily. Check digoxin level today. - Continue Coreg 6.25 mg bid. BP too soft for titration  -  Continue amiodarone to suppress PVCs. - Repeat Echo next visit   3. HLD: LDL goal < 55 - On Zetia and atorvastatin.  - Check FLP and HFTs today  4. LV Thrombus: small thrombus noted on cMRI. - On Coumadin. Followed by [facility].  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 12/22 Zio showed rare PVCs. - no PVCs on EKG today, QTc ok 430 ms  - Continue amiodarone, check LFTs and TSH today.  He will need a regular eye exam.    Followup in 3 months with Dr. Aundra Dubin w/ repeat echo    Lyda Jester, PA-C 04/03/22

## 2022-04-04 LAB — T3, FREE: T3, Free: 2.6 pg/mL (ref 2.0–4.4)

## 2022-04-12 ENCOUNTER — Other Ambulatory Visit (HOSPITAL_COMMUNITY): Payer: Self-pay | Admitting: Cardiology

## 2022-04-13 ENCOUNTER — Ambulatory Visit: Payer: Federal, State, Local not specified - PPO | Attending: Cardiology

## 2022-04-13 DIAGNOSIS — Z5181 Encounter for therapeutic drug level monitoring: Secondary | ICD-10-CM | POA: Diagnosis not present

## 2022-04-13 LAB — POCT INR: INR: 1.5 — AB (ref 2.0–3.0)

## 2022-04-13 NOTE — Patient Instructions (Signed)
Take 1 tablet today and 1 tablet tomorrow and then start taking warfarin 1/2 tablet daily except 1 tablet on Fridays. Continue leafy veggies one days a week only. Recheck INR in 2 weeks. [facility] [phone] Main Number [phone]  5/8-AMIO DECREASED-TAKING 200MG  QD EXCEPT NONE ON Sunday.  NEED 2 COPIES

## 2022-04-14 ENCOUNTER — Other Ambulatory Visit (HOSPITAL_COMMUNITY): Payer: Self-pay | Admitting: Cardiology

## 2022-04-27 ENCOUNTER — Ambulatory Visit: Payer: Federal, State, Local not specified - PPO | Attending: Cardiology

## 2022-04-27 DIAGNOSIS — Z5181 Encounter for therapeutic drug level monitoring: Secondary | ICD-10-CM

## 2022-04-27 LAB — POCT INR: INR: 2.6 (ref 2.0–3.0)

## 2022-04-27 NOTE — Patient Instructions (Signed)
Description   Continue taking warfarin 1/2 tablet daily except 1 tablet on Fridays.  Continue leafy veggies one days a week only. Recheck INR in 3 weeks. [facility] [phone] Main Number [phone]  5/8-AMIO DECREASED-TAKING 200MG  QD EXCEPT NONE ON Sunday.

## 2022-05-04 ENCOUNTER — Ambulatory Visit (INDEPENDENT_AMBULATORY_CARE_PROVIDER_SITE_OTHER): Payer: Federal, State, Local not specified - PPO

## 2022-05-04 DIAGNOSIS — Z95810 Presence of automatic (implantable) cardiac defibrillator: Secondary | ICD-10-CM | POA: Diagnosis not present

## 2022-05-04 DIAGNOSIS — I5022 Chronic systolic (congestive) heart failure: Secondary | ICD-10-CM

## 2022-05-04 DIAGNOSIS — I255 Ischemic cardiomyopathy: Secondary | ICD-10-CM

## 2022-05-06 LAB — CUP PACEART REMOTE DEVICE CHECK
Battery Remaining Longevity: 156 mo
Battery Remaining Percentage: 100 %
Brady Statistic RA Percent Paced: 2 %
Brady Statistic RV Percent Paced: 0 %
Date Time Interrogation Session: 20231106002100
HighPow Impedance: 70 Ohm
Implantable Lead Connection Status: 753985
Implantable Lead Connection Status: 753985
Implantable Lead Implant Date: 20230203
Implantable Lead Implant Date: 20230203
Implantable Lead Location: 753859
Implantable Lead Location: 753860
Implantable Lead Model: 138
Implantable Lead Model: 7841
Implantable Lead Serial Number: 1237694
Implantable Lead Serial Number: 302781
Implantable Pulse Generator Implant Date: 20230203
Lead Channel Impedance Value: 534 Ohm
Lead Channel Impedance Value: 700 Ohm
Lead Channel Pacing Threshold Amplitude: 0.5 V
Lead Channel Pacing Threshold Amplitude: 0.7 V
Lead Channel Pacing Threshold Pulse Width: 0.4 ms
Lead Channel Pacing Threshold Pulse Width: 0.4 ms
Lead Channel Setting Pacing Amplitude: 2 V
Lead Channel Setting Pacing Amplitude: 2.5 V
Lead Channel Setting Pacing Pulse Width: 0.4 ms
Lead Channel Setting Sensing Sensitivity: 0.5 mV
Pulse Gen Serial Number: 233963
Zone Setting Status: 755011

## 2022-05-18 ENCOUNTER — Ambulatory Visit: Payer: Federal, State, Local not specified - PPO

## 2022-06-01 ENCOUNTER — Ambulatory Visit: Payer: Federal, State, Local not specified - PPO | Attending: Cardiology

## 2022-06-01 DIAGNOSIS — Z5181 Encounter for therapeutic drug level monitoring: Secondary | ICD-10-CM

## 2022-06-01 DIAGNOSIS — I513 Intracardiac thrombosis, not elsewhere classified: Secondary | ICD-10-CM | POA: Diagnosis not present

## 2022-06-01 LAB — POCT INR: INR: 2.4 (ref 2.0–3.0)

## 2022-06-01 NOTE — Patient Instructions (Signed)
Continue taking warfarin 1/2 tablet daily except 1 tablet on Fridays.  Continue leafy veggies one days a week only. Recheck INR in 5 weeks. [facility] [phone] Main Number [phone]  5/8-AMIO DECREASED-TAKING 200MG  QD EXCEPT NONE ON Sunday.

## 2022-06-11 NOTE — Progress Notes (Signed)
Remote ICD transmission.   

## 2022-07-05 ENCOUNTER — Other Ambulatory Visit (HOSPITAL_COMMUNITY): Payer: Self-pay | Admitting: Cardiology

## 2022-07-06 ENCOUNTER — Ambulatory Visit: Payer: Federal, State, Local not specified - PPO | Attending: Cardiology | Admitting: *Deleted

## 2022-07-06 DIAGNOSIS — Z5181 Encounter for therapeutic drug level monitoring: Secondary | ICD-10-CM

## 2022-07-06 DIAGNOSIS — I513 Intracardiac thrombosis, not elsewhere classified: Secondary | ICD-10-CM

## 2022-07-06 LAB — POCT INR: INR: 2.7 (ref 2.0–3.0)

## 2022-07-06 NOTE — Patient Instructions (Signed)
Description   Continue taking warfarin 1/2 tablet daily except 1 tablet on Fridays.  Continue leafy veggies one days a week only. Recheck INR in 6 weeks. [facility] [phone] Main Number [phone]  5/8-AMIO DECREASED-TAKING 200MG  QD EXCEPT NONE ON Sunday.

## 2022-07-16 ENCOUNTER — Other Ambulatory Visit (HOSPITAL_COMMUNITY): Payer: Self-pay | Admitting: Cardiology

## 2022-07-16 DIAGNOSIS — I513 Intracardiac thrombosis, not elsewhere classified: Secondary | ICD-10-CM

## 2022-07-16 NOTE — Telephone Encounter (Signed)
Prescription refill request received for warfarin Lov: 04/03/22 (CHF)  Next INR check: 08/17/22 Warfarin tablet strength: 5mg   Appropriate dose and refill sent to requested pharmacy.

## 2022-08-03 ENCOUNTER — Ambulatory Visit: Payer: Federal, State, Local not specified - PPO

## 2022-08-03 DIAGNOSIS — I255 Ischemic cardiomyopathy: Secondary | ICD-10-CM

## 2022-08-03 DIAGNOSIS — I5022 Chronic systolic (congestive) heart failure: Secondary | ICD-10-CM | POA: Diagnosis not present

## 2022-08-04 LAB — CUP PACEART REMOTE DEVICE CHECK
Battery Remaining Longevity: 150 mo
Battery Remaining Percentage: 100 %
Brady Statistic RA Percent Paced: 1 %
Brady Statistic RV Percent Paced: 0 %
Date Time Interrogation Session: 20240205002000
HighPow Impedance: 72 Ohm
Implantable Lead Connection Status: 753985
Implantable Lead Connection Status: 753985
Implantable Lead Implant Date: 20230203
Implantable Lead Implant Date: 20230203
Implantable Lead Location: 753859
Implantable Lead Location: 753860
Implantable Lead Model: 138
Implantable Lead Model: 7841
Implantable Lead Serial Number: 1237694
Implantable Lead Serial Number: 302781
Implantable Pulse Generator Implant Date: 20230203
Lead Channel Impedance Value: 535 Ohm
Lead Channel Impedance Value: 690 Ohm
Lead Channel Pacing Threshold Amplitude: 0.5 V
Lead Channel Pacing Threshold Amplitude: 0.7 V
Lead Channel Pacing Threshold Pulse Width: 0.4 ms
Lead Channel Pacing Threshold Pulse Width: 0.4 ms
Lead Channel Setting Pacing Amplitude: 2 V
Lead Channel Setting Pacing Amplitude: 2.5 V
Lead Channel Setting Pacing Pulse Width: 0.4 ms
Lead Channel Setting Sensing Sensitivity: 0.5 mV
Pulse Gen Serial Number: 233963
Zone Setting Status: 755011

## 2022-08-11 ENCOUNTER — Other Ambulatory Visit (HOSPITAL_COMMUNITY): Payer: Self-pay | Admitting: Internal Medicine

## 2022-08-11 DIAGNOSIS — I513 Intracardiac thrombosis, not elsewhere classified: Secondary | ICD-10-CM

## 2022-08-11 NOTE — Telephone Encounter (Signed)
Prescription refill request received for warfarin  Lov: Mclean, 12/01/2021 Next INR check: 2/19 Warfarin tablet strength: 25m   Refill sent.

## 2022-08-17 ENCOUNTER — Ambulatory Visit: Payer: Federal, State, Local not specified - PPO | Attending: Cardiology | Admitting: *Deleted

## 2022-08-17 DIAGNOSIS — I513 Intracardiac thrombosis, not elsewhere classified: Secondary | ICD-10-CM

## 2022-08-17 DIAGNOSIS — Z5181 Encounter for therapeutic drug level monitoring: Secondary | ICD-10-CM

## 2022-08-17 LAB — POCT INR: INR: 1.9 — AB (ref 2.0–3.0)

## 2022-08-17 NOTE — Patient Instructions (Signed)
Take warfarin 1 tablet tonight then resume 1/2 tablet daily except 1 tablet on Fridays.  Continue leafy veggies one days a week only. Recheck INR in 6 weeks. [facility] Clinic [phone] Main Number [phone]  5/8-AMIO DECREASED-TAKING 200MG QD EXCEPT NONE ON Sunday.

## 2022-09-05 IMAGING — DX DG CHEST 2V
2 series · 2 of 2 positions shown · non-contrast
Comparison: December 16, 2020.

CLINICAL DATA: Status post defibrillator placement.

EXAM:
CHEST - 2 VIEW

[w chest pa]
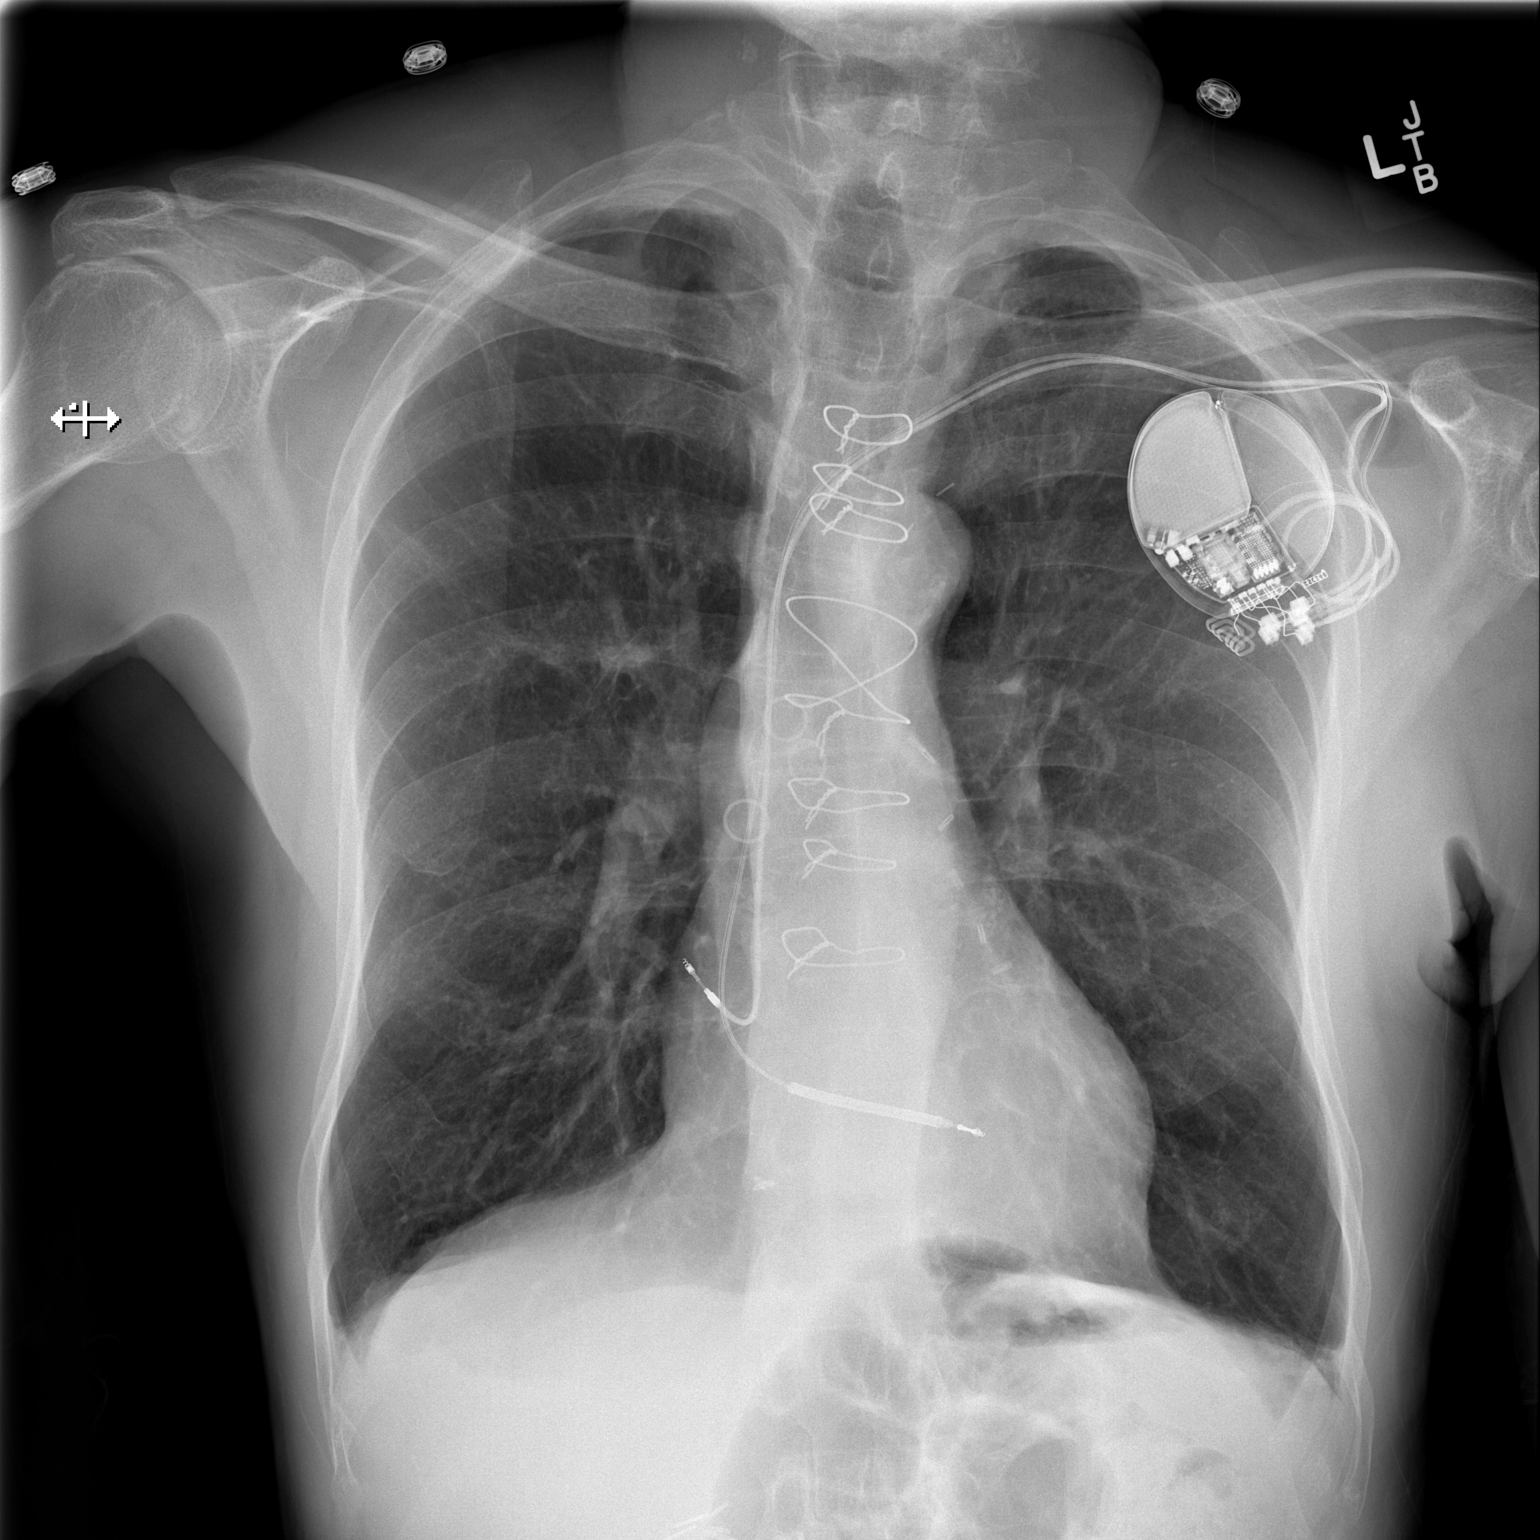

[w chest lat]
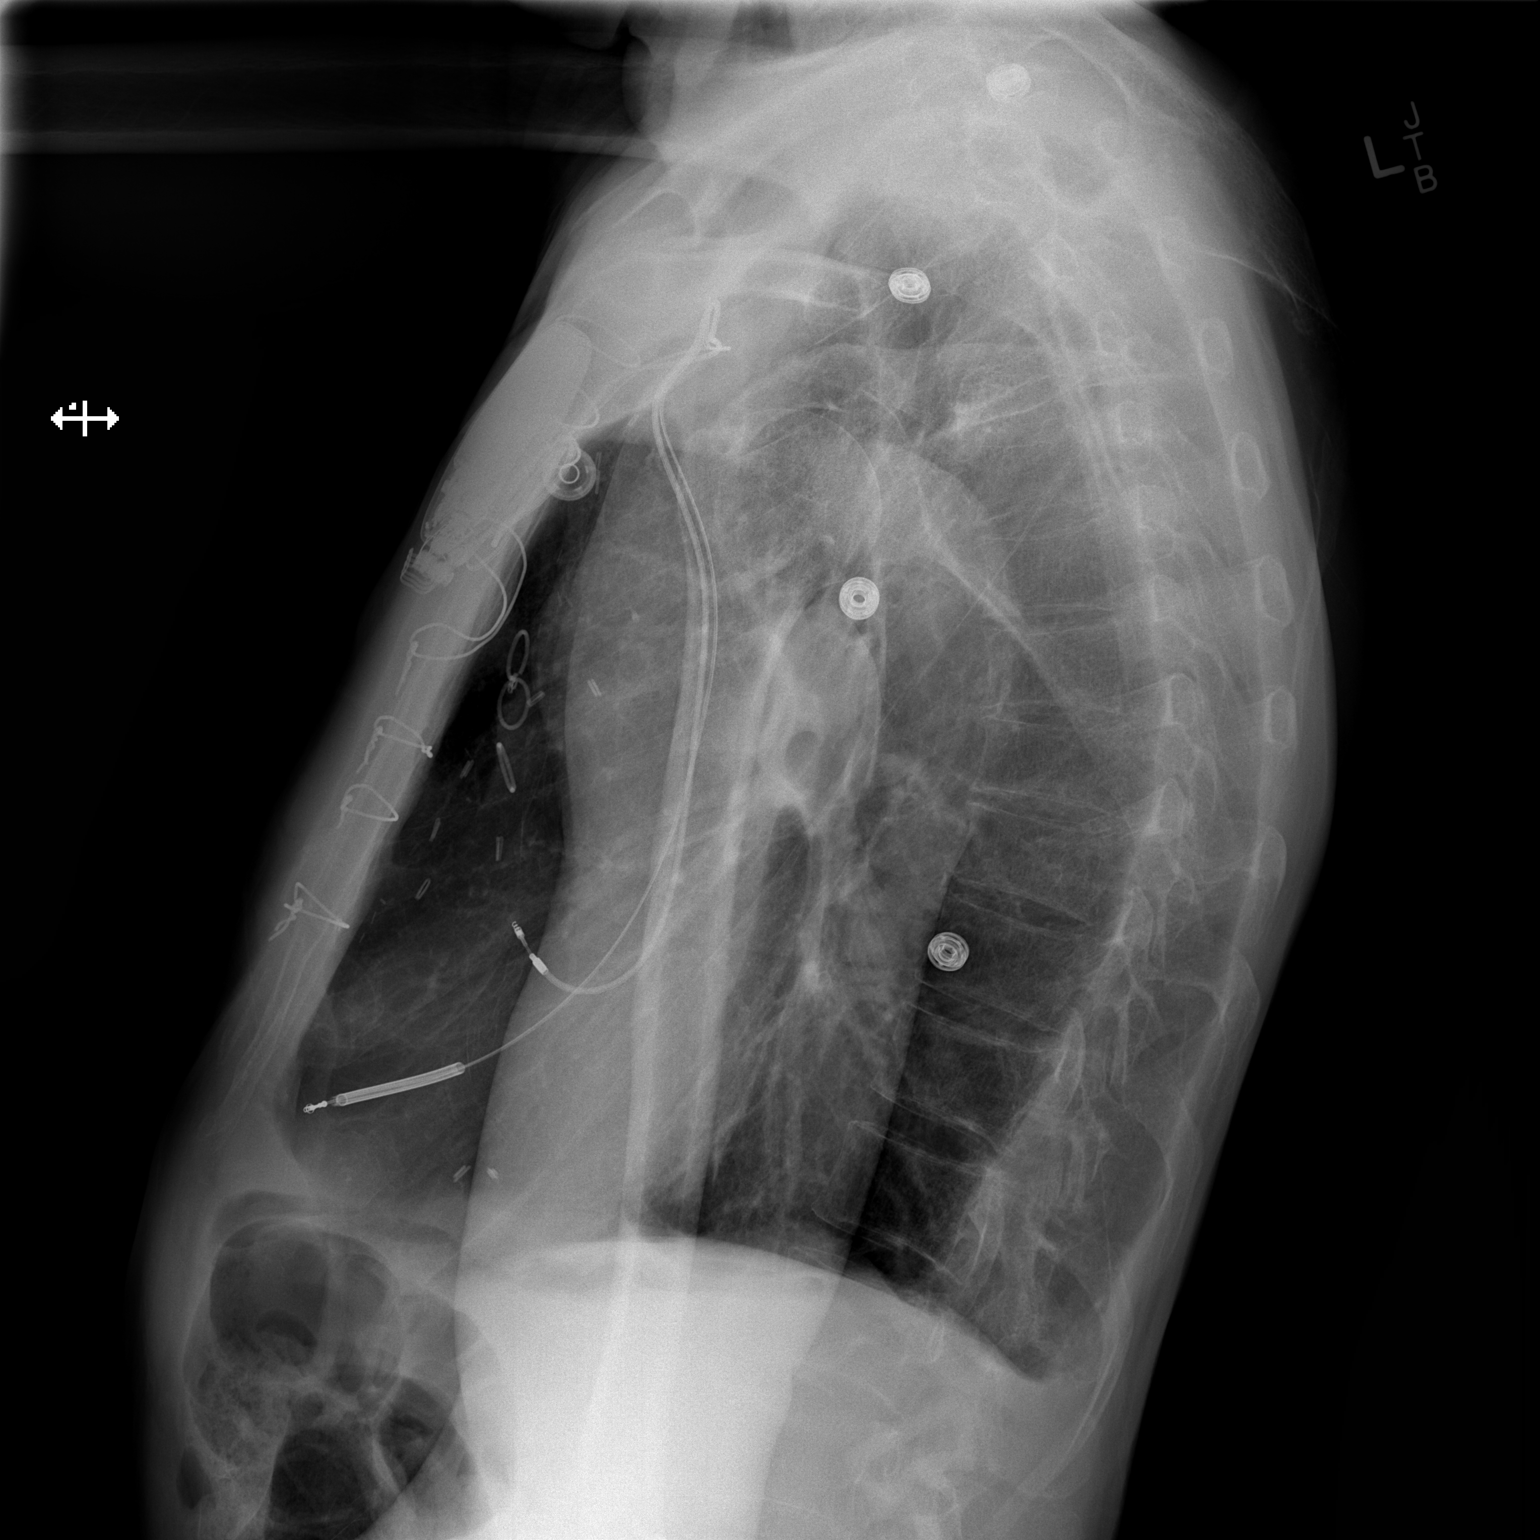

[2 of 2 positions shown; findings below may reference images not displayed]

FINDINGS: The heart size and mediastinal contours are within normal limits.
Status post coronary bypass graft. Interval placement left-sided
defibrillator. No definite pneumothorax is noted. Both lungs are
clear. The visualized skeletal structures are unremarkable.
IMPRESSION: Interval placement of left-sided defibrillator with leads in grossly
good position.

## 2022-09-14 ENCOUNTER — Ambulatory Visit: Payer: Federal, State, Local not specified - PPO | Attending: Cardiology | Admitting: *Deleted

## 2022-09-14 DIAGNOSIS — Z5181 Encounter for therapeutic drug level monitoring: Secondary | ICD-10-CM

## 2022-09-14 DIAGNOSIS — I513 Intracardiac thrombosis, not elsewhere classified: Secondary | ICD-10-CM | POA: Diagnosis not present

## 2022-09-14 LAB — POCT INR: INR: 2 (ref 2.0–3.0)

## 2022-09-14 NOTE — Patient Instructions (Signed)
Description   Take warfarin 1 tablet tonight then resume 1/2 tablet daily except 1 tablet on Fridays.  Continue leafy veggies one day a week only. Recheck INR in 4 weeks. [facility] [phone] Main Number [phone]  5/8-AMIO DECREASED-TAKING 200MG  QD EXCEPT NONE ON Sunday.

## 2022-09-21 NOTE — Progress Notes (Signed)
Remote ICD transmission.   

## 2022-09-26 ENCOUNTER — Other Ambulatory Visit (HOSPITAL_COMMUNITY): Payer: Self-pay | Admitting: Cardiology

## 2022-10-05 ENCOUNTER — Other Ambulatory Visit (HOSPITAL_COMMUNITY): Payer: Self-pay | Admitting: Internal Medicine

## 2022-10-05 DIAGNOSIS — I513 Intracardiac thrombosis, not elsewhere classified: Secondary | ICD-10-CM

## 2022-10-05 NOTE — Telephone Encounter (Signed)
Prescription refill request received for warfarin Lov: Mclean, 12/01/2021 Next INR check: 4/15 Warfarin tablet strength: 5mg 

## 2022-10-12 ENCOUNTER — Ambulatory Visit: Payer: Federal, State, Local not specified - PPO | Attending: Cardiology | Admitting: *Deleted

## 2022-10-12 DIAGNOSIS — Z5181 Encounter for therapeutic drug level monitoring: Secondary | ICD-10-CM

## 2022-10-12 DIAGNOSIS — I513 Intracardiac thrombosis, not elsewhere classified: Secondary | ICD-10-CM

## 2022-10-12 LAB — POCT INR: INR: 1.9 — AB (ref 2.0–3.0)

## 2022-10-12 NOTE — Patient Instructions (Addendum)
Description   Take warfarin 1 tablet tonight then resume 1/2 tablet daily except 1 tablet on Fridays.  Continue leafy veggies one day a week only. Recheck INR in 3 weeks. [facility] [phone] Main Number [phone]  5/8-AMIO DECREASED-TAKING 200MG  QD EXCEPT NONE ON Sunday.

## 2022-11-02 ENCOUNTER — Ambulatory Visit: Payer: Federal, State, Local not specified - PPO | Attending: Cardiology

## 2022-11-02 ENCOUNTER — Ambulatory Visit (INDEPENDENT_AMBULATORY_CARE_PROVIDER_SITE_OTHER): Payer: Federal, State, Local not specified - PPO

## 2022-11-02 DIAGNOSIS — I513 Intracardiac thrombosis, not elsewhere classified: Secondary | ICD-10-CM | POA: Diagnosis not present

## 2022-11-02 DIAGNOSIS — I5022 Chronic systolic (congestive) heart failure: Secondary | ICD-10-CM | POA: Diagnosis not present

## 2022-11-02 DIAGNOSIS — Z5181 Encounter for therapeutic drug level monitoring: Secondary | ICD-10-CM

## 2022-11-02 LAB — CUP PACEART REMOTE DEVICE CHECK
Battery Remaining Longevity: 150 mo
Battery Remaining Percentage: 100 %
Brady Statistic RA Percent Paced: 1 %
Brady Statistic RV Percent Paced: 0 %
Date Time Interrogation Session: 20240506002200
HighPow Impedance: 68 Ohm
Implantable Lead Connection Status: 753985
Implantable Lead Connection Status: 753985
Implantable Lead Implant Date: 20230203
Implantable Lead Implant Date: 20230203
Implantable Lead Location: 753859
Implantable Lead Location: 753860
Implantable Lead Model: 138
Implantable Lead Model: 7841
Implantable Lead Serial Number: 1237694
Implantable Lead Serial Number: 302781
Implantable Pulse Generator Implant Date: 20230203
Lead Channel Impedance Value: 524 Ohm
Lead Channel Impedance Value: 614 Ohm
Lead Channel Pacing Threshold Amplitude: 0.5 V
Lead Channel Pacing Threshold Amplitude: 0.7 V
Lead Channel Pacing Threshold Pulse Width: 0.4 ms
Lead Channel Pacing Threshold Pulse Width: 0.4 ms
Lead Channel Setting Pacing Amplitude: 2 V
Lead Channel Setting Pacing Amplitude: 2.5 V
Lead Channel Setting Pacing Pulse Width: 0.4 ms
Lead Channel Setting Sensing Sensitivity: 0.5 mV
Pulse Gen Serial Number: 233963
Zone Setting Status: 755011

## 2022-11-02 LAB — POCT INR: INR: 2 (ref 2.0–3.0)

## 2022-11-02 NOTE — Patient Instructions (Signed)
Description   Take 1 tablet today, then continue on same dosage 1/2 tablet daily except 1 tablet on Fridays. Continue leafy veggies one day a week only. Recheck INR in 4 weeks. [facility] [phone] Main Number [phone]  5/8-AMIO DECREASED-TAKING 200MG  QD EXCEPT NONE ON Sunday.

## 2022-11-05 ENCOUNTER — Other Ambulatory Visit (HOSPITAL_COMMUNITY): Payer: Self-pay | Admitting: Cardiology

## 2022-11-11 ENCOUNTER — Other Ambulatory Visit (HOSPITAL_COMMUNITY): Payer: Self-pay | Admitting: Cardiology

## 2022-11-23 ENCOUNTER — Other Ambulatory Visit (HOSPITAL_COMMUNITY): Payer: Self-pay | Admitting: Internal Medicine

## 2022-11-23 DIAGNOSIS — I513 Intracardiac thrombosis, not elsewhere classified: Secondary | ICD-10-CM

## 2022-11-24 ENCOUNTER — Other Ambulatory Visit: Payer: Self-pay

## 2022-11-24 DIAGNOSIS — I513 Intracardiac thrombosis, not elsewhere classified: Secondary | ICD-10-CM

## 2022-11-24 MED ORDER — WARFARIN SODIUM 5 MG PO TABS
ORAL_TABLET | ORAL | 1 refills | Status: DC
Start: 2022-11-24 — End: 2022-11-24

## 2022-11-24 MED ORDER — WARFARIN SODIUM 5 MG PO TABS
ORAL_TABLET | ORAL | 1 refills | Status: DC
Start: 2022-11-24 — End: 2023-05-25

## 2022-11-30 ENCOUNTER — Ambulatory Visit: Payer: Federal, State, Local not specified - PPO | Attending: Cardiology | Admitting: *Deleted

## 2022-11-30 DIAGNOSIS — Z5181 Encounter for therapeutic drug level monitoring: Secondary | ICD-10-CM

## 2022-11-30 LAB — POCT INR: POC INR: 1.9

## 2022-11-30 NOTE — Patient Instructions (Addendum)
Description   START taking warfarin 1/2 a tablet daily except for 1 tablet on Mondays and Fridays.  Recheck INR in 3 weeks. Be consistent with 1 serving of greens per week.  [facility] [phone] Main Number [phone]

## 2022-12-02 NOTE — Progress Notes (Signed)
Remote ICD transmission.   

## 2022-12-09 ENCOUNTER — Other Ambulatory Visit (HOSPITAL_COMMUNITY): Payer: Self-pay | Admitting: Cardiology

## 2022-12-11 ENCOUNTER — Other Ambulatory Visit (HOSPITAL_COMMUNITY): Payer: Self-pay

## 2022-12-21 ENCOUNTER — Other Ambulatory Visit (HOSPITAL_COMMUNITY): Payer: Self-pay | Admitting: Cardiology

## 2022-12-21 ENCOUNTER — Telehealth: Payer: Self-pay | Admitting: *Deleted

## 2022-12-21 ENCOUNTER — Ambulatory Visit: Payer: Federal, State, Local not specified - PPO

## 2022-12-21 NOTE — Telephone Encounter (Signed)
Called pt since he missed his Anticoagulation Appt today. Was able to to get him rescheduled to later this week. He was very Adult nurse.

## 2022-12-25 ENCOUNTER — Ambulatory Visit: Payer: Federal, State, Local not specified - PPO | Attending: Cardiovascular Disease

## 2022-12-25 DIAGNOSIS — Z5181 Encounter for therapeutic drug level monitoring: Secondary | ICD-10-CM

## 2022-12-25 LAB — POCT INR: INR: 2.5 (ref 2.0–3.0)

## 2022-12-25 NOTE — Patient Instructions (Signed)
Description   Continue taking warfarin 1/2 a tablet daily except for 1 tablet on Mondays and Fridays.  Recheck INR in 4 weeks.  Be consistent with 1 serving of greens per week.  [facility] [phone] Main Number [phone]

## 2023-01-25 ENCOUNTER — Ambulatory Visit: Payer: Federal, State, Local not specified - PPO

## 2023-01-29 ENCOUNTER — Ambulatory Visit: Payer: Federal, State, Local not specified - PPO

## 2023-01-30 ENCOUNTER — Other Ambulatory Visit (HOSPITAL_COMMUNITY): Payer: Self-pay | Admitting: Internal Medicine

## 2023-02-01 ENCOUNTER — Other Ambulatory Visit (HOSPITAL_COMMUNITY): Payer: Self-pay | Admitting: Cardiology

## 2023-02-01 ENCOUNTER — Ambulatory Visit: Payer: Federal, State, Local not specified - PPO

## 2023-02-01 NOTE — Telephone Encounter (Signed)
This is a CHF pt 

## 2023-02-02 ENCOUNTER — Ambulatory Visit: Payer: Federal, State, Local not specified - PPO | Attending: Cardiovascular Disease | Admitting: *Deleted

## 2023-02-02 DIAGNOSIS — Z5181 Encounter for therapeutic drug level monitoring: Secondary | ICD-10-CM | POA: Diagnosis not present

## 2023-02-02 DIAGNOSIS — I513 Intracardiac thrombosis, not elsewhere classified: Secondary | ICD-10-CM

## 2023-02-02 LAB — POCT INR: INR: 1.9 — AB (ref 2.0–3.0)

## 2023-02-02 NOTE — Patient Instructions (Signed)
Description   Today take 1 tablet of warfarin then continue taking warfarin 1/2 a tablet daily except for 1 tablet on Mondays and Fridays.  Recheck INR in 4 weeks.  Be consistent with 1 serving of greens per week.  [facility] [phone] Main Number [phone]

## 2023-03-01 ENCOUNTER — Other Ambulatory Visit (HOSPITAL_COMMUNITY): Payer: Self-pay | Admitting: Internal Medicine

## 2023-03-05 ENCOUNTER — Ambulatory Visit: Payer: Federal, State, Local not specified - PPO

## 2023-03-08 ENCOUNTER — Ambulatory Visit: Payer: Federal, State, Local not specified - PPO | Attending: Cardiology

## 2023-03-08 DIAGNOSIS — I513 Intracardiac thrombosis, not elsewhere classified: Secondary | ICD-10-CM

## 2023-03-08 DIAGNOSIS — Z5181 Encounter for therapeutic drug level monitoring: Secondary | ICD-10-CM | POA: Diagnosis not present

## 2023-03-08 LAB — POCT INR: INR: 2 (ref 2.0–3.0)

## 2023-03-08 NOTE — Patient Instructions (Signed)
continue taking warfarin 1/2 a tablet daily except for 1 tablet on Mondays and Fridays.  Recheck INR in 4 weeks.  Be consistent with 1 serving of greens per week.  [facility] [phone] Main Number [phone]

## 2023-03-13 ENCOUNTER — Other Ambulatory Visit (HOSPITAL_COMMUNITY): Payer: Self-pay | Admitting: Cardiology

## 2023-03-18 ENCOUNTER — Other Ambulatory Visit (HOSPITAL_COMMUNITY): Payer: Self-pay | Admitting: Cardiology

## 2023-04-04 ENCOUNTER — Other Ambulatory Visit (HOSPITAL_COMMUNITY): Payer: Self-pay | Admitting: Internal Medicine

## 2023-04-05 ENCOUNTER — Ambulatory Visit: Payer: Federal, State, Local not specified - PPO | Attending: Cardiology

## 2023-04-05 DIAGNOSIS — Z5181 Encounter for therapeutic drug level monitoring: Secondary | ICD-10-CM

## 2023-04-05 LAB — POCT INR: INR: 2 (ref 2.0–3.0)

## 2023-04-05 NOTE — Patient Instructions (Signed)
Description   Take 1.5 tablets today and then continue taking warfarin 1/2 a tablet daily except for 1 tablet on Mondays and Fridays.  Recheck INR in 5 weeks.  Be consistent with 1 serving of greens per week.  [facility] [phone] Main Number [phone]

## 2023-04-17 ENCOUNTER — Other Ambulatory Visit (HOSPITAL_COMMUNITY): Payer: Self-pay | Admitting: Cardiology

## 2023-04-26 ENCOUNTER — Other Ambulatory Visit (HOSPITAL_COMMUNITY): Payer: Self-pay | Admitting: Cardiology

## 2023-05-01 ENCOUNTER — Other Ambulatory Visit (HOSPITAL_COMMUNITY): Payer: Self-pay | Admitting: Internal Medicine

## 2023-05-03 ENCOUNTER — Ambulatory Visit (INDEPENDENT_AMBULATORY_CARE_PROVIDER_SITE_OTHER): Payer: Federal, State, Local not specified - PPO

## 2023-05-03 DIAGNOSIS — I5022 Chronic systolic (congestive) heart failure: Secondary | ICD-10-CM

## 2023-05-03 DIAGNOSIS — I255 Ischemic cardiomyopathy: Secondary | ICD-10-CM

## 2023-05-03 LAB — CUP PACEART REMOTE DEVICE CHECK
Battery Remaining Longevity: 144 mo
Battery Remaining Percentage: 100 %
Brady Statistic RA Percent Paced: 1 %
Brady Statistic RV Percent Paced: 0 %
Date Time Interrogation Session: 20241104003400
HighPow Impedance: 68 Ohm
Implantable Lead Connection Status: 753985
Implantable Lead Connection Status: 753985
Implantable Lead Implant Date: 20230203
Implantable Lead Implant Date: 20230203
Implantable Lead Location: 753859
Implantable Lead Location: 753860
Implantable Lead Model: 138
Implantable Lead Model: 7841
Implantable Lead Serial Number: 1237694
Implantable Lead Serial Number: 302781
Implantable Pulse Generator Implant Date: 20230203
Lead Channel Impedance Value: 494 Ohm
Lead Channel Impedance Value: 670 Ohm
Lead Channel Pacing Threshold Amplitude: 0.5 V
Lead Channel Pacing Threshold Amplitude: 0.7 V
Lead Channel Pacing Threshold Pulse Width: 0.4 ms
Lead Channel Pacing Threshold Pulse Width: 0.4 ms
Lead Channel Setting Pacing Amplitude: 2 V
Lead Channel Setting Pacing Amplitude: 2.5 V
Lead Channel Setting Pacing Pulse Width: 0.4 ms
Lead Channel Setting Sensing Sensitivity: 0.5 mV
Pulse Gen Serial Number: 233963
Zone Setting Status: 755011

## 2023-05-10 ENCOUNTER — Ambulatory Visit: Payer: Federal, State, Local not specified - PPO | Attending: Cardiology | Admitting: *Deleted

## 2023-05-10 DIAGNOSIS — I513 Intracardiac thrombosis, not elsewhere classified: Secondary | ICD-10-CM | POA: Diagnosis not present

## 2023-05-10 DIAGNOSIS — Z5181 Encounter for therapeutic drug level monitoring: Secondary | ICD-10-CM | POA: Diagnosis not present

## 2023-05-10 LAB — POCT INR: INR: 2.2 (ref 2.0–3.0)

## 2023-05-10 NOTE — Patient Instructions (Signed)
Description   Continue taking warfarin 1/2 a tablet daily except for 1 tablet on Mondays and Fridays.  Recheck INR in 6 weeks.  Be consistent with 1 serving of greens per week.  [facility] [phone] Main Number [phone]

## 2023-05-25 ENCOUNTER — Other Ambulatory Visit (HOSPITAL_COMMUNITY): Payer: Self-pay | Admitting: Cardiology

## 2023-05-25 ENCOUNTER — Other Ambulatory Visit (HOSPITAL_COMMUNITY): Payer: Self-pay | Admitting: Internal Medicine

## 2023-05-25 DIAGNOSIS — I513 Intracardiac thrombosis, not elsewhere classified: Secondary | ICD-10-CM

## 2023-05-25 NOTE — Telephone Encounter (Signed)
Refill request for warfarin:  Last INR was 2.2 on 05/10/23 Next INR due 06/21/23 LOV was 04/03/22  Pt is past due for MD appt with Dr Shirlee Latch.  Message sent to schedulers to male appt.  Refill approved x 2.

## 2023-05-31 NOTE — Progress Notes (Signed)
Remote ICD transmission.   

## 2023-06-21 ENCOUNTER — Ambulatory Visit: Payer: Federal, State, Local not specified - PPO | Attending: Cardiology

## 2023-06-21 DIAGNOSIS — I513 Intracardiac thrombosis, not elsewhere classified: Secondary | ICD-10-CM

## 2023-06-21 DIAGNOSIS — Z5181 Encounter for therapeutic drug level monitoring: Secondary | ICD-10-CM

## 2023-06-21 LAB — POCT INR: INR: 3.5 — AB (ref 2.0–3.0)

## 2023-06-21 NOTE — Patient Instructions (Signed)
HOLD TODAY ONLY THEN Continue taking warfarin 1/2 a tablet daily except for 1 tablet on Mondays and Fridays.  Recheck INR in 4 weeks.  Be consistent with 1 serving of greens per week.  [facility] [phone] Main Number [phone]

## 2023-07-11 ENCOUNTER — Other Ambulatory Visit (HOSPITAL_COMMUNITY): Payer: Self-pay | Admitting: Cardiology

## 2023-07-11 DIAGNOSIS — I513 Intracardiac thrombosis, not elsewhere classified: Secondary | ICD-10-CM

## 2023-07-19 ENCOUNTER — Ambulatory Visit: Payer: Federal, State, Local not specified - PPO | Attending: Cardiovascular Disease

## 2023-07-19 DIAGNOSIS — I513 Intracardiac thrombosis, not elsewhere classified: Secondary | ICD-10-CM | POA: Diagnosis not present

## 2023-07-19 DIAGNOSIS — Z5181 Encounter for therapeutic drug level monitoring: Secondary | ICD-10-CM | POA: Diagnosis not present

## 2023-07-19 LAB — POCT INR: INR: 2.9 (ref 2.0–3.0)

## 2023-07-19 NOTE — Patient Instructions (Signed)
Continue taking warfarin 1/2 a tablet daily except for 1 tablet on Mondays and Fridays.  Recheck INR in 6 weeks.  Be consistent with 1 serving of greens per week.  [facility] [phone] Main Number [phone]

## 2023-07-24 ENCOUNTER — Other Ambulatory Visit (HOSPITAL_COMMUNITY): Payer: Self-pay | Admitting: Cardiology

## 2023-08-02 ENCOUNTER — Ambulatory Visit (INDEPENDENT_AMBULATORY_CARE_PROVIDER_SITE_OTHER): Payer: Federal, State, Local not specified - PPO

## 2023-08-02 DIAGNOSIS — I255 Ischemic cardiomyopathy: Secondary | ICD-10-CM | POA: Diagnosis not present

## 2023-08-02 DIAGNOSIS — I5022 Chronic systolic (congestive) heart failure: Secondary | ICD-10-CM

## 2023-08-03 ENCOUNTER — Encounter: Payer: Self-pay | Admitting: Internal Medicine

## 2023-08-03 LAB — CUP PACEART REMOTE DEVICE CHECK
Battery Remaining Longevity: 138 mo
Battery Remaining Percentage: 100 %
Brady Statistic RA Percent Paced: 1 %
Brady Statistic RV Percent Paced: 0 %
Date Time Interrogation Session: 20250203012600
HighPow Impedance: 60 Ohm
Implantable Lead Connection Status: 753985
Implantable Lead Connection Status: 753985
Implantable Lead Implant Date: 20230203
Implantable Lead Implant Date: 20230203
Implantable Lead Location: 753859
Implantable Lead Location: 753860
Implantable Lead Model: 138
Implantable Lead Model: 7841
Implantable Lead Serial Number: 1237694
Implantable Lead Serial Number: 302781
Implantable Pulse Generator Implant Date: 20230203
Lead Channel Impedance Value: 539 Ohm
Lead Channel Impedance Value: 686 Ohm
Lead Channel Pacing Threshold Amplitude: 0.4 V
Lead Channel Pacing Threshold Amplitude: 0.7 V
Lead Channel Pacing Threshold Pulse Width: 0.4 ms
Lead Channel Pacing Threshold Pulse Width: 0.4 ms
Lead Channel Setting Pacing Amplitude: 2 V
Lead Channel Setting Pacing Amplitude: 2.5 V
Lead Channel Setting Pacing Pulse Width: 0.4 ms
Lead Channel Setting Sensing Sensitivity: 0.5 mV
Pulse Gen Serial Number: 233963
Zone Setting Status: 755011

## 2023-08-07 ENCOUNTER — Other Ambulatory Visit (HOSPITAL_COMMUNITY): Payer: Self-pay | Admitting: Internal Medicine

## 2023-08-28 ENCOUNTER — Other Ambulatory Visit (HOSPITAL_COMMUNITY): Payer: Self-pay | Admitting: Cardiology

## 2023-08-28 DIAGNOSIS — I513 Intracardiac thrombosis, not elsewhere classified: Secondary | ICD-10-CM

## 2023-08-30 ENCOUNTER — Ambulatory Visit: Payer: Federal, State, Local not specified - PPO | Attending: Cardiology

## 2023-08-30 DIAGNOSIS — I513 Intracardiac thrombosis, not elsewhere classified: Secondary | ICD-10-CM | POA: Diagnosis not present

## 2023-08-30 DIAGNOSIS — Z5181 Encounter for therapeutic drug level monitoring: Secondary | ICD-10-CM | POA: Diagnosis not present

## 2023-08-30 LAB — POCT INR: INR: 2.6 (ref 2.0–3.0)

## 2023-08-30 NOTE — Patient Instructions (Signed)
 Continue taking warfarin 1/2 a tablet daily except for 1 tablet on Mondays and Fridays.  Recheck INR in 6 weeks.  Be consistent with 1 serving of greens per week.  [facility] [phone] Main Number [phone]

## 2023-09-05 ENCOUNTER — Other Ambulatory Visit (HOSPITAL_COMMUNITY): Payer: Self-pay | Admitting: Internal Medicine

## 2023-09-09 ENCOUNTER — Other Ambulatory Visit (HOSPITAL_COMMUNITY): Payer: Self-pay | Admitting: Cardiology

## 2023-09-09 NOTE — Progress Notes (Signed)
 Remote ICD transmission.

## 2023-09-09 NOTE — Addendum Note (Signed)
 Addended by: Geralyn Flash D on: 09/09/2023 10:38 AM   Modules accepted: Orders

## 2023-09-12 ENCOUNTER — Other Ambulatory Visit (HOSPITAL_COMMUNITY): Payer: Self-pay | Admitting: Cardiology

## 2023-09-14 ENCOUNTER — Other Ambulatory Visit (HOSPITAL_COMMUNITY): Payer: Self-pay

## 2023-09-14 DIAGNOSIS — I5020 Unspecified systolic (congestive) heart failure: Secondary | ICD-10-CM

## 2023-09-22 ENCOUNTER — Other Ambulatory Visit (HOSPITAL_COMMUNITY): Payer: Self-pay | Admitting: Cardiology

## 2023-10-03 ENCOUNTER — Other Ambulatory Visit (HOSPITAL_COMMUNITY): Payer: Self-pay | Admitting: Internal Medicine

## 2023-10-11 ENCOUNTER — Ambulatory Visit: Attending: Cardiology

## 2023-10-11 DIAGNOSIS — I513 Intracardiac thrombosis, not elsewhere classified: Secondary | ICD-10-CM

## 2023-10-11 DIAGNOSIS — Z5181 Encounter for therapeutic drug level monitoring: Secondary | ICD-10-CM | POA: Diagnosis not present

## 2023-10-11 LAB — POCT INR: INR: 2.9 (ref 2.0–3.0)

## 2023-10-11 NOTE — Patient Instructions (Signed)
 Continue taking warfarin 1/2 a tablet daily except for 1 tablet on Mondays and Fridays.  Recheck INR in 7 weeks.  Be consistent with 1 serving of greens per week.  [facility] [phone] Main Number [phone]

## 2023-10-17 ENCOUNTER — Other Ambulatory Visit (HOSPITAL_COMMUNITY): Payer: Self-pay | Admitting: Cardiology

## 2023-10-17 DIAGNOSIS — I513 Intracardiac thrombosis, not elsewhere classified: Secondary | ICD-10-CM

## 2023-10-19 NOTE — Telephone Encounter (Signed)
 Warfarin 5mg  refill Thrombus in heart chamber  Last INR 10/11/23 Last OV 04/03/22 & pending appt with Dr Mclean on 10/22/23

## 2023-10-22 ENCOUNTER — Encounter (HOSPITAL_COMMUNITY): Admitting: Cardiology

## 2023-10-22 ENCOUNTER — Ambulatory Visit (HOSPITAL_COMMUNITY)

## 2023-11-01 ENCOUNTER — Ambulatory Visit (INDEPENDENT_AMBULATORY_CARE_PROVIDER_SITE_OTHER): Payer: Federal, State, Local not specified - PPO

## 2023-11-01 DIAGNOSIS — I255 Ischemic cardiomyopathy: Secondary | ICD-10-CM | POA: Diagnosis not present

## 2023-11-01 DIAGNOSIS — I5020 Unspecified systolic (congestive) heart failure: Secondary | ICD-10-CM

## 2023-11-01 LAB — CUP PACEART REMOTE DEVICE CHECK
Battery Remaining Longevity: 138 mo
Battery Remaining Percentage: 100 %
Brady Statistic RA Percent Paced: 1 %
Brady Statistic RV Percent Paced: 0 %
Date Time Interrogation Session: 20250505010200
HighPow Impedance: 62 Ohm
Implantable Lead Connection Status: 753985
Implantable Lead Connection Status: 753985
Implantable Lead Implant Date: 20230203
Implantable Lead Implant Date: 20230203
Implantable Lead Location: 753859
Implantable Lead Location: 753860
Implantable Lead Model: 138
Implantable Lead Model: 7841
Implantable Lead Serial Number: 1237694
Implantable Lead Serial Number: 302781
Implantable Pulse Generator Implant Date: 20230203
Lead Channel Impedance Value: 507 Ohm
Lead Channel Impedance Value: 665 Ohm
Lead Channel Pacing Threshold Amplitude: 0.5 V
Lead Channel Pacing Threshold Amplitude: 0.7 V
Lead Channel Pacing Threshold Pulse Width: 0.4 ms
Lead Channel Pacing Threshold Pulse Width: 0.4 ms
Lead Channel Setting Pacing Amplitude: 2 V
Lead Channel Setting Pacing Amplitude: 2.5 V
Lead Channel Setting Pacing Pulse Width: 0.4 ms
Lead Channel Setting Sensing Sensitivity: 0.5 mV
Pulse Gen Serial Number: 233963
Zone Setting Status: 755011

## 2023-11-02 ENCOUNTER — Encounter: Payer: Self-pay | Admitting: Internal Medicine

## 2023-11-06 ENCOUNTER — Other Ambulatory Visit (HOSPITAL_COMMUNITY): Payer: Self-pay | Admitting: Cardiology

## 2023-11-10 ENCOUNTER — Telehealth (HOSPITAL_COMMUNITY): Payer: Self-pay | Admitting: Cardiology

## 2023-11-10 ENCOUNTER — Other Ambulatory Visit (HOSPITAL_COMMUNITY): Payer: Self-pay | Admitting: Cardiology

## 2023-11-10 NOTE — Telephone Encounter (Signed)
 Front office left patient a message to schedule a f/u appt w/ an echo for Dr. Mitzie Anda. Patient did not answer the telephone call and front office left message for pt to call back.

## 2023-11-16 ENCOUNTER — Other Ambulatory Visit (HOSPITAL_COMMUNITY): Payer: Self-pay | Admitting: Internal Medicine

## 2023-11-19 NOTE — Telephone Encounter (Signed)
 Attempted to contact patient to schedule overdue appt w/ GT/EP APP - no answer, lvmtcb.

## 2023-11-26 NOTE — Telephone Encounter (Signed)
 Pt has appt with [facility] on 6/2. Have asked them to ask pt to schedule an appt with Dr Bensimhon or Carolynne Citron so we can help him get his medications, as no one can reach him on the phone

## 2023-11-29 ENCOUNTER — Ambulatory Visit: Attending: Cardiology

## 2023-11-29 DIAGNOSIS — I513 Intracardiac thrombosis, not elsewhere classified: Secondary | ICD-10-CM | POA: Diagnosis not present

## 2023-11-29 DIAGNOSIS — Z5181 Encounter for therapeutic drug level monitoring: Secondary | ICD-10-CM | POA: Diagnosis not present

## 2023-11-29 LAB — POCT INR: INR: 2.1 (ref 2.0–3.0)

## 2023-11-29 NOTE — Patient Instructions (Signed)
 Continue taking warfarin 1/2 a tablet daily except for 1 tablet on Mondays and Fridays.  Recheck INR in 8 weeks.  Be consistent with 1 serving of greens per week.  [facility] [phone] Main Number [phone]

## 2023-12-03 NOTE — Telephone Encounter (Signed)
 Spoke w/ patient - he has been scheduled with Dr. Carolynne Citron on Monday 6/9!

## 2023-12-04 ENCOUNTER — Other Ambulatory Visit (HOSPITAL_COMMUNITY): Payer: Self-pay | Admitting: Cardiology

## 2023-12-06 ENCOUNTER — Encounter: Admitting: Internal Medicine

## 2023-12-07 NOTE — Telephone Encounter (Signed)
 This is a CHF pt, Dr. Mitzie Anda pt. Please address

## 2023-12-10 ENCOUNTER — Other Ambulatory Visit (HOSPITAL_COMMUNITY): Payer: Self-pay | Admitting: Cardiology

## 2023-12-14 ENCOUNTER — Telehealth (HOSPITAL_COMMUNITY): Payer: Self-pay | Admitting: Cardiology

## 2023-12-14 NOTE — Telephone Encounter (Signed)
 front office left pt a voice mail to schedule an echocardiogram before his f/u office visit with Dr. Mitzie Anda in July 2025. Per [name], CMA, this pt needs an echo before his appt. Instructed pt on voice message to call front office to get scheduled.

## 2023-12-21 NOTE — Progress Notes (Signed)
 Remote ICD transmission.

## 2024-01-03 ENCOUNTER — Other Ambulatory Visit (HOSPITAL_COMMUNITY): Payer: Self-pay | Admitting: Cardiology

## 2024-01-06 ENCOUNTER — Other Ambulatory Visit (HOSPITAL_COMMUNITY): Payer: Self-pay | Admitting: Cardiology

## 2024-01-06 DIAGNOSIS — I513 Intracardiac thrombosis, not elsewhere classified: Secondary | ICD-10-CM

## 2024-01-17 ENCOUNTER — Encounter (HOSPITAL_COMMUNITY): Admitting: Cardiology

## 2024-01-24 ENCOUNTER — Ambulatory Visit (HOSPITAL_COMMUNITY): Admitting: Cardiology

## 2024-01-24 ENCOUNTER — Ambulatory Visit: Attending: Cardiology

## 2024-01-24 DIAGNOSIS — Z5181 Encounter for therapeutic drug level monitoring: Secondary | ICD-10-CM

## 2024-01-24 DIAGNOSIS — I513 Intracardiac thrombosis, not elsewhere classified: Secondary | ICD-10-CM | POA: Diagnosis not present

## 2024-01-24 LAB — POCT INR: INR: 2 (ref 2.0–3.0)

## 2024-01-24 NOTE — Progress Notes (Signed)
 INR 2.0 Please see anticoagulation encounter.

## 2024-01-24 NOTE — Patient Instructions (Signed)
 Take 1.5 tablets today only then Continue taking warfarin 1/2 a tablet daily except for 1 tablet on Mondays and Fridays.  Recheck INR in 8 weeks.  Be consistent with 1 serving of greens per week.  [facility] [phone] Main Number [phone]

## 2024-01-31 ENCOUNTER — Ambulatory Visit: Payer: Federal, State, Local not specified - PPO

## 2024-01-31 DIAGNOSIS — I255 Ischemic cardiomyopathy: Secondary | ICD-10-CM | POA: Diagnosis not present

## 2024-02-02 LAB — CUP PACEART REMOTE DEVICE CHECK
Battery Remaining Longevity: 132 mo
Battery Remaining Percentage: 100 %
Brady Statistic RA Percent Paced: 1 %
Brady Statistic RV Percent Paced: 0 %
Date Time Interrogation Session: 20250804002200
HighPow Impedance: 68 Ohm
Implantable Lead Connection Status: 753985
Implantable Lead Connection Status: 753985
Implantable Lead Implant Date: 20230203
Implantable Lead Implant Date: 20230203
Implantable Lead Location: 753859
Implantable Lead Location: 753860
Implantable Lead Model: 138
Implantable Lead Model: 7841
Implantable Lead Serial Number: 1237694
Implantable Lead Serial Number: 302781
Implantable Pulse Generator Implant Date: 20230203
Lead Channel Impedance Value: 527 Ohm
Lead Channel Impedance Value: 688 Ohm
Lead Channel Pacing Threshold Amplitude: 0.5 V
Lead Channel Pacing Threshold Amplitude: 0.7 V
Lead Channel Pacing Threshold Pulse Width: 0.4 ms
Lead Channel Pacing Threshold Pulse Width: 0.4 ms
Lead Channel Setting Pacing Amplitude: 2 V
Lead Channel Setting Pacing Amplitude: 2.5 V
Lead Channel Setting Pacing Pulse Width: 0.4 ms
Lead Channel Setting Sensing Sensitivity: 0.5 mV
Pulse Gen Serial Number: 233963
Zone Setting Status: 755011

## 2024-02-03 ENCOUNTER — Ambulatory Visit: Payer: Self-pay | Admitting: Internal Medicine

## 2024-03-03 ENCOUNTER — Other Ambulatory Visit (HOSPITAL_COMMUNITY): Payer: Self-pay | Admitting: Cardiology

## 2024-03-06 ENCOUNTER — Other Ambulatory Visit (HOSPITAL_COMMUNITY): Payer: Self-pay | Admitting: Cardiology

## 2024-03-06 ENCOUNTER — Ambulatory Visit (HOSPITAL_COMMUNITY)

## 2024-03-06 ENCOUNTER — Encounter (HOSPITAL_COMMUNITY): Admitting: Cardiology

## 2024-03-09 ENCOUNTER — Other Ambulatory Visit (HOSPITAL_COMMUNITY): Payer: Self-pay | Admitting: Cardiology

## 2024-03-20 ENCOUNTER — Ambulatory Visit: Attending: Cardiology

## 2024-03-20 DIAGNOSIS — I513 Intracardiac thrombosis, not elsewhere classified: Secondary | ICD-10-CM

## 2024-03-20 DIAGNOSIS — Z5181 Encounter for therapeutic drug level monitoring: Secondary | ICD-10-CM

## 2024-03-20 LAB — POCT INR: INR: 2.1 (ref 2.0–3.0)

## 2024-03-20 NOTE — Patient Instructions (Signed)
 Description   INR 2.1,Continue taking warfarin 1/2 a tablet daily except for 1 tablet on Mondays and Fridays.  Recheck INR in 8 weeks.  Be consistent with 1 serving of greens per week.  [facility] [phone] Main Number [phone]

## 2024-03-20 NOTE — Progress Notes (Signed)
 Description   INR 2.1,Continue taking warfarin 1/2 a tablet daily except for 1 tablet on Mondays and Fridays.  Recheck INR in 8 weeks.  Be consistent with 1 serving of greens per week.  [facility] [phone] Main Number [phone]

## 2024-03-27 NOTE — Progress Notes (Signed)
 Remote ICD Transmission

## 2024-03-30 ENCOUNTER — Other Ambulatory Visit (HOSPITAL_COMMUNITY): Payer: Self-pay | Admitting: Cardiology

## 2024-03-30 DIAGNOSIS — I513 Intracardiac thrombosis, not elsewhere classified: Secondary | ICD-10-CM

## 2024-04-02 ENCOUNTER — Other Ambulatory Visit (HOSPITAL_COMMUNITY): Payer: Self-pay | Admitting: Cardiology

## 2024-04-27 ENCOUNTER — Encounter (HOSPITAL_COMMUNITY): Admitting: Cardiology

## 2024-04-27 ENCOUNTER — Ambulatory Visit (HOSPITAL_COMMUNITY)

## 2024-04-29 ENCOUNTER — Other Ambulatory Visit (HOSPITAL_COMMUNITY): Payer: Self-pay | Admitting: Cardiology

## 2024-05-01 ENCOUNTER — Ambulatory Visit: Payer: Federal, State, Local not specified - PPO

## 2024-05-01 DIAGNOSIS — I255 Ischemic cardiomyopathy: Secondary | ICD-10-CM | POA: Diagnosis not present

## 2024-05-01 LAB — CUP PACEART REMOTE DEVICE CHECK
Battery Remaining Longevity: 132 mo
Battery Remaining Percentage: 100 %
Brady Statistic RA Percent Paced: 1 %
Brady Statistic RV Percent Paced: 0 %
Date Time Interrogation Session: 20251103002200
HighPow Impedance: 59 Ohm
Implantable Lead Connection Status: 753985
Implantable Lead Connection Status: 753985
Implantable Lead Implant Date: 20230203
Implantable Lead Implant Date: 20230203
Implantable Lead Location: 753859
Implantable Lead Location: 753860
Implantable Lead Model: 138
Implantable Lead Model: 7841
Implantable Lead Serial Number: 1237694
Implantable Lead Serial Number: 302781
Implantable Pulse Generator Implant Date: 20230203
Lead Channel Impedance Value: 495 Ohm
Lead Channel Impedance Value: 651 Ohm
Lead Channel Pacing Threshold Amplitude: 0.4 V
Lead Channel Pacing Threshold Amplitude: 0.7 V
Lead Channel Pacing Threshold Pulse Width: 0.4 ms
Lead Channel Pacing Threshold Pulse Width: 0.4 ms
Lead Channel Setting Pacing Amplitude: 2 V
Lead Channel Setting Pacing Amplitude: 2.5 V
Lead Channel Setting Pacing Pulse Width: 0.4 ms
Lead Channel Setting Sensing Sensitivity: 0.5 mV
Pulse Gen Serial Number: 233963
Zone Setting Status: 755011

## 2024-05-04 ENCOUNTER — Ambulatory Visit: Payer: Self-pay | Admitting: Internal Medicine

## 2024-05-05 NOTE — Progress Notes (Signed)
 Remote ICD Transmission

## 2024-05-08 ENCOUNTER — Other Ambulatory Visit (HOSPITAL_COMMUNITY): Payer: Self-pay | Admitting: Cardiology

## 2024-05-15 ENCOUNTER — Ambulatory Visit: Attending: Cardiology

## 2024-05-15 DIAGNOSIS — I513 Intracardiac thrombosis, not elsewhere classified: Secondary | ICD-10-CM | POA: Diagnosis not present

## 2024-05-15 DIAGNOSIS — Z5181 Encounter for therapeutic drug level monitoring: Secondary | ICD-10-CM | POA: Diagnosis not present

## 2024-05-15 LAB — POCT INR: INR: 2 (ref 2.0–3.0)

## 2024-05-15 NOTE — Patient Instructions (Signed)
 Continue taking warfarin 1/2 a tablet daily except for 1 tablet on Mondays and Fridays.  Recheck INR in 8 weeks. No greens this week Be consistent with 1 serving of greens per week.  [facility]  Clinic [phone] Main Number [phone]

## 2024-05-15 NOTE — Progress Notes (Signed)
 INR 2.0 Please see anticoagulation encounter Continue taking warfarin 1/2 a tablet daily except for 1 tablet on Mondays and Fridays.  Recheck INR in 8 weeks. No greens this week Be consistent with 1 serving of greens per week.  [facility]  Cl[facility] [phone] Main Number [phone]

## 2024-05-27 ENCOUNTER — Other Ambulatory Visit (HOSPITAL_COMMUNITY): Payer: Self-pay | Admitting: Cardiology

## 2024-05-28 ENCOUNTER — Other Ambulatory Visit (HOSPITAL_COMMUNITY): Payer: Self-pay | Admitting: Cardiology

## 2024-06-06 ENCOUNTER — Other Ambulatory Visit (HOSPITAL_COMMUNITY): Payer: Self-pay | Admitting: Cardiology

## 2024-06-09 ENCOUNTER — Telehealth (HOSPITAL_COMMUNITY): Payer: Self-pay

## 2024-06-09 NOTE — Telephone Encounter (Signed)
 Called to confirm/remind patient of their appointment at the [facility] on 06/09/24.   Appointment:   [x] Confirmed  [] Left mess   [] No answer/No voice mail  [] VM Full/unable to leave message  [] Phone not in service  Patient reminded to bring all medications and/or complete list.  Confirmed patient has transportation. Gave directions, instructed to utilize valet parking.

## 2024-06-12 ENCOUNTER — Encounter (HOSPITAL_COMMUNITY): Payer: Self-pay

## 2024-06-12 ENCOUNTER — Ambulatory Visit (HOSPITAL_COMMUNITY): Admission: RE | Admit: 2024-06-12 | Discharge: 2024-06-12 | Attending: Cardiology

## 2024-06-12 ENCOUNTER — Ambulatory Visit (HOSPITAL_COMMUNITY): Payer: Self-pay | Admitting: Cardiology

## 2024-06-12 VITALS — BP 110/70 | HR 84 | Ht 73.0 in | Wt 162.0 lb

## 2024-06-12 DIAGNOSIS — I255 Ischemic cardiomyopathy: Secondary | ICD-10-CM | POA: Diagnosis not present

## 2024-06-12 DIAGNOSIS — Z7984 Long term (current) use of oral hypoglycemic drugs: Secondary | ICD-10-CM | POA: Diagnosis not present

## 2024-06-12 DIAGNOSIS — E785 Hyperlipidemia, unspecified: Secondary | ICD-10-CM | POA: Diagnosis not present

## 2024-06-12 DIAGNOSIS — I2510 Atherosclerotic heart disease of native coronary artery without angina pectoris: Secondary | ICD-10-CM | POA: Diagnosis not present

## 2024-06-12 DIAGNOSIS — Z7982 Long term (current) use of aspirin: Secondary | ICD-10-CM | POA: Diagnosis not present

## 2024-06-12 DIAGNOSIS — Z87891 Personal history of nicotine dependence: Secondary | ICD-10-CM | POA: Diagnosis not present

## 2024-06-12 DIAGNOSIS — I513 Intracardiac thrombosis, not elsewhere classified: Secondary | ICD-10-CM | POA: Diagnosis not present

## 2024-06-12 DIAGNOSIS — I5022 Chronic systolic (congestive) heart failure: Secondary | ICD-10-CM | POA: Diagnosis not present

## 2024-06-12 DIAGNOSIS — I493 Ventricular premature depolarization: Secondary | ICD-10-CM | POA: Diagnosis not present

## 2024-06-12 DIAGNOSIS — Z7901 Long term (current) use of anticoagulants: Secondary | ICD-10-CM | POA: Diagnosis not present

## 2024-06-12 DIAGNOSIS — I252 Old myocardial infarction: Secondary | ICD-10-CM | POA: Diagnosis not present

## 2024-06-12 DIAGNOSIS — Z951 Presence of aortocoronary bypass graft: Secondary | ICD-10-CM | POA: Diagnosis not present

## 2024-06-12 DIAGNOSIS — D649 Anemia, unspecified: Secondary | ICD-10-CM | POA: Diagnosis not present

## 2024-06-12 DIAGNOSIS — Z95810 Presence of automatic (implantable) cardiac defibrillator: Secondary | ICD-10-CM | POA: Diagnosis not present

## 2024-06-12 DIAGNOSIS — Z79899 Other long term (current) drug therapy: Secondary | ICD-10-CM | POA: Diagnosis not present

## 2024-06-12 LAB — LIPID PANEL
Cholesterol: 122 mg/dL (ref 0–200)
HDL: 48 mg/dL (ref >40)
LDL Cholesterol: 61 mg/dL (ref 0–99)
Total CHOL/HDL Ratio: 2.5 ratio
Triglycerides: 65 mg/dL (ref <150)
VLDL: 13 mg/dL (ref 0–40)

## 2024-06-12 LAB — COMPREHENSIVE METABOLIC PANEL WITH GFR
ALT: 28 U/L (ref 0–44)
AST: 27 U/L (ref 15–41)
Albumin: 3.8 g/dL (ref 3.5–5.0)
Alkaline Phosphatase: 57 U/L (ref 38–126)
Anion gap: 4 — ABNORMAL LOW (ref 5–15)
BUN: 20 mg/dL (ref 8–23)
CO2: 31 mmol/L (ref 22–32)
Calcium: 9.2 mg/dL (ref 8.9–10.3)
Chloride: 107 mmol/L (ref 98–111)
Creatinine, Ser: 1.23 mg/dL (ref 0.61–1.24)
GFR, Estimated: >60 mL/min (ref >60)
Glucose, Bld: 89 mg/dL (ref 70–99)
Potassium: 4.3 mmol/L (ref 3.5–5.1)
Sodium: 142 mmol/L (ref 135–145)
Total Bilirubin: 1 mg/dL (ref 0.0–1.2)
Total Protein: 6.9 g/dL (ref 6.5–8.1)

## 2024-06-12 NOTE — Patient Instructions (Signed)
 Medication Changes:  No Changes In Medications at this time.   Lab Work:  Labs done today, your results will be available in MyChart, we will contact you for abnormal readings.  Follow-Up in: 3 months with an ECHO---PLEASE CALL OUR OFFICE AROUND JANUARY 2026 TO GET SCHEDULED FOR YOUR APPOINTMENT. PHONE NUMBER IS [phone] OPTION 2  At the [facility], you and your health needs are our priority. We have a designated team specialized in the treatment of Heart Failure. This Care Team includes your primary Heart Failure Specialized Cardiologist (physician), Advanced Practice Providers (APPs- Physician Assistants and Nurse Practitioners), and Pharmacist who all work together to provide you with the care you need, when you need it.   You may see any of the following providers on your designated Care Team at your next follow up:  Dr. Toribio Fuel Dr. Ezra Shuck Dr. Odis Brownie Greig Mosses, NP Caffie Shed, [place] [facility] [place], [place] Beckey Coe, NP Jordan Lee, NP Tinnie Redman, PharmD   Please be sure to bring in all your medications bottles to every appointment.   Need to Contact Us :  If you have any questions or concerns before your next appointment please send us  a message through [place] or call our office at [phone].    TO LEAVE A MESSAGE FOR THE NURSE SELECT OPTION 2, PLEASE LEAVE A MESSAGE INCLUDING: YOUR NAME DATE OF BIRTH CALL BACK NUMBER REASON FOR CALL**this is important as we prioritize the call backs  YOU WILL RECEIVE A CALL BACK THE SAME DAY AS LONG AS YOU CALL BEFORE 4:00 PM

## 2024-06-12 NOTE — Progress Notes (Signed)
 Advanced Heart Failure Clinic Note   PCP: Richarda Tobey LABOR, PA (Inactive) Cardiology: Dr. Rolan  CT Surgeon: Dr. Shyrl   Reason for Visit: F/u for Systolic HF   HPI:  73 y.o. AAM w/ h/o anemia, ?prior seizure, former smoker, HLD CAD and systolic heart failure d/t ischemic CM, s/p CABG x 4.   Admitted to [facility] 5/22 for acute CHF and ruled in for NSTEMi.  Diuresed w/ IV lasix  and placed on IV heparin . Echo showed severely reduced LVEF <20% w/ global HK, GIIDD and small LV apical thrombus, moderate MR and RV mildly reduced. R/LHC showed severe MVCAD (angiographic details below) and well compensated hemodynamics w/ low/normal filling pressures and preserved CO. RAP 1 mmHg, PCW 6 mmHg, CO 4.9 L/min, CI 2.5 L/min2. cMRI suggested extensive viability (except for apical septal wall and true apex) + small LV thrombus.   Underwent CABG x 4 by Dr. Shyrl (LIMA-LAD, SVG-D2, SVG-OM1, SVG-PDA). Post-operative course uncomplicated. GDMT initiated prior to d/c. Also of note, LDL was markedly elevated at 204 mg/dL. Was placed on high intensity statin &  Coumadin  for LV thrombus.  Echo in 10/22 showed EF 30-35%, mildly decreased RV systolic function.   Zio patch in 10/22 showed 20% PVCs with short NSVT runs, amiodarone  was started.   Zio patch (3-day) 12/22 showed rare PVCs.  S/p [employer] ICD 2/23.  Today he returns for HF follow up. Not seen in over a year. Doing well. No complaints. NYHA Class I. Denies CP. Euvolemic on exam and device interrogation. HL Score 2. No VT/VF. No AT/AF. He has been off amio ~3-4 months. Remains on coumadin , INR checked at coumadin  clinic. BP well controlled 110/70. He has does not take lasix .    [employer] device interrogation: Heartlogic score 2, no AF, no VT. Activity level 1.9 hr/day   Labs (7/22): K 4.3, creatinine 1.23, LDL 49, TGs 60, digoxin  0.7 Labs (10/22): digoxin  0.8, K 4.4, creatinine 1.09 Labs (1/23): K 4.1, creatinine 1.44, LDL 41 Labs  (3/23): K 4.5, creatinine 1.7 Labs (6/23): K 4.9, creatinine 1.65   PMH: 1. LV thrombus.  2. CAD: NSTEMI in 5/22 with severe 3 vessel disease on cath.  - CABG (5/22) with LIMA-LAD, SVG-D2, SVG-OM1, SVG-PDA.   3. Hyperlipidemia.  4. Chronic systolic CHF: Ischemic cardiomyopathy.  - Echo (5/22): EF <20%, LV severely decreased, Grade II DD, small LV apical thrombus, RV mildly reduced. - cMRI (5/22): Severe LV enlargement with severe global hypokinesis with regional variation. EF 14%, small apical LV thrombus, normal RV size with mildly decreased systolic function, EF 36%, moderate MR, Based on severe LV dysfunction, there was less late gadolinium enhancement than I would have expected. With the exception of the apical septal wall and the true apex, would expect the remainder of the LV myocardium to be viable. - Echo (10/22): EF 30-35%, global hypokinesis, mildly decreased RV systolic function, IVC normal, no LV thrombus. - s/p [employer] ICD 1/23 5. PVCs: Zio monitor (10/22) with 10% PVCs.  - Zio (12/22): rare PVCs 6. CKD stage 3  Review of systems complete and found to be negative unless listed in HPI.     Current Outpatient Medications  Medication Sig Dispense Refill   amiodarone  (PACERONE ) 200 MG tablet Take one tablet by mouth daily Monday through Saturday.  Do NOT take on Sunday. 90 tablet 3   aspirin  EC 81 MG EC tablet Take 1 tablet (81 mg total) by mouth daily. Swallow whole. 30 tablet 11  atorvastatin  (LIPITOR ) 80 MG tablet TAKE 1 TABLET (80 MG TOTAL) BY MOUTH DAILY. PLEASE SCHEDULE FOLLOW UP FOR MORE REFILLS 90 tablet 0   carvedilol  (COREG ) 6.25 MG tablet TAKE 1 TABLET2 TIMES DAILY WITH A MEAL PLEASE SCHEDULE APPOINTMENT FOR REFILLS [phone] OPTION 2 60 tablet 0   dapagliflozin  propanediol (FARXIGA ) 10 MG TABS tablet Take 1 tablet (10 mg total) by mouth daily. NEEDS FOLLOW UP APPOINTMENT FOR MORE REFILLS 30 tablet 6   digoxin  (LANOXIN ) 0.125 MG tablet TAKE 0.5 TABLETS  (0.0625 MG TOTAL) BY MOUTH DAILY. NEEDS FOLLOW UP APPOINTMENT FOR MORE REFILLS 15 tablet 1   ezetimibe  (ZETIA ) 10 MG tablet TAKE 1 TABLET BY MOUTH EVERY DAY 90 tablet 0   furosemide  (LASIX ) 20 MG tablet TAKE 1 TABLET (20 MG TOTAL) BY MOUTH DAILY AS NEEDED FOR FLUID OR EDEMA. 30 tablet 5   Multiple Vitamin (MULTIVITAMIN WITH MINERALS) TABS tablet Take 1 tablet by mouth daily.     sacubitril-valsartan (ENTRESTO ) 24-26 MG Take 1 tablet by mouth 2 (two) times daily. 60 tablet 11   spironolactone  (ALDACTONE ) 25 MG tablet TAKE 1 TABLET BY MOUTH EVERY DAY 90 tablet 3   warfarin (COUMADIN ) 5 MG tablet TAKE 1/2 TABLET TO 1 TABLET ONCE DAILY AS DIRECTED BY THE COUMADIN  CLINIC 30 tablet 2   No current facility-administered medications for this visit.   Allergies  Allergen Reactions   Penicillins Other (See Comments)    childhood   Social History   Socioeconomic History   Marital status: Widowed    Spouse name: Not on file   Number of children: Not on file   Years of education: Not on file   Highest education level: Not on file  Occupational History   Occupation: truck driver  Tobacco Use   Smoking status: Former   Smokeless tobacco: Never   Tobacco comments:    quit in 90's  Vaping Use   Vaping status: Never Used  Substance and Sexual Activity   Alcohol use: Yes    Comment: 1 beer/month   Drug use: No   Sexual activity: Not on file  Other Topics Concern   Not on file  Social History Narrative   Driver for [employer].     Social Drivers of Health   Tobacco Use: Medium Risk (04/03/2022)   Patient History    Smoking Tobacco Use: Former    Smokeless Tobacco Use: Never    Passive Exposure: Not on Actuary Strain: Not on file  Food Insecurity: Not on file  Transportation Needs: Not on file  Physical Activity: Not on file  Stress: Not on file  Social Connections: Not on file  Intimate Partner Violence: Not on file  Depression (EYV7-0): Not on file   Alcohol Screen: Not on file  Housing: Not on file  Utilities: Not on file  Health Literacy: Not on file   Family History  Problem Relation Age of Onset   Cervical cancer Mother    Hypertension Father    Colon cancer Neg Hx    Pancreatic cancer Neg Hx    Esophageal cancer Neg Hx    Stomach cancer Neg Hx    There were no vitals taken for this visit.  Wt Readings from Last 3 Encounters:  04/03/22 72.8 kg (160 lb 9.6 oz)  02/19/22 71.7 kg (158 lb)  12/01/21 73.7 kg (162 lb 6.4 oz)   Physical Exam  There were no vitals filed for this visit. GENERAL: NAD Lungs- clear  CARDIAC:  JVP not elevated          Normal rate with regular rhythm. No MRG. No LEE  ABDOMEN: Soft, non-tender, non-distended.  EXTREMITIES: Warm and well perfused.  NEUROLOGIC: No obvious FND    ASSESSMENT & PLAN: 1. CAD: NSTEMI (5/22). Cath w/ severe multivessel CAD. S/p LIMA-LAD, SVG-D2, SVG-OM1, SVG-PDA.   - Stable w/o CP  - Continue ASA 81 mg daily  - Continue atorvastatin  80 mg daily - Continue Coreg  6.25 mg bid  2. Chronic Systolic Heart Failure: Ischemic CMP. EF < 20% on echo in 5/22. cMRI suggested extensive viability. Now s/p CABG. Echo in 10/22 showed EF 30-35%. S/p [employer] ICD 1/23. NYHA Class I. Euvolemic on exam and by device interrogation. HL Score 2. No VT - Continue Entresto  24-26 mg bid. - Continue spiro 25 mg daily.  - Continue Coreg  6.25 mg bid - Does not need loop diuretic - Check BMP today - Update Echo  3. HLD: LDL goal < 55 - On Zetia  and atorvastatin .  - Check Lipid panel and HF test today  4. LV Thrombus: small thrombus noted on cMRI. - On Coumadin . Followed by [facility].  - will update echo. If EF improved/ no LV thrombus may be able to d/c coumadin .  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 12/22 Zio showed rare PVCs. He has been off amio x 3-4 months. No VT on device interrogation   F/u w/ Dr. Rolan w/ repeat echo in 2 months   Caffie Shed, PA-C 06/12/2024

## 2024-06-25 ENCOUNTER — Other Ambulatory Visit (HOSPITAL_COMMUNITY): Payer: Self-pay | Admitting: Cardiology

## 2024-06-25 DIAGNOSIS — I513 Intracardiac thrombosis, not elsewhere classified: Secondary | ICD-10-CM

## 2024-06-26 NOTE — Telephone Encounter (Signed)
 Refill request for warfarin:  Last INR was 2.0 on 05/15/24 Next INR due 07/10/24 LOV was 06/12/24  Refill approved.

## 2024-06-30 ENCOUNTER — Encounter: Payer: Self-pay | Admitting: Cardiology

## 2024-07-10 ENCOUNTER — Ambulatory Visit

## 2024-07-17 ENCOUNTER — Ambulatory Visit: Attending: Cardiology | Admitting: Pharmacist

## 2024-07-17 DIAGNOSIS — I513 Intracardiac thrombosis, not elsewhere classified: Secondary | ICD-10-CM

## 2024-07-17 DIAGNOSIS — Z5181 Encounter for therapeutic drug level monitoring: Secondary | ICD-10-CM | POA: Diagnosis not present

## 2024-07-17 LAB — POCT INR: INR: 2.1 (ref 2.0–3.0)

## 2024-07-17 NOTE — Patient Instructions (Signed)
 Description   INR 2.1: Continue taking warfarin 1/2 a tablet daily except for 1 tablet on Mondays and Fridays.  Recheck INR in 8 weeks. No greens this week Be consistent with 1 serving of greens per week.  Coumadin  Clinic [phone] Main Number [phone]

## 2024-07-17 NOTE — Progress Notes (Signed)
 Description   INR 2.1: Continue taking warfarin 1/2 a tablet daily except for 1 tablet on Mondays and Fridays.  Recheck INR in 8 weeks. No greens this week Be consistent with 1 serving of greens per week.  Coumadin  Clinic [phone] Main Number [phone]

## 2024-07-23 ENCOUNTER — Other Ambulatory Visit (HOSPITAL_COMMUNITY): Payer: Self-pay | Admitting: Cardiology

## 2024-07-31 ENCOUNTER — Ambulatory Visit: Payer: Federal, State, Local not specified - PPO

## 2024-08-01 LAB — CUP PACEART REMOTE DEVICE CHECK
Battery Remaining Longevity: 120 mo
Battery Remaining Percentage: 100 %
Brady Statistic RA Percent Paced: 1 %
Brady Statistic RV Percent Paced: 0 %
Date Time Interrogation Session: 20260202002100
HighPow Impedance: 71 Ohm
Implantable Lead Connection Status: 753985
Implantable Lead Connection Status: 753985
Implantable Lead Implant Date: 20230203
Implantable Lead Implant Date: 20230203
Implantable Lead Location: 753859
Implantable Lead Location: 753860
Implantable Lead Model: 138
Implantable Lead Model: 7841
Implantable Lead Serial Number: 1237694
Implantable Lead Serial Number: 302781
Implantable Pulse Generator Implant Date: 20230203
Lead Channel Impedance Value: 534 Ohm
Lead Channel Impedance Value: 682 Ohm
Lead Channel Pacing Threshold Amplitude: 0.5 V
Lead Channel Pacing Threshold Amplitude: 0.7 V
Lead Channel Pacing Threshold Pulse Width: 0.4 ms
Lead Channel Pacing Threshold Pulse Width: 0.4 ms
Lead Channel Setting Pacing Amplitude: 2 V
Lead Channel Setting Pacing Amplitude: 2.5 V
Lead Channel Setting Pacing Pulse Width: 0.4 ms
Lead Channel Setting Sensing Sensitivity: 0.5 mV
Pulse Gen Serial Number: 233963
Zone Setting Status: 755011

## 2024-09-11 ENCOUNTER — Ambulatory Visit

## 2024-10-30 ENCOUNTER — Ambulatory Visit

## 2025-01-29 ENCOUNTER — Ambulatory Visit

## 2025-04-30 ENCOUNTER — Ambulatory Visit

## 2025-07-30 ENCOUNTER — Ambulatory Visit

## 2025-10-29 ENCOUNTER — Ambulatory Visit

## 2026-01-28 ENCOUNTER — Ambulatory Visit
